# Patient Record
Sex: Male | Born: 1950
Health system: Southern US, Community
[De-identification: ages and names within clinical notes are randomized; demographics above are authoritative.]

## PROBLEM LIST (undated history)

## (undated) ENCOUNTER — Emergency Department (HOSPITAL_COMMUNITY): Admission: EM | Payer: Medicaid Other | Source: Home / Self Care

## (undated) DIAGNOSIS — C61 Malignant neoplasm of prostate: Secondary | ICD-10-CM

## (undated) DIAGNOSIS — Z87442 Personal history of urinary calculi: Secondary | ICD-10-CM

## (undated) DIAGNOSIS — I4891 Unspecified atrial fibrillation: Secondary | ICD-10-CM

## (undated) DIAGNOSIS — T8859XA Other complications of anesthesia, initial encounter: Secondary | ICD-10-CM

## (undated) DIAGNOSIS — I639 Cerebral infarction, unspecified: Secondary | ICD-10-CM

## (undated) DIAGNOSIS — I10 Essential (primary) hypertension: Secondary | ICD-10-CM

## (undated) DIAGNOSIS — K635 Polyp of colon: Secondary | ICD-10-CM

## (undated) DIAGNOSIS — M1990 Unspecified osteoarthritis, unspecified site: Secondary | ICD-10-CM

## (undated) DIAGNOSIS — N200 Calculus of kidney: Secondary | ICD-10-CM

## (undated) DIAGNOSIS — F17200 Nicotine dependence, unspecified, uncomplicated: Secondary | ICD-10-CM

## (undated) DIAGNOSIS — B1920 Unspecified viral hepatitis C without hepatic coma: Secondary | ICD-10-CM

## (undated) DIAGNOSIS — K7460 Unspecified cirrhosis of liver: Secondary | ICD-10-CM

## (undated) DIAGNOSIS — E669 Obesity, unspecified: Secondary | ICD-10-CM

## (undated) DIAGNOSIS — M170 Bilateral primary osteoarthritis of knee: Secondary | ICD-10-CM

## (undated) DIAGNOSIS — E119 Type 2 diabetes mellitus without complications: Secondary | ICD-10-CM

## (undated) DIAGNOSIS — N400 Enlarged prostate without lower urinary tract symptoms: Secondary | ICD-10-CM

## (undated) HISTORY — PX: COLONOSCOPY: SHX174

## (undated) HISTORY — DX: Bilateral primary osteoarthritis of knee: M17.0

## (undated) HISTORY — DX: Benign prostatic hyperplasia without lower urinary tract symptoms: N40.0

## (undated) HISTORY — DX: Nicotine dependence, unspecified, uncomplicated: F17.200

## (undated) HISTORY — PX: KNEE SURGERY: SHX244

## (undated) HISTORY — DX: Calculus of kidney: N20.0

## (undated) HISTORY — DX: Polyp of colon: K63.5

## (undated) HISTORY — PX: ESOPHAGOGASTRODUODENOSCOPY: SHX1529

## (undated) HISTORY — PX: KIDNEY STONE SURGERY: SHX686

## (undated) HISTORY — DX: Obesity, unspecified: E66.9

## (undated) HISTORY — PX: OTHER SURGICAL HISTORY: SHX169

## (undated) HISTORY — PX: WISDOM TOOTH EXTRACTION: SHX21

## (undated) HISTORY — PX: PILONIDAL CYST EXCISION: SHX744

---

## 1984-07-25 HISTORY — PX: LITHOTRIPSY: SUR834

## 1999-10-11 ENCOUNTER — Emergency Department (HOSPITAL_COMMUNITY): Admission: EM | Admit: 1999-10-11 | Discharge: 1999-10-11 | Payer: Self-pay | Admitting: *Deleted

## 2000-07-05 ENCOUNTER — Emergency Department (HOSPITAL_COMMUNITY): Admission: EM | Admit: 2000-07-05 | Discharge: 2000-07-05 | Payer: Self-pay | Admitting: Emergency Medicine

## 2001-02-04 ENCOUNTER — Emergency Department (HOSPITAL_COMMUNITY): Admission: EM | Admit: 2001-02-04 | Discharge: 2001-02-04 | Payer: Self-pay

## 2013-10-23 ENCOUNTER — Emergency Department (HOSPITAL_COMMUNITY)
Admission: EM | Admit: 2013-10-23 | Discharge: 2013-10-23 | Disposition: A | Discharge status: Home / Self Care | Payer: Medicaid Other | Attending: Emergency Medicine | Admitting: Emergency Medicine

## 2013-10-23 ENCOUNTER — Encounter (HOSPITAL_COMMUNITY): Payer: Self-pay | Admitting: Emergency Medicine

## 2013-10-23 DIAGNOSIS — E119 Type 2 diabetes mellitus without complications: Secondary | ICD-10-CM | POA: Insufficient documentation

## 2013-10-23 DIAGNOSIS — I10 Essential (primary) hypertension: Secondary | ICD-10-CM | POA: Insufficient documentation

## 2013-10-23 DIAGNOSIS — M25561 Pain in right knee: Secondary | ICD-10-CM

## 2013-10-23 DIAGNOSIS — F17200 Nicotine dependence, unspecified, uncomplicated: Secondary | ICD-10-CM | POA: Insufficient documentation

## 2013-10-23 DIAGNOSIS — Z9889 Other specified postprocedural states: Secondary | ICD-10-CM | POA: Insufficient documentation

## 2013-10-23 DIAGNOSIS — M25569 Pain in unspecified knee: Secondary | ICD-10-CM | POA: Insufficient documentation

## 2013-10-23 DIAGNOSIS — M25562 Pain in left knee: Secondary | ICD-10-CM

## 2013-10-23 DIAGNOSIS — M25469 Effusion, unspecified knee: Secondary | ICD-10-CM | POA: Insufficient documentation

## 2013-10-23 DIAGNOSIS — G8929 Other chronic pain: Secondary | ICD-10-CM | POA: Insufficient documentation

## 2013-10-23 HISTORY — DX: Type 2 diabetes mellitus without complications: E11.9

## 2013-10-23 HISTORY — DX: Essential (primary) hypertension: I10

## 2013-10-23 MED ORDER — MELOXICAM 7.5 MG PO TABS: 7.5000 mg | ORAL_TABLET | Freq: Every day | ORAL | Status: DC

## 2013-10-23 MED ORDER — HYDROCODONE-ACETAMINOPHEN 5-325 MG PO TABS: 1.0000 | ORAL_TABLET | Freq: Every evening | ORAL | Status: DC | PRN

## 2013-10-23 NOTE — Discharge Instructions (Signed)
Read the information below.  Use the prescribed medication as directed.  Please discuss all new medications with your pharmacist.  Do not take additional tylenol while taking the prescribed pain medication to avoid overdose.  You may return to the Emergency Department at any time for worsening condition or any new symptoms that concern you.  If you develop uncontrolled pain, weakness or numbness of the extremity, severe discoloration of the skin, or you are unable to walk, return to the ER for a recheck.      Knee Pain Knee pain can be a result of an injury or other medical conditions. Treatment will depend on the cause of your pain. HOME CARE  Only take medicine as told by your doctor.  Keep a healthy weight. Being overweight can make the knee hurt more.  Stretch before exercising or playing sports.  If there is constant knee pain, change the way you exercise. Ask your doctor for advice.  Make sure shoes fit well. Choose the right shoe for the sport or activity.  Protect your knees. Wear kneepads if needed.  Rest when you are tired. GET HELP RIGHT AWAY IF:   Your knee pain does not stop.  Your knee pain does not get better.  Your knee joint feels hot to the touch.  You have a fever. MAKE SURE YOU:   Understand these instructions.  Will watch this condition.  Will get help right away if you are not doing well or get worse. Document Released: 10/07/2008 Document Revised: 10/03/2011 Document Reviewed: 10/07/2008 [facility] Patient Information 2014 [place], [place].  Chronic Pain Chronic pain can be defined as pain that is off and on and lasts for 3 6 months or longer. Many things cause chronic pain, which can make it difficult to make a diagnosis. There are many treatment options available for chronic pain. However, finding a treatment that works well for you may require trying various approaches until the right one is found. Many people benefit from a combination of two or more  types of treatment to control their pain. SYMPTOMS  Chronic pain can occur anywhere in the body and can range from mild to very severe. Some types of chronic pain include:  Headache.  Low back pain.  Cancer pain.  Arthritis pain.  Neurogenic pain. This is pain resulting from damage to nerves. People with chronic pain may also have other symptoms such as:  Depression.  Anger.  Insomnia.  Anxiety. DIAGNOSIS  Your health care provider will help diagnose your condition over time. In many cases, the initial focus will be on excluding possible conditions that could be causing the pain. Depending on your symptoms, your health care provider may order tests to diagnose your condition. Some of these tests may include:   Blood tests.   CT scan.   MRI.   X-rays.   Ultrasounds.   Nerve conduction studies.  You may need to see a specialist.  TREATMENT  Finding treatment that works well may take time. You may be referred to a pain specialist. He or she may prescribe medicine or therapies, such as:   Mindful meditation or yoga.  Shots (injections) of numbing or pain-relieving medicines into the spine or area of pain.  Local electrical stimulation.  Acupuncture.   Massage therapy.   Aroma, color, light, or sound therapy.   Biofeedback.   Working with a physical therapist to keep from getting stiff.   Regular, gentle exercise.   Cognitive or behavioral therapy.   Group support.  Sometimes, surgery may be recommended.  HOME CARE INSTRUCTIONS   Take all medicines as directed by your health care provider.   Lessen stress in your life by relaxing and doing things such as listening to calming music.   Exercise or be active as directed by your health care provider.   Eat a healthy diet and include things such as vegetables, fruits, fish, and lean meats in your diet.   Keep all follow-up appointments with your health care provider.   Attend a  support group with others suffering from chronic pain. SEEK MEDICAL CARE IF:   Your pain gets worse.   You develop a new pain that was not there before.   You cannot tolerate medicines given to you by your health care provider.   You have new symptoms since your last visit with your health care provider.  SEEK IMMEDIATE MEDICAL CARE IF:   You feel weak.   You have decreased sensation or numbness.   You lose control of bowel or bladder function.   Your pain suddenly gets much worse.   You develop shaking.  You develop chills.  You develop confusion.  You develop chest pain.  You develop shortness of breath.  MAKE SURE YOU:  Understand these instructions.  Will watch your condition.  Will get help right away if you are not doing well or get worse. Document Released: 04/02/2002 Document Revised: 03/13/2013 Document Reviewed: 01/04/2013 [facility] Patient Information 2014 [place].   Emergency Department Resource Guide 1) Find a Doctor and Pay Out of Pocket Although you won't have to find out who is covered by your insurance plan, it is a good idea to ask around and get recommendations. You will then need to call the office and see if the doctor you have chosen will accept you as a new patient and what types of options they offer for patients who are self-pay. Some doctors offer discounts or will set up payment plans for their patients who do not have insurance, but you will need to ask so you aren't surprised when you get to your appointment.  2) Contact Your Local Health Department Not all health departments have doctors that can see patients for sick visits, but many do, so it is worth a call to see if yours does. If you don't know where your local health department is, you can check in your phone book. The CDC also has a tool to help you locate your state's health department, and many state websites also have listings of all of their local health  departments.  3) Find a [facility] If your illness is not likely to be very severe or complicated, you may want to try a walk in clinic. These are popping up all over the country in pharmacies, drugstores, and shopping centers. They're usually staffed by nurse practitioners or physician assistants that have been trained to treat common illnesses and complaints. They're usually fairly quick and inexpensive. However, if you have serious medical issues or chronic medical problems, these are probably not your best option.  No Primary Care Doctor: - Call Health Connect at  [phone] - they can help you locate a primary care doctor that  accepts your insurance, provides certain services, etc. - Physician Referral Service- [phone]  Chronic Pain Problems: Organization         Address  Phone   Notes  [facility]  [phone] Patients need to be referred by their primary care doctor.  Medication Assistance: Organization         Address  Phone   Notes  [facility] Medication [facility] [address] [place], [place] [address] [phone] --Must be a resident of [facility] -- Must have NO insurance coverage whatsoever (no Medicaid/ Medicare, etc.) -- The pt. MUST have a primary care doctor that directs their care regularly and follows them in the community   MedAssist  [phone]   [employer]  [phone]    Agencies that provide inexpensive medical care: Organization         Address  Phone   Notes  [place]  [phone]   [facility]    [phone]   [facility] [place], [place] [address] [phone]   [place] [address] [place] [phone]   Planned Parenthood    [phone]   [facility]    [phone]   [place] and [address], Jameica Couts [place] Phone:  [phone], Fax:  [phone] Hours of Operation:  9 am - 6 pm, M-F.  Also accepts Medicaid/Medicare and self-pay.  [facility] for [place], [address], [place] Phone: [phone], Fax: [phone]. Hours of Operation:  8:30 am - 5:30 pm, M-F.  Also accepts Medicaid and self-pay.  [facility] [address], [place] Phone: [phone]   [place], [place], [place] [phone], Ext. [phone] Mondays & Thursdays: 7-9 AM.  First 15 patients are seen on a first come, first serve basis.    [place] Providers:  Organization         Address  Phone   Notes  [facility] [address], [place] [phone] Also accepts self-pay patients.  [facility] [address], [place]  [phone]   [place], [address], [place] [phone]   [facility] [address] [place] [phone]   Lucianne Lei [address], [place]   [phone] Only accepts [place] Access [place] patients after they have their name applied to their card.   Self-Pay (no insurance) in [facility]:  Organization         Address  Phone   Notes  Sickle Cell Patients, [facility] [place] [phone]   [facility] [place] [phone]   [facility] [place]  [place], [place], [place] [phone]   [facility]/Dr. Osei-Bonsu  [address], [place] or [address], [place] [phone] Phone number for both [place] and [place] locations is the same.  Urgent Medical and [facility] [address], [place] [phone]   [facility] [address], [place] or [address] [phone]   [facility] [address], [place] [phone], phone; [phone], fax Sees patients 1st and 3rd Saturday of every month.  Must not qualify for public or private insurance (i.e. Medicaid, Medicare, [place] Health Choice, Veterans' Benefits)  Household income should be no more than 200% of the poverty level The  clinic cannot treat you if you are pregnant or think you are pregnant  Sexually transmitted diseases are not treated at the clinic.    Dental Care: Organization         Address  Phone  Notes  [facility] Department of [facility] [place] [phone] Accepts children up to age 107 who are enrolled in [place] or [place]; pregnant women with a Medicaid card; and children who have applied for Medicaid or [place] Health Choice, but were declined, whose parents can pay a reduced fee at time of service.  [facility] Department of [facility]  [address], [place] [phone] Accepts children up to age 82 who are enrolled in [place] or [place]; pregnant women with a Medicaid card; and children who have applied for Medicaid or [place] Health Choice, but were declined, whose parents can pay a reduced fee at time of service.  [facility] Adult [facility] Access PROGRAM  [place] [phone] Patients are seen by appointment only. Walk-ins are not accepted. [place] will see patients 16 years of age and older. Monday - Tuesday (8am-5pm) Most Wednesdays (8:30-5pm) $30 per visit, cash only  [facility] Adult [facility] Access PROGRAM  [address], [facility] [phone] Patients are seen by appointment only. Walk-ins are not accepted. [place] will see patients 43 years of age and older. One Wednesday Evening (Monthly: Volunteer Based).  $30 per visit, cash only  [place]  [phone] for adults;  Children under age 31, call Graduate Pediatric Dentistry at [phone]. Children aged 56-14, please call [phone] to request a pediatric application.  Dental services are provided in all areas of dental care including fillings, crowns and bridges, complete and partial dentures, implants, gum treatment, root canals, and extractions. Preventive care is also provided. Treatment is provided to both adults and children. Patients are selected via a lottery and there is often a waiting list.   [facility] [address], [place]  [phone] [email or web address]   [facility] [address] [place], [place] [phone], Ext. [phone] Second and Fourth Thursday of each month, opens at 6:30 AM; Clinic ends at 9 AM.  Patients are seen on a first-come first-served basis, and a limited number are seen during each clinic.   [facility]  [address] Hillard Danker [place], [place] [phone]   Eligibility Requirements You must have lived in [place], [place], or [place] counties for at least the last three months.   You cannot be eligible for state or federal sponsored [employer], including [employer], [place], or [employer].   You generally cannot be eligible for healthcare insurance through your employer.    How to apply: Eligibility screenings are held every Tuesday and Wednesday afternoon from 1:00 pm until 4:00 pm. You do not need an appointment for the interview!  [facility] [address], [place], [place]   [place]  [place] Department  [place]  [phone]    Behavioral Health Resources in the Community: Intensive Outpatient Programs Organization         Address  Phone  Notes  [facility] [place]. [address], [place], [place] [phone]   [facility]patient [address], [place],  [place] [phone]   ADS: Alcohol & Drug Svcs [address], [place], [place]   [place] [address]  [place], [place] or [phone]   Substance Abuse Resources Organization         Address  Phone  Notes  Alcohol and Drug Services  [phone]   [place]  [phone]   [facility]   Chinita Pester  [phone]   Residential & Outpatient Substance Abuse Program  [phone]   Psychological Services Organization         Address  Phone  Notes  [facility]  [place]  [phone]   [place] [address], [place] or [phone]    Mobile Crisis Teams Organization         Address  Phone  Notes  [facility], Mobile Crisis Care Unit  [phone]   [facility]otherapeutic [facility]  [address] [place], [place]   [place] [address], [place] [phone]    Self-Help/Support Groups Organization         Address  Phone             Notes  [place]. of [place] - variety of support groups  [place] Call for more information  Narcotics Anonymous (NA), [facility] [address], [employer] [place]  2 meetings at this location   Special educational needs teacher         Address  Phone  Notes  [facility] Residential Treatment [place],    [place]  1-2677239064   [facility]  [address], [place] [id number], [place], [place]   [place], [place] [phone] Admissions: 8am-3pm M-F  Incentives Substance [place] [address]    [place], [place] [phone]   [facility] [address] [place], [place], [place]   [facility] [address]  [place], [place]   [facility]ight Programs - Intensive  Outpatient [address] Kristeen [address], [place], [place]   Community [facility] ( [address] [place].,  [place], [place] [phone] or [phone]   [facility] (RTS) [address] [place], [place] Accepts Medicaid  [facility] [place] [address],  [place] [phone] Substance Abuse/Addiction Treatment   [facility] Organization         Address  Phone  Notes  [facility]  [phone]   Domenic Schwab, PhD [address] Arlis Porta [place], [place]   [phone] or [phone]   [place], [place] [phone]   [facility] [address], [place], [place] [phone] Insurance/Medicaid/sponsorship through [facility] and Families [address]                                    [place], [place] [phone] [place] [address], [place] [phone]    Dr. Adele Schilder  [phone]   [facility] Dept. 1) [address] [place] 2) [place] 3)  [address], [place] [phone]  (  [place] [phone] or [phone] (After Hours)

## 2013-10-23 NOTE — ED Notes (Signed)
Pt had right knee surgery in 1986 per Dr. Carloyn Manner. Now c/o bilateral knee pain, " it's getting worse and worse...sometime it makes me holler". Right knee slightly swollen.

## 2013-10-23 NOTE — ED Notes (Signed)
Bilateral knee pain for months has gotten worse states was told in his forties that he would need need knee replacement but has not had it dr that follows [place]

## 2013-10-23 NOTE — ED Provider Notes (Signed)
CSN: [id number]     Arrival date & time 10/23/13  1213 History   First MD Initiated Contact with Patient 10/23/13 1328     Chief Complaint  Patient presents with  . Knee Pain     (Consider location/radiation/quality/duration/timing/severity/associated sxs/prior Treatment) The history is provided by the patient.    Patient presents with bilateral knee pain, R>L, and right knee swelling.  Pt states that in 1986 he "tore up" his right knee and had surgery on it, was told at the time he would have bad arthritis in both knees by the time he was 42.  Now he has had 2.5 months of gradually worsening pain, exacerbated by activity, and keeping him awake at night.  Pain is described as tightness and aching with occasional sharp stabbing pains.  It is worse after prolonged activity.  Denies any new injury.  Has tried aleve and naproxen without improvement.  Denies fevers, weakness or numbness of the extremities, back pain.    Past Medical History  Diagnosis Date  . Hypertension   . Diabetes mellitus without complication    History reviewed. No pertinent past surgical history. No family history on file. History  Substance Use Topics  . Smoking status: Current Every Day Smoker  . Smokeless tobacco: Not on file  . Alcohol Use: Yes    Review of Systems  Constitutional: Negative for fever.  Respiratory: Negative for shortness of breath.   Cardiovascular: Negative for chest pain.  Musculoskeletal: Positive for arthralgias. Negative for back pain.  Skin: Negative for color change.  Neurological: Negative for weakness and numbness.  All other systems reviewed and are negative.      Allergies  Review of patient's allergies indicates no known allergies.  Home Medications   Current Outpatient Rx  Name  Route  Sig  Dispense  Refill  . HYDROcodone-acetaminophen (NORCO/VICODIN) 5-325 MG per tablet   Oral   Take 1 tablet by mouth at bedtime as needed for moderate pain or severe pain.   10  tablet   0   . meloxicam (MOBIC) 7.5 MG tablet   Oral   Take 1 tablet (7.5 mg total) by mouth daily.   20 tablet   0    BP 160/105  Pulse 86  Temp(Src) 98.4 F (36.9 C) (Oral)  Resp 18  SpO2 100% Physical Exam  Nursing note and vitals reviewed. Constitutional: He appears well-developed and well-nourished. No distress.  HENT:  Head: Normocephalic and atraumatic.  Neck: Neck supple.  Pulmonary/Chest: Effort normal.  Musculoskeletal:       Right knee: He exhibits swelling. He exhibits normal range of motion, no effusion, no ecchymosis, no deformity, no laceration, no erythema, normal alignment, no LCL laxity and no MCL laxity. No tenderness found.       Left knee: Normal.  Neurological: He is alert.  Skin: He is not diaphoretic.    ED Course  Procedures (including critical care time) Labs Review Labs Reviewed - No data to display Imaging Review No results found.   EKG Interpretation None      MDM   Final diagnoses:  Bilateral chronic knee pain    Pt with gradually worsening bilateral knee pain, R>L.  No injury.  No fever.  No red flags. Doubt septic joint, doubt gout.  Suspect degenerative changes/ OA.   D/C home with pain medication, orthopedic follow up.  Discussed findings, treatment, and follow up  with patient.  Pt given return precautions.  Pt verbalizes understanding and agrees with  plan.        Clayton Bibles, PA-C 10/23/13 1630

## 2013-10-24 NOTE — Discharge Planning (Signed)
E1EO Darlyne Russian, Community Liaison  Bradley Hull is [facility] by [facility] as a patient on a primary care basis. Patient went to the practice before coming to the ED but the practice was closed. I did reschedule the patients appointment at the clinic to enroll for his orange card. Appointment made for April 9,2015 at 10:00 am. Patient is aware of this appointment. My contact information was provided for any future questions or concerns.

## 2013-10-25 NOTE — ED Provider Notes (Signed)
Medical screening examination/treatment/procedure(s) were performed by non-physician practitioner and as supervising physician I was immediately available for consultation/collaboration.   EKG Interpretation None        Mervin Kung, MD 10/25/13 1331

## 2013-11-14 DIAGNOSIS — B182 Chronic viral hepatitis C: Secondary | ICD-10-CM | POA: Insufficient documentation

## 2013-12-02 DIAGNOSIS — E669 Obesity, unspecified: Secondary | ICD-10-CM | POA: Insufficient documentation

## 2013-12-02 DIAGNOSIS — F1010 Alcohol abuse, uncomplicated: Secondary | ICD-10-CM | POA: Insufficient documentation

## 2014-07-25 HISTORY — PX: ESOPHAGOGASTRODUODENOSCOPY: SHX1529

## 2015-10-11 ENCOUNTER — Encounter (HOSPITAL_COMMUNITY): Payer: Self-pay | Admitting: Adult Health

## 2015-10-11 ENCOUNTER — Emergency Department (HOSPITAL_COMMUNITY): Payer: Medicaid Other

## 2015-10-11 ENCOUNTER — Emergency Department (HOSPITAL_COMMUNITY)
Admission: EM | Admit: 2015-10-11 | Discharge: 2015-10-11 | Disposition: A | Discharge status: Home / Self Care | Payer: Medicaid Other | Attending: Emergency Medicine | Admitting: Emergency Medicine

## 2015-10-11 DIAGNOSIS — R1011 Right upper quadrant pain: Secondary | ICD-10-CM | POA: Diagnosis present

## 2015-10-11 DIAGNOSIS — I10 Essential (primary) hypertension: Secondary | ICD-10-CM | POA: Insufficient documentation

## 2015-10-11 DIAGNOSIS — Z79899 Other long term (current) drug therapy: Secondary | ICD-10-CM | POA: Insufficient documentation

## 2015-10-11 DIAGNOSIS — M1990 Unspecified osteoarthritis, unspecified site: Secondary | ICD-10-CM | POA: Diagnosis not present

## 2015-10-11 DIAGNOSIS — E119 Type 2 diabetes mellitus without complications: Secondary | ICD-10-CM | POA: Insufficient documentation

## 2015-10-11 DIAGNOSIS — N200 Calculus of kidney: Secondary | ICD-10-CM | POA: Diagnosis not present

## 2015-10-11 DIAGNOSIS — R109 Unspecified abdominal pain: Secondary | ICD-10-CM

## 2015-10-11 DIAGNOSIS — Z8619 Personal history of other infectious and parasitic diseases: Secondary | ICD-10-CM | POA: Diagnosis not present

## 2015-10-11 DIAGNOSIS — F17210 Nicotine dependence, cigarettes, uncomplicated: Secondary | ICD-10-CM | POA: Diagnosis not present

## 2015-10-11 HISTORY — DX: Unspecified cirrhosis of liver: K74.60

## 2015-10-11 HISTORY — DX: Unspecified osteoarthritis, unspecified site: M19.90

## 2015-10-11 HISTORY — DX: Unspecified viral hepatitis C without hepatic coma: B19.20

## 2015-10-11 LAB — COMPREHENSIVE METABOLIC PANEL
ALBUMIN: 3.6 g/dL (ref 3.5–5.0)
ALK PHOS: 76 U/L (ref 38–126)
ALT: 30 U/L (ref 17–63)
ANION GAP: 14 (ref 5–15)
AST: 43 U/L — AB (ref 15–41)
BILIRUBIN TOTAL: 0.9 mg/dL (ref 0.3–1.2)
BUN: 6 mg/dL (ref 6–20)
CALCIUM: 9.1 mg/dL (ref 8.9–10.3)
CO2: 23 mmol/L (ref 22–32)
Chloride: 107 mmol/L (ref 101–111)
Creatinine, Ser: 0.75 mg/dL (ref 0.61–1.24)
GFR calc Af Amer: >60 mL/min (ref >60)
GFR calc non Af Amer: >60 mL/min (ref >60)
GLUCOSE: 190 mg/dL — AB (ref 65–99)
POTASSIUM: 3.9 mmol/L (ref 3.5–5.1)
SODIUM: 144 mmol/L (ref 135–145)
Total Protein: 7.3 g/dL (ref 6.5–8.1)

## 2015-10-11 LAB — URINALYSIS, ROUTINE W REFLEX MICROSCOPIC
BILIRUBIN URINE: NEGATIVE
GLUCOSE, UA: NEGATIVE mg/dL
KETONES UR: 15 mg/dL — AB
Leukocytes, UA: NEGATIVE
Nitrite: NEGATIVE
PH: 5.5 (ref 5.0–8.0)
Protein, ur: NEGATIVE mg/dL
SPECIFIC GRAVITY, URINE: 1.015 (ref 1.005–1.030)

## 2015-10-11 LAB — CBC
HEMATOCRIT: 43.6 % (ref 39.0–52.0)
HEMOGLOBIN: 14.7 g/dL (ref 13.0–17.0)
MCH: 29.2 pg (ref 26.0–34.0)
MCHC: 33.7 g/dL (ref 30.0–36.0)
MCV: 86.5 fL (ref 78.0–100.0)
Platelets: 234 10*3/uL (ref 150–400)
RBC: 5.04 MIL/uL (ref 4.22–5.81)
RDW: 15.5 % (ref 11.5–15.5)
WBC: 6.6 10*3/uL (ref 4.0–10.5)

## 2015-10-11 LAB — URINE MICROSCOPIC-ADD ON

## 2015-10-11 LAB — LIPASE, BLOOD: Lipase: 30 U/L (ref 11–51)

## 2015-10-11 LAB — CBG MONITORING, ED: Glucose-Capillary: 184 mg/dL — ABNORMAL HIGH (ref 65–99)

## 2015-10-11 MED ORDER — SODIUM CHLORIDE 0.9 % IV BOLUS (SEPSIS): 500.0000 mL | Freq: Once | INTRAVENOUS | Status: DC

## 2015-10-11 MED ORDER — KETOROLAC TROMETHAMINE 15 MG/ML IJ SOLN
15.0000 mg | Freq: Once | INTRAMUSCULAR | Status: AC
  Administered 2015-10-11: 15 mg via INTRAVENOUS
  Filled 2015-10-11: qty 1

## 2015-10-11 MED ORDER — HYDROMORPHONE HCL 1 MG/ML IJ SOLN
1.0000 mg | Freq: Once | INTRAMUSCULAR | Status: AC
  Administered 2015-10-11: 1 mg via INTRAMUSCULAR
  Filled 2015-10-11: qty 1

## 2015-10-11 MED ORDER — ONDANSETRON 4 MG PO TBDP
4.0000 mg | ORAL_TABLET | Freq: Once | ORAL | Status: AC
  Administered 2015-10-11: 4 mg via ORAL
  Filled 2015-10-11: qty 1

## 2015-10-11 MED ORDER — ONDANSETRON HCL 4 MG/2ML IJ SOLN: 4.0000 mg | Freq: Once | INTRAMUSCULAR | Status: DC

## 2015-10-11 MED ORDER — HYDROMORPHONE HCL 1 MG/ML IJ SOLN: 1.0000 mg | Freq: Once | INTRAMUSCULAR | Status: DC

## 2015-10-11 MED ORDER — MORPHINE SULFATE 15 MG PO TABS: 15.0000 mg | ORAL_TABLET | Freq: Four times a day (QID) | ORAL | Status: DC | PRN

## 2015-10-11 NOTE — Discharge Instructions (Signed)
Take ibuprofen 600 mg every 6 hrs. If pain severe and only if needed take narcotics.  Stay hydrated with water.  If you were given medicines take as directed.  If you are on coumadin or contraceptives realize their levels and effectiveness is altered by many different medicines.  If you have any reaction (rash, tongues swelling, other) to the medicines stop taking and see a physician.    If your blood pressure was elevated in the ER make sure you follow up for management with a primary doctor or return for chest pain, shortness of breath or stroke symptoms.  Please follow up as directed and return to the ER or see a physician for new or worsening symptoms.  Thank you. Filed Vitals:   10/11/15 1154 10/11/15 1600  BP: 166/100 169/110  Pulse: 90 81  Temp: 98.1 F (36.7 C)   TempSrc: Oral   Resp: 20   Weight: 235 lb 5 oz (106.737 kg)   SpO2: 96% 98%

## 2015-10-11 NOTE — ED Notes (Signed)
Presents with right flank, upper right abdominal pain began a while ago "I am in stage 4 cirrhosis of liver and I used to be a IV drug user. This episode of paiin and vomiting and diarrhea started last night" denies blood and tarry black stools. Thrown up 4-5 times and diarrhea is ongoing for a while. Able to drink okay and hold down fluids.  "I am just hurting too bad and I can't lay down and relax because of the pain"

## 2015-10-11 NOTE — ED Provider Notes (Signed)
CSN: [id number]     Arrival date & time 10/11/15  1109 History   First MD Initiated Contact with Patient 10/11/15 1500     Chief Complaint  Patient presents with  . Abdominal Pain     (Consider location/radiation/quality/duration/timing/severity/associated sxs/prior Treatment) HPI Comments: 65 year old male with history of hepatitis C, cirrhosis stage IV, diabetes presents with worsening right upper abdominal pain for the past 2 months. Worsening the past 2 days with vomiting and diarrhea nonbloody nonbilious. No significant sick contacts. History of IV drug use. Intermittent alcohol use. Patient's follow-up with gastroenterology outpatient. Last endoscopy approximately one year ago denies varices history. No abdominal surgery history. Pain fairly constant.  Patient is a 65 y.o. male presenting with abdominal pain. The history is provided by the patient.  Abdominal Pain Associated symptoms: diarrhea, nausea and vomiting   Associated symptoms: no chest pain, no chills, no dysuria, no fever and no shortness of breath     Past Medical History  Diagnosis Date  . Hypertension   . Diabetes mellitus without complication ([place])   . Cirrhosis ([place])   . Hepatitis C   . Arthritis    History reviewed. No pertinent past surgical history. History reviewed. No pertinent family history. Social History  Substance Use Topics  . Smoking status: Current Every Day Smoker    Types: Cigarettes  . Smokeless tobacco: None  . Alcohol Use: Yes    Review of Systems  Constitutional: Positive for appetite change. Negative for fever and chills.  HENT: Negative for congestion.   Eyes: Negative for visual disturbance.  Respiratory: Negative for shortness of breath.   Cardiovascular: Negative for chest pain.  Gastrointestinal: Positive for nausea, vomiting, abdominal pain and diarrhea.  Genitourinary: Negative for dysuria and flank pain.  Musculoskeletal: Negative for back pain, neck pain and neck  stiffness.  Skin: Negative for rash.  Neurological: Negative for light-headedness and headaches.      Allergies  Review of patient's allergies indicates no known allergies.  Home Medications   Prior to Admission medications   Medication Sig Start Date End Date Taking? Authorizing Provider  lisinopril-hydrochlorothiazide (PRINZIDE,ZESTORETIC) 20-25 MG tablet Take 1 tablet by mouth daily.   Yes Historical Provider, MD  sitaGLIPtin-metformin (JANUMET) 50-500 MG tablet Take 1 tablet by mouth daily.   Yes Historical Provider, MD  HYDROcodone-acetaminophen (NORCO/VICODIN) 5-325 MG per tablet Take 1 tablet by mouth at bedtime as needed for moderate pain or severe pain. 10/23/13   Clayton Bibles, PA-C  meloxicam (MOBIC) 7.5 MG tablet Take 1 tablet (7.5 mg total) by mouth daily. 10/23/13   Clayton Bibles, PA-C  morphine (MSIR) 15 MG tablet Take 1 tablet (15 mg total) by mouth every 6 (six) hours as needed for severe pain. 10/11/15   Elnora Morrison, MD   BP 169/110 mmHg  Pulse 81  Temp(Src) 98.1 F (36.7 C) (Oral)  Resp 20  Wt 235 lb 5 oz (106.737 kg)  SpO2 98% Physical Exam  Constitutional: He is oriented to person, place, and time. He appears well-developed and well-nourished.  HENT:  Head: Normocephalic and atraumatic.  Eyes: Conjunctivae are normal. Right eye exhibits no discharge. Left eye exhibits no discharge.  Neck: Normal range of motion. Neck supple. No tracheal deviation present.  Cardiovascular: Normal rate and regular rhythm.   Pulmonary/Chest: Effort normal and breath sounds normal.  Abdominal: Soft. He exhibits no distension (no ascites appreciated). There is tenderness (RUQ). There is no guarding.  Musculoskeletal: He exhibits no edema.  Neurological: He is alert and  oriented to person, place, and time.  Skin: Skin is warm. No rash noted.  Psychiatric: He has a normal mood and affect.  Nursing note and vitals reviewed.   ED Course  Procedures (including critical care time) Labs  Review Labs Reviewed  COMPREHENSIVE METABOLIC PANEL - Abnormal; Notable for the following:    Glucose, Bld 190 (*)    AST 43 (*)    All other components within normal limits  URINALYSIS, ROUTINE W REFLEX MICROSCOPIC (NOT AT [facility]) - Abnormal; Notable for the following:    APPearance CLOUDY (*)    Hgb urine dipstick LARGE (*)    Ketones, ur 15 (*)    All other components within normal limits  URINE MICROSCOPIC-ADD ON - Abnormal; Notable for the following:    Squamous Epithelial / LPF 0-5 (*)    Bacteria, UA FEW (*)    All other components within normal limits  CBG MONITORING, ED - Abnormal; Notable for the following:    Glucose-Capillary 184 (*)    All other components within normal limits  LIPASE, BLOOD  CBC    Imaging Review Ct Renal Stone Study  10/11/2015  CLINICAL DATA:  Right flank pain.  Cirrhosis. EXAM: CT ABDOMEN AND PELVIS WITHOUT CONTRAST TECHNIQUE: Multidetector CT imaging of the abdomen and pelvis was performed following the standard protocol without IV contrast. COMPARISON:  None. FINDINGS: Lower chest:  Unremarkable Hepatobiliary: Diffuse steatosis. Pancreas: Unremarkable Spleen: Unremarkable Adrenals/Urinary Tract: Right hydronephrosis, hydroureter, and periureteral stranding extending down to a 6 by 4 mm right UVJ calculus. Nonobstructive right kidney lower pole calculi include a 5 mm lower pole calculus, an adjacent 2 mm calculus, and a separate 3 mm calculus. There is a 4 mm calculus in the left kidney upper pole, a 6 mm calculus in the left kidney lower pole, and an adjacent 5 mm calculus in the left kidney lower pole. Hypodense right kidney upper pole lesion, 1.1 cm on image 35 series 2. Hypodense partially exophytic 3.4 by 3.0 cm left mid kidney lesion, image 36 series 2. Stomach/Bowel: Appendix normal. Wall thickening in the lower rectum, probably incidental but technically nonspecific. Vascular/Lymphatic: Aortoiliac atherosclerotic vascular disease. Reproductive:  Enlarged prostate gland 5.5 by 4.7 cm, image 87 series 2. Other: No supplemental non-categorized findings. Musculoskeletal: Lipoma posterior to the right ribcage along the paraspinal musculature, 10.1 by 3.5 by 4.1 cm on image 17 series 2. Lumbar spondylosis and degenerative disc disease with short pedicles and prominent epidural adipose tissue, with impingement at the L2- 3, L3-4, and L4-5 levels. Small sclerotic lesion in the right intertrochanteric region, likely incidental. IMPRESSION: 1. Obstructive 6 by 4 mm right UVJ calculus causing right hydroureter and mild right hydronephrosis. There are also bilateral nonobstructive renal calculi. 2. Bilateral hypodense renal lesions, likely cysts but technically nonspecific. 3. There is some wall thickening in the rectum which is probably incidental. If the patient is having any rectal bleeding then further workup would be suggested. 4. Hepatic steatosis. 5. Enlarged prostate gland. 6.  Aortoiliac atherosclerotic vascular disease. 7. Lumbar spondylosis and degenerative disc disease along with short pedicles, causing impingement at L2- 3, L3-4, and L4-5. Electronically Signed   By: Van Clines M.D.   On: 10/11/2015 17:49   I have personally reviewed and evaluated these images and lab results as part of my medical decision-making.   EKG Interpretation None      MDM   Final diagnoses:  Right kidney stone  Acute right flank pain   Patient presents with worsening  upper abdominal pain, likely related to his cirrhosis. Tender right upper quadrant exam. Normal vitals except for elevated blood pressure which is likely chronic/pain related. He has no jaundice, no vomiting in the room, no fevers. Blood work unremarkable. Plan for CT stone study as patient is had a kidney stone is wellness may feel similar. Likely plan for close outpatient follow with gastroenterology, will look for significant ascites on CT scan.  Patient's pain improved in the ER. Kidney  stone seen on CT scan. Discussed outpatient follow-up with urology. No signs of infection.  Results and differential diagnosis were discussed with the patient/parent/guardian. Xrays were independently reviewed by myself.  Close follow up outpatient was discussed, comfortable with the plan.   Medications  ketorolac (TORADOL) 15 MG/ML injection 15 mg (not administered)  HYDROmorphone (DILAUDID) injection 1 mg (1 mg Intramuscular Given 10/11/15 1758)  ondansetron (ZOFRAN-ODT) disintegrating tablet 4 mg (4 mg Oral Given 10/11/15 1758)    Filed Vitals:   10/11/15 1154 10/11/15 1600  BP: 166/100 169/110  Pulse: 90 81  Temp: 98.1 F (36.7 C)   TempSrc: Oral   Resp: 20   Weight: 235 lb 5 oz (106.737 kg)   SpO2: 96% 98%    Final diagnoses:  Right kidney stone  Acute right flank pain       Elnora Morrison, MD 10/11/15 1845

## 2016-03-01 ENCOUNTER — Other Ambulatory Visit (HOSPITAL_COMMUNITY)
Admission: RE | Admit: 2016-03-01 | Discharge: 2016-03-01 | Disposition: A | Discharge status: Home / Self Care | Payer: Medicaid Other | Source: Ambulatory Visit | Attending: Specialist | Admitting: Specialist

## 2016-03-01 DIAGNOSIS — Z029 Encounter for administrative examinations, unspecified: Secondary | ICD-10-CM | POA: Diagnosis present

## 2016-03-01 LAB — COMPREHENSIVE METABOLIC PANEL
ALBUMIN: 4 g/dL (ref 3.5–5.0)
ALT: 26 U/L (ref 17–63)
AST: 40 U/L (ref 15–41)
Alkaline Phosphatase: 72 U/L (ref 38–126)
Anion gap: 9 (ref 5–15)
BUN: 9 mg/dL (ref 6–20)
CALCIUM: 9.7 mg/dL (ref 8.9–10.3)
CHLORIDE: 106 mmol/L (ref 101–111)
CO2: 24 mmol/L (ref 22–32)
Creatinine, Ser: 0.81 mg/dL (ref 0.61–1.24)
GFR calc Af Amer: >60 mL/min (ref >60)
GFR calc non Af Amer: >60 mL/min (ref >60)
GLUCOSE: 134 mg/dL — AB (ref 65–99)
Potassium: 4.1 mmol/L (ref 3.5–5.1)
SODIUM: 139 mmol/L (ref 135–145)
TOTAL PROTEIN: 7.7 g/dL (ref 6.5–8.1)
Total Bilirubin: 1.3 mg/dL — ABNORMAL HIGH (ref 0.3–1.2)

## 2016-03-01 LAB — PSA: PSA: 1.66 ng/mL (ref 0.00–4.00)

## 2016-03-01 LAB — CBC
HCT: 45.9 % (ref 39.0–52.0)
Hemoglobin: 15.7 g/dL (ref 13.0–17.0)
MCH: 30.8 pg (ref 26.0–34.0)
MCHC: 34.2 g/dL (ref 30.0–36.0)
MCV: 90 fL (ref 78.0–100.0)
PLATELETS: 261 10*3/uL (ref 150–400)
RBC: 5.1 MIL/uL (ref 4.22–5.81)
RDW: 14.2 % (ref 11.5–15.5)
WBC: 9.4 10*3/uL (ref 4.0–10.5)

## 2016-03-01 LAB — TSH: TSH: 1.051 u[IU]/mL (ref 0.350–4.500)

## 2016-03-02 LAB — TESTOSTERONE,FREE AND TOTAL
TESTOSTERONE FREE: 6.2 pg/mL — AB (ref 6.6–18.1)
Testosterone: 360 ng/dL (ref 264–916)

## 2016-04-14 ENCOUNTER — Other Ambulatory Visit (HOSPITAL_COMMUNITY)
Admission: RE | Admit: 2016-04-14 | Discharge: 2016-04-14 | Disposition: A | Discharge status: Home / Self Care | Payer: Medicaid Other | Source: Ambulatory Visit | Attending: Specialist | Admitting: Specialist

## 2016-04-14 DIAGNOSIS — Z029 Encounter for administrative examinations, unspecified: Secondary | ICD-10-CM | POA: Insufficient documentation

## 2016-04-15 LAB — TESTOSTERONE, FREE: Testosterone, Free: 11.2 pg/mL (ref 6.6–18.1)

## 2016-04-16 LAB — MISC LABCORP TEST (SEND OUT): LABCORP TEST CODE: 4549

## 2016-04-18 LAB — MISC LABCORP TEST (SEND OUT): Labcorp test code: 504026

## 2016-09-07 ENCOUNTER — Other Ambulatory Visit: Payer: Self-pay

## 2016-09-07 ENCOUNTER — Encounter (HOSPITAL_COMMUNITY): Payer: Self-pay

## 2016-09-07 ENCOUNTER — Encounter (HOSPITAL_COMMUNITY)
Admission: RE | Admit: 2016-09-07 | Discharge: 2016-09-07 | Disposition: A | Discharge status: Home / Self Care | Payer: Medicaid Other | Source: Ambulatory Visit | Attending: Oral Surgery | Admitting: Oral Surgery

## 2016-09-07 DIAGNOSIS — E119 Type 2 diabetes mellitus without complications: Secondary | ICD-10-CM | POA: Diagnosis not present

## 2016-09-07 DIAGNOSIS — M1990 Unspecified osteoarthritis, unspecified site: Secondary | ICD-10-CM | POA: Diagnosis not present

## 2016-09-07 DIAGNOSIS — K7469 Other cirrhosis of liver: Secondary | ICD-10-CM | POA: Diagnosis not present

## 2016-09-07 DIAGNOSIS — F17200 Nicotine dependence, unspecified, uncomplicated: Secondary | ICD-10-CM | POA: Diagnosis not present

## 2016-09-07 DIAGNOSIS — N200 Calculus of kidney: Secondary | ICD-10-CM | POA: Diagnosis not present

## 2016-09-07 DIAGNOSIS — I10 Essential (primary) hypertension: Secondary | ICD-10-CM | POA: Diagnosis not present

## 2016-09-07 DIAGNOSIS — Z6838 Body mass index (BMI) 38.0-38.9, adult: Secondary | ICD-10-CM | POA: Insufficient documentation

## 2016-09-07 DIAGNOSIS — Z01812 Encounter for preprocedural laboratory examination: Secondary | ICD-10-CM | POA: Diagnosis not present

## 2016-09-07 DIAGNOSIS — Z01810 Encounter for preprocedural cardiovascular examination: Secondary | ICD-10-CM | POA: Insufficient documentation

## 2016-09-07 DIAGNOSIS — K739 Chronic hepatitis, unspecified: Secondary | ICD-10-CM | POA: Insufficient documentation

## 2016-09-07 DIAGNOSIS — E669 Obesity, unspecified: Secondary | ICD-10-CM | POA: Diagnosis not present

## 2016-09-07 HISTORY — DX: Personal history of urinary calculi: Z87.442

## 2016-09-07 LAB — CBC
HEMATOCRIT: 39.7 % (ref 39.0–52.0)
Hemoglobin: 13.4 g/dL (ref 13.0–17.0)
MCH: 29.9 pg (ref 26.0–34.0)
MCHC: 33.8 g/dL (ref 30.0–36.0)
MCV: 88.6 fL (ref 78.0–100.0)
Platelets: 261 10*3/uL (ref 150–400)
RBC: 4.48 MIL/uL (ref 4.22–5.81)
RDW: 13.4 % (ref 11.5–15.5)
WBC: 7.4 10*3/uL (ref 4.0–10.5)

## 2016-09-07 LAB — BASIC METABOLIC PANEL
Anion gap: 10 (ref 5–15)
BUN: 5 mg/dL — AB (ref 6–20)
CHLORIDE: 107 mmol/L (ref 101–111)
CO2: 22 mmol/L (ref 22–32)
CREATININE: 0.76 mg/dL (ref 0.61–1.24)
Calcium: 9.1 mg/dL (ref 8.9–10.3)
GFR calc Af Amer: >60 mL/min (ref >60)
GFR calc non Af Amer: >60 mL/min (ref >60)
GLUCOSE: 311 mg/dL — AB (ref 65–99)
POTASSIUM: 3.9 mmol/L (ref 3.5–5.1)
SODIUM: 139 mmol/L (ref 135–145)

## 2016-09-07 LAB — GLUCOSE, CAPILLARY: Glucose-Capillary: 265 mg/dL — ABNORMAL HIGH (ref 65–99)

## 2016-09-07 NOTE — Progress Notes (Signed)
PCP is Dr Jinny Blossom Denies ever seeing a cardiologist. Denies ever having a card cath, stress test, or echo.  Reports his fasting cbgs run around 165. Today it was 265, but states he just ate.

## 2016-09-07 NOTE — H&P (Signed)
HISTORY AND PHYSICAL  Bradley Hull is a 66 y.o. male patient with CC: painful teeth. Referred by general dentist for full mouth extractions.  No diagnosis found.  Past Medical History:  Diagnosis Date  . Arthritis   . Cirrhosis ([place])   . Diabetes mellitus without complication ([place])   . Hepatitis C   . Hypertension     No current facility-administered medications for this encounter.    Current Outpatient Prescriptions  Medication Sig Dispense Refill  . amLODipine (NORVASC) 5 MG tablet Take 5 mg by mouth every evening.    . carvedilol (COREG) 6.25 MG tablet Take 6.25 mg by mouth 2 (two) times daily with a meal.    . hydrochlorothiazide (HYDRODIURIL) 25 MG tablet Take 25 mg by mouth every evening.    . hydrocortisone 1 % lotion Apply 1 application topically daily as needed for itching.    . insulin lispro (HUMALOG) 100 UNIT/ML injection Inject 5-10 Units into the skin daily as needed for high blood sugar.    . sitaGLIPtin-metformin (JANUMET) 50-500 MG tablet Take 1 tablet by mouth every evening.     . tamsulosin (FLOMAX) 0.4 MG CAPS capsule Take 0.4 mg by mouth daily after supper.     Allergies  Allergen Reactions  . Dilaudid [Hydromorphone Hcl] Itching  . Hydrocodone Itching   Active Problems:   * No active hospital problems. *  Vitals: There were no vitals taken for this visit. Lab results:No results found for this or any previous visit (from the past 53 hour(s)). Radiology Results: No results found. General appearance: alert, cooperative and moderately obese Head: Normocephalic, without obvious abnormality, atraumatic Eyes: negative Nose: Nares normal. Septum midline. Mucosa normal. No drainage or sinus tenderness. Throat: multiple decayed and periodontally involved teeth. bilateral mandibular tori. Pharynx clear, no trismus Neck: no adenopathy, supple, symmetrical, trachea midline and thyroid not enlarged, symmetric, no tenderness/mass/nodules Resp: clear to auscultation  bilaterally Cardio: regular rate and rhythm, S1, S2 normal, no murmur, click, rub or gallop  Radiograph: Panorex reveals 4 cm x 2 cm unilocular radiolucency right mandibular ramus/body associated with impacted tooth # 32. Chronic generalized periodontitis. Impacted tooth #1.  Assessment:Multiple nonrestorable teeth secondary to dental caries and periodontitis. Bilateral mandibular lingual tori. Probable odontogenic cyst right mandible associated with impacted tooth # 32.  Plan: Full mouth extractions with alveoloplasty. Removal bilateral mandibular lingual tori. Removal cyst right mandible with possible bone graft. GA. Day surgery.   Gae Bon 09/07/2016

## 2016-09-07 NOTE — Progress Notes (Signed)
   09/07/16 1019  OBSTRUCTIVE SLEEP APNEA  Have you ever been diagnosed with sleep apnea through a sleep study? No  Do you snore loudly (loud enough to be heard through closed doors)?  0  Do you often feel tired, fatigued, or sleepy during the daytime (such as falling asleep during driving or talking to someone)? 0  Has anyone observed you stop breathing during your sleep? 0  Do you have, or are you being treated for high blood pressure? 1  BMI more than 35 kg/m2? 1  Age > 50 (1-yes) 1  Neck circumference greater than:Male 16 inches or larger, Male 17inches or larger? 1  Male Gender (Yes=1) 1  Obstructive Sleep Apnea Score 5  Score 5 or greater  Results sent to PCP

## 2016-09-07 NOTE — Pre-Procedure Instructions (Signed)
Bradley Hull  09/07/2016      [facility] [place], [place] - [address] [place] [address] Phone: [phone] Fax: [facility] [address] [place] [address] Phone: [phone] Fax: [phone]    Your procedure is scheduled on Feb 16  Report to [place] at 645 A.M.  Call this number if you have problems the morning of surgery:  [phone]   Remember:  Do not eat food or drink liquids after midnight.  Take these medicines the morning of surgery with A SIP OF WATER Amlodipine (Norvasc), Carvedilol (Coreg)    How to Manage Your Diabetes Before and After Surgery  Why is it important to control my blood sugar before and after surgery? . Improving blood sugar levels before and after surgery helps healing and can limit problems. . A way of improving blood sugar control is eating a healthy diet by: o  Eating less sugar and carbohydrates o  Increasing activity/exercise o  Talking with your doctor about reaching your blood sugar goals . High blood sugars (greater than 180 mg/dL) can raise your risk of infections and slow your recovery, so you will need to focus on controlling your diabetes during the weeks before surgery. . Make sure that the doctor who takes care of your diabetes knows about your planned surgery including the date and location.  How do I manage my blood sugar before surgery? . Check your blood sugar at least 4 times a day, starting 2 days before surgery, to make sure that the level is not too high or low. o Check your blood sugar the morning of your surgery when you wake up and every 2 hours until you get to the Short Stay unit. . If your blood sugar is less than 70 mg/dL, you will need to treat for low blood sugar: o Do not take insulin. o Treat a low blood sugar (less than 70 mg/dL) with  cup of clear juice  (cranberry or apple), 4 glucose tablets, OR glucose gel. o Recheck blood sugar in 15 minutes after treatment (to make sure it is greater than 70 mg/dL). If your blood sugar is not greater than 70 mg/dL on recheck, call [phone] for further instructions. . Report your blood sugar to the short stay nurse when you get to Short Stay.  . If you are admitted to the hospital after surgery: o Your blood sugar will be checked by the staff and you will probably be given insulin after surgery (instead of oral diabetes medicines) to make sure you have good blood sugar levels. o The goal for blood sugar control after surgery is 80-180 mg/dL.       WHAT DO I DO ABOUT MY DIABETES MEDICATION?   Marland Kitchen Do not take oral diabetes medicines (pills) the morning of surgery. Sitagliptin (Janumet)   . The day of surgery, do not take other diabetes injectables, including Byetta (exenatide), Bydureon (exenatide ER), Victoza (liraglutide), or Trulicity (dulaglutide).  . If your CBG is greater than 220 mg/dL, you may take  of your sliding scale (correction) dose of insulin.  Other Instructions:          Patient Signature:  Date:   Nurse Signature:  Date:   Reviewed and Endorsed by [facility] Patient Education Committee, August 2015  Do not wear jewelry, make-up or nail polish.  Do not wear lotions, powders, or  perfumes, or deoderant.  Do not shave 48 hours prior to surgery.  Men may shave face and neck.  Do not bring valuables to the hospital.  [facility] is not responsible for any belongings or valuables.  Contacts, dentures or bridgework may not be worn into surgery.  Leave your suitcase in the car.  After surgery it may be brought to your room.  For patients admitted to the hospital, discharge time will be determined by your treatment team.  Patients discharged the day of surgery will not be allowed to drive home.  Special instructions:  [facility] - Preparing for Surgery  Before surgery,  you can play an important role.  Because skin is not sterile, your skin needs to be as free of germs as possible.  You can reduce the number of germs on you skin by washing with CHG (chlorahexidine gluconate) soap before surgery.  CHG is an antiseptic cleaner which kills germs and bonds with the skin to continue killing germs even after washing.  Please DO NOT use if you have an allergy to CHG or antibacterial soaps.  If your skin becomes reddened/irritated stop using the CHG and inform your nurse when you arrive at Short Stay.  Do not shave (including legs and underarms) for at least 48 hours prior to the first CHG shower.  You may shave your face.  Please follow these instructions carefully:   1.  Shower with CHG Soap the night before surgery and the    morning of Surgery.  2.  If you choose to wash your hair, wash your hair first as usual with your   normal shampoo.  3.  After you shampoo, rinse your hair and body thoroughly to remove the  Shampoo.  4.  Use CHG as you would any other liquid soap.  You can apply chg directly to the skin and wash gently with scrungie or a clean washcloth.  5.  Apply the CHG Soap to your body ONLY FROM THE NECK DOWN.    Do not use on open wounds or open sores.  Avoid contact with your eyes,       ears, mouth and genitals (private parts).  Wash genitals (private parts)  with your normal soap.  6.  Wash thoroughly, paying special attention to the area where your surgery  will be performed.  7.  Thoroughly rinse your body with warm water from the neck down.  8.  DO NOT shower/wash with your normal soap after using and rinsing off  the CHG Soap.  9.  Pat yourself dry with a clean towel.            10.  Wear clean pajamas.            11.  Place clean sheets on your bed the night of your first shower and do not  sleep with pets.  Day of Surgery  Do not apply any lotions/deoderants the morning of surgery.  Please wear clean clothes to the hospital/surgery  center.     Please read over the following fact sheets that you were given. Pain Booklet, Coughing and Deep Breathing and Surgical Site Infection Prevention

## 2016-09-08 LAB — HEMOGLOBIN A1C
Hgb A1c MFr Bld: 10.8 % — ABNORMAL HIGH (ref 4.8–5.6)
MEAN PLASMA GLUCOSE: 263 mg/dL

## 2016-09-08 NOTE — Progress Notes (Addendum)
Anesthesia chart review: Patient is a 66 year old male scheduled for multiple teeth extraction, removal bilateral mandibular tori, removal cyst right mandible on 09/09/2016 by Dr. Hoyt Koch.  History includes smoking, hypertension, hepatitis C (s/p Sofosbuvir and RVN, completed treatment 07/04/14 with post-treatment HCV RNA level non-detectable indicating evidence of cure) with cirrhosis, nephrolithiasis, arthritis, diabetes mellitus type 2. BMI is consistent with obesity.  PC[facility] is with [facility]. GI is with [facility], last visit 11/06/15 with Ebony Cargo, DNP/Dr. Rayvon Char.   Meds include amlodipine, Coreg, HCTZ, Humalog, Janumet, Flomax.  BP (!) 152/76   Pulse 77   Temp 36.8 C   Resp 20   Ht 5\' 6"  (1.676 m)   Wt 236 lb 12.8 oz (107.4 kg)   SpO2 95%   BMI 38.22 kg/m   EKG 09/07/16: NSR, LAD.  He denied having prior cardiac testing such as cath, stress test, or echo.  MRI Liver 12/26/15 [facility]; Care Everywhere): IMPRESSION:  -- Cirrhosis with small upper abdominal varices. No washout lesions suggestive of HCC.  -- LR-3 nodule in segment VIII.Recommend follow-up MR of the abdomen in 3-6 months.   EGD 03/26/2015 [facility]; Care Everywhere): Grade I varices were found in the lower third of the esophagus. Moderate portal hypertensive gastropathy was found in the entire examined stomach. The examined duodenum was normal.  Colonoscopy 12/02/13 [facility]; Care Everywhere): Colonic lesion involving ileocecal valve. Five colonic polyps retrieved. + internal hemorrhoids. Biopsy results: Fragments of colonic mucosa with a mild increase in lamina propria lymphocytes and plasma cells and focal crypt branching. No granulomas, viral cytopathic effect, serrated polyp, or dysplasia identified.  Preoperative labs noted. Cr 0.76. Glucose 311 (non-fasting). A1c 10.8, consistent with mean plasma glucose of 263. He reports his fasting CBGs run  around 165. CBC WNL. He has reported cirrhosis, so he will need LFTs and PT/INR done on the day of surgery since these were not included in this PAT labs. (Historically, his LFTS have been normal to only mildly elevated.) Poorly controlled DM/elevated A1c called to Sam at Dr. Lupita Leash office. Patient will also get a fasting CBG. If glucose and LFT results acceptable then I would anticipate that he could proceed as planned. However, if glucose significantly elevated, procedure could get delayed (for treatment) or cancelled.   George Hugh [facility] Short Stay Center/Anesthesiology Phone [phone] 09/08/2016 10:39 AM

## 2016-09-09 ENCOUNTER — Ambulatory Visit (HOSPITAL_COMMUNITY): Payer: Medicaid Other | Admitting: Vascular Surgery

## 2016-09-09 ENCOUNTER — Encounter (HOSPITAL_COMMUNITY)
Admission: RE | Disposition: A | Discharge status: Home / Self Care | Payer: Self-pay | Source: Ambulatory Visit | Attending: Oral Surgery

## 2016-09-09 ENCOUNTER — Ambulatory Visit (HOSPITAL_COMMUNITY)
Admission: RE | Admit: 2016-09-09 | Discharge: 2016-09-09 | Disposition: A | Discharge status: Home / Self Care | Payer: Medicaid Other | Source: Ambulatory Visit | Attending: Oral Surgery | Admitting: Oral Surgery

## 2016-09-09 DIAGNOSIS — E119 Type 2 diabetes mellitus without complications: Secondary | ICD-10-CM | POA: Insufficient documentation

## 2016-09-09 DIAGNOSIS — Z79899 Other long term (current) drug therapy: Secondary | ICD-10-CM | POA: Diagnosis not present

## 2016-09-09 DIAGNOSIS — I10 Essential (primary) hypertension: Secondary | ICD-10-CM | POA: Insufficient documentation

## 2016-09-09 DIAGNOSIS — K0530 Chronic periodontitis, unspecified: Secondary | ICD-10-CM | POA: Insufficient documentation

## 2016-09-09 DIAGNOSIS — Z794 Long term (current) use of insulin: Secondary | ICD-10-CM | POA: Diagnosis not present

## 2016-09-09 DIAGNOSIS — M270 Developmental disorders of jaws: Secondary | ICD-10-CM | POA: Insufficient documentation

## 2016-09-09 DIAGNOSIS — K011 Impacted teeth: Secondary | ICD-10-CM | POA: Insufficient documentation

## 2016-09-09 DIAGNOSIS — M2740 Unspecified cyst of jaw: Secondary | ICD-10-CM | POA: Diagnosis not present

## 2016-09-09 DIAGNOSIS — F17200 Nicotine dependence, unspecified, uncomplicated: Secondary | ICD-10-CM | POA: Diagnosis not present

## 2016-09-09 DIAGNOSIS — K029 Dental caries, unspecified: Secondary | ICD-10-CM | POA: Diagnosis not present

## 2016-09-09 HISTORY — PX: MULTIPLE EXTRACTIONS WITH ALVEOLOPLASTY: SHX5342

## 2016-09-09 LAB — GLUCOSE, CAPILLARY
GLUCOSE-CAPILLARY: 121 mg/dL — AB (ref 65–99)
Glucose-Capillary: 153 mg/dL — ABNORMAL HIGH (ref 65–99)

## 2016-09-09 LAB — PROTIME-INR
INR: 1.11
PROTHROMBIN TIME: 14.3 s (ref 11.4–15.2)

## 2016-09-09 LAB — HEPATIC FUNCTION PANEL
ALBUMIN: 3.6 g/dL (ref 3.5–5.0)
ALT: 24 U/L (ref 17–63)
AST: 29 U/L (ref 15–41)
Alkaline Phosphatase: 69 U/L (ref 38–126)
BILIRUBIN TOTAL: 1.1 mg/dL (ref 0.3–1.2)
Bilirubin, Direct: 0.2 mg/dL (ref 0.1–0.5)
Indirect Bilirubin: 0.9 mg/dL (ref 0.3–0.9)
TOTAL PROTEIN: 7.1 g/dL (ref 6.5–8.1)

## 2016-09-09 SURGERY — MULTIPLE EXTRACTION WITH ALVEOLOPLASTY
Anesthesia: General

## 2016-09-09 MED ORDER — CLINDAMYCIN PHOSPHATE 600 MG/50ML IV SOLN
600.0000 mg | Freq: Four times a day (QID) | INTRAVENOUS | Status: AC
  Administered 2016-09-09: 600 mg via INTRAVENOUS
  Filled 2016-09-09: qty 50

## 2016-09-09 MED ORDER — FENTANYL CITRATE (PF) 100 MCG/2ML IJ SOLN
INTRAMUSCULAR | Status: DC | PRN
  Administered 2016-09-09 (×2): 50 ug via INTRAVENOUS
  Administered 2016-09-09: 100 ug via INTRAVENOUS

## 2016-09-09 MED ORDER — PROPOFOL 10 MG/ML IV BOLUS
INTRAVENOUS | Status: AC
  Filled 2016-09-09: qty 20

## 2016-09-09 MED ORDER — MIDAZOLAM HCL 2 MG/2ML IJ SOLN
INTRAMUSCULAR | Status: DC | PRN
  Administered 2016-09-09: 2 mg via INTRAVENOUS

## 2016-09-09 MED ORDER — CLINDAMYCIN HCL 300 MG PO CAPS: 300.0000 mg | ORAL_CAPSULE | Freq: Three times a day (TID) | ORAL | 0 refills | Status: DC

## 2016-09-09 MED ORDER — 0.9 % SODIUM CHLORIDE (POUR BTL) OPTIME
TOPICAL | Status: DC | PRN
  Administered 2016-09-09: 1000 mL

## 2016-09-09 MED ORDER — OXYMETAZOLINE HCL 0.05 % NA SOLN
NASAL | Status: AC
  Filled 2016-09-09: qty 15

## 2016-09-09 MED ORDER — FENTANYL CITRATE (PF) 100 MCG/2ML IJ SOLN
INTRAMUSCULAR | Status: AC
  Filled 2016-09-09: qty 2

## 2016-09-09 MED ORDER — LABETALOL HCL 5 MG/ML IV SOLN
INTRAVENOUS | Status: AC
  Filled 2016-09-09: qty 4

## 2016-09-09 MED ORDER — OXYMETAZOLINE HCL 0.05 % NA SOLN
NASAL | Status: DC | PRN
  Administered 2016-09-09: 1

## 2016-09-09 MED ORDER — OXYCODONE HCL 5 MG/5ML PO SOLN
ORAL | Status: DC
Start: 2016-09-09 — End: 2016-09-09
  Filled 2016-09-09: qty 5

## 2016-09-09 MED ORDER — CHLORHEXIDINE GLUCONATE CLOTH 2 % EX PADS: 6.0000 | MEDICATED_PAD | Freq: Once | CUTANEOUS | Status: DC

## 2016-09-09 MED ORDER — LABETALOL HCL 5 MG/ML IV SOLN
10.0000 mg | INTRAVENOUS | Status: DC | PRN
  Administered 2016-09-09: 10 mg via INTRAVENOUS

## 2016-09-09 MED ORDER — LIDOCAINE 2% (20 MG/ML) 5 ML SYRINGE
INTRAMUSCULAR | Status: AC
  Filled 2016-09-09: qty 5

## 2016-09-09 MED ORDER — SODIUM CHLORIDE 0.9 % IR SOLN
Status: DC | PRN
  Administered 2016-09-09: 1000 mL

## 2016-09-09 MED ORDER — ONDANSETRON HCL 4 MG/2ML IJ SOLN: 4.0000 mg | Freq: Four times a day (QID) | INTRAMUSCULAR | Status: DC | PRN

## 2016-09-09 MED ORDER — ONDANSETRON HCL 4 MG/2ML IJ SOLN
INTRAMUSCULAR | Status: AC
  Filled 2016-09-09: qty 2

## 2016-09-09 MED ORDER — EPHEDRINE 5 MG/ML INJ
INTRAVENOUS | Status: AC
  Filled 2016-09-09: qty 10

## 2016-09-09 MED ORDER — PHENYLEPHRINE 40 MCG/ML (10ML) SYRINGE FOR IV PUSH (FOR BLOOD PRESSURE SUPPORT)
PREFILLED_SYRINGE | INTRAVENOUS | Status: AC
  Filled 2016-09-09: qty 10

## 2016-09-09 MED ORDER — GLYCOPYRROLATE 0.2 MG/ML IJ SOLN
INTRAMUSCULAR | Status: DC | PRN
  Administered 2016-09-09: 0.2 mg via INTRAVENOUS

## 2016-09-09 MED ORDER — LIDOCAINE-EPINEPHRINE (PF) 2 %-1:200000 IJ SOLN
INTRAMUSCULAR | Status: DC | PRN
  Administered 2016-09-09: 20 mL via INTRADERMAL

## 2016-09-09 MED ORDER — PROPOFOL 10 MG/ML IV BOLUS
INTRAVENOUS | Status: DC | PRN
  Administered 2016-09-09: 200 mg via INTRAVENOUS

## 2016-09-09 MED ORDER — EPHEDRINE SULFATE 50 MG/ML IJ SOLN
INTRAMUSCULAR | Status: DC | PRN
  Administered 2016-09-09 (×4): 5 mg via INTRAVENOUS

## 2016-09-09 MED ORDER — OXYMETAZOLINE HCL 0.05 % NA SOLN
NASAL | Status: DC | PRN
  Administered 2016-09-09: 2 via NASAL

## 2016-09-09 MED ORDER — ONDANSETRON HCL 4 MG/2ML IJ SOLN
INTRAMUSCULAR | Status: DC | PRN
  Administered 2016-09-09: 4 mg via INTRAVENOUS

## 2016-09-09 MED ORDER — OXYCODONE HCL 5 MG/5ML PO SOLN
5.0000 mg | Freq: Once | ORAL | Status: AC | PRN
  Administered 2016-09-09: 5 mg via ORAL

## 2016-09-09 MED ORDER — DIPHENHYDRAMINE HCL 25 MG PO CAPS: ORAL_CAPSULE | ORAL | 0 refills | Status: DC

## 2016-09-09 MED ORDER — LACTATED RINGERS IV SOLN
INTRAVENOUS | Status: DC
  Administered 2016-09-09: 11:00:00 via INTRAVENOUS
  Administered 2016-09-09: 10 mL/h via INTRAVENOUS

## 2016-09-09 MED ORDER — SUCCINYLCHOLINE CHLORIDE 20 MG/ML IJ SOLN
INTRAMUSCULAR | Status: DC | PRN
  Administered 2016-09-09: 100 mg via INTRAVENOUS

## 2016-09-09 MED ORDER — FENTANYL CITRATE (PF) 100 MCG/2ML IJ SOLN
25.0000 ug | INTRAMUSCULAR | Status: DC | PRN
  Administered 2016-09-09 (×4): 25 ug via INTRAVENOUS

## 2016-09-09 MED ORDER — OXYCODONE HCL 5 MG PO TABS: 5.0000 mg | ORAL_TABLET | Freq: Once | ORAL | Status: AC | PRN

## 2016-09-09 MED ORDER — PHENYLEPHRINE HCL 10 MG/ML IJ SOLN
INTRAMUSCULAR | Status: DC | PRN
  Administered 2016-09-09: 80 ug via INTRAVENOUS
  Administered 2016-09-09 (×2): 120 ug via INTRAVENOUS
  Administered 2016-09-09: 80 ug via INTRAVENOUS

## 2016-09-09 MED ORDER — OXYCODONE-ACETAMINOPHEN 10-325 MG PO TABS: 1.0000 | ORAL_TABLET | ORAL | 0 refills | Status: DC | PRN

## 2016-09-09 MED ORDER — MIDAZOLAM HCL 2 MG/2ML IJ SOLN
INTRAMUSCULAR | Status: AC
  Filled 2016-09-09: qty 2

## 2016-09-09 MED ORDER — LIDOCAINE HCL (CARDIAC) 20 MG/ML IV SOLN
INTRAVENOUS | Status: DC | PRN
  Administered 2016-09-09: 60 mg via INTRATRACHEAL

## 2016-09-09 SURGICAL SUPPLY — 47 items
BLADE 10 SAFETY STRL DISP (BLADE) ×2 IMPLANT
BLADE SURG 15 STRL LF DISP TIS (BLADE) ×1 IMPLANT
BLADE SURG 15 STRL SS (BLADE) ×2
BONE CANC CHIPS 20CC PCAN1/4 (Bone Implant) ×2 IMPLANT
BUR CROSS CUT (BURR)
BUR CROSS CUT FISSURE 1.6 (BURR) ×2 IMPLANT
BUR EGG ELITE 4.0 (BURR) ×2 IMPLANT
BUR SRG MED 1.6XXCUT FSSR (BURR) IMPLANT
BUR SRG MED 2.1XXCUT FSSR (BURR) IMPLANT
BURR SRG MED 1.6XXCUT FSSR (BURR)
BURR SRG MED 2.1XXCUT FSSR (BURR)
CANISTER SUCTION 2500CC (MISCELLANEOUS) ×2 IMPLANT
CHIPS CANC BONE 20CC PCAN1/4 (Bone Implant) ×1 IMPLANT
COVER SURGICAL LIGHT HANDLE (MISCELLANEOUS) ×2 IMPLANT
CRADLE DONUT ADULT HEAD (MISCELLANEOUS) ×2 IMPLANT
DECANTER SPIKE VIAL GLASS SM (MISCELLANEOUS) ×1 IMPLANT
DRAPE U-SHAPE 76X120 STRL (DRAPES) ×2 IMPLANT
FLUID NSS /IRRIG 1000 ML XXX (MISCELLANEOUS) ×2 IMPLANT
GAUZE PACKING FOLDED 2  STR (GAUZE/BANDAGES/DRESSINGS) ×1
GAUZE PACKING FOLDED 2 STR (GAUZE/BANDAGES/DRESSINGS) ×1 IMPLANT
GAUZE SPONGE 4X4 16PLY XRAY LF (GAUZE/BANDAGES/DRESSINGS) ×2 IMPLANT
GLOVE BIO SURGEON STRL SZ 6.5 (GLOVE) ×3 IMPLANT
GLOVE BIO SURGEON STRL SZ7.5 (GLOVE) ×3 IMPLANT
GLOVE BIOGEL PI IND STRL 7.0 (GLOVE) IMPLANT
GLOVE BIOGEL PI INDICATOR 7.0 (GLOVE) ×1
GOWN STRL REUS W/ TWL LRG LVL3 (GOWN DISPOSABLE) ×1 IMPLANT
GOWN STRL REUS W/ TWL XL LVL3 (GOWN DISPOSABLE) ×1 IMPLANT
GOWN STRL REUS W/TWL LRG LVL3 (GOWN DISPOSABLE) ×2
GOWN STRL REUS W/TWL XL LVL3 (GOWN DISPOSABLE) ×2
GRAFT BNE CANC CHIPS 1-8 20CC (Bone Implant) IMPLANT
KIT BASIN OR (CUSTOM PROCEDURE TRAY) ×2 IMPLANT
KIT ROOM TURNOVER OR (KITS) ×2 IMPLANT
NDL BLUNT 16X1.5 OR ONLY (NEEDLE) IMPLANT
NEEDLE 22X1 1/2 (OR ONLY) (NEEDLE) ×4 IMPLANT
NEEDLE BLUNT 16X1.5 OR ONLY (NEEDLE) IMPLANT
NS IRRIG 1000ML POUR BTL (IV SOLUTION) ×2 IMPLANT
PAD ARMBOARD 7.5X6 YLW CONV (MISCELLANEOUS) ×4 IMPLANT
SUT CHROMIC 3 0 PS 2 (SUTURE) ×4 IMPLANT
SYR 50ML SLIP (SYRINGE) IMPLANT
SYR CONTROL 10ML LL (SYRINGE) ×2 IMPLANT
TOWEL OR 17X24 6PK STRL BLUE (TOWEL DISPOSABLE) ×2 IMPLANT
TOWEL OR 17X26 10 PK STRL BLUE (TOWEL DISPOSABLE) ×2 IMPLANT
TRAY ENT MC OR (CUSTOM PROCEDURE TRAY) ×2 IMPLANT
TUBE CONNECTING 12X1/4 (SUCTIONS) ×2 IMPLANT
TUBING IRRIGATION (MISCELLANEOUS) ×2 IMPLANT
WATER STERILE IRR 1000ML POUR (IV SOLUTION) ×2 IMPLANT
YANKAUER SUCT BULB TIP NO VENT (SUCTIONS) ×2 IMPLANT

## 2016-09-09 NOTE — H&P (Signed)
H&P documentation  -History and Physical Reviewed  -Patient has been re-examined  -No change in the plan of care  Saphyre Cillo M  

## 2016-09-09 NOTE — Op Note (Signed)
09/09/2016  11:21 AM  PATIENT:  Domingo Madeira  66 y.o. male  PRE-OPERATIVE DIAGNOSIS:  NON RESTORABLE TEETH# 2, 6, 7, 9, 10, 11, 16, 17, 20, 21, 22, 24, 25, 26, 27, 28, 29, 31, IMPACTED TEETH 1, 32. BILATERAL MANDIBULAR LINGUAL TORI, CYST RIGHT MANDIBLE  POST-OPERATIVE DIAGNOSIS:  SAME  PROCEDURE:  Procedure(s): MULTIPLE EXTRACTION WITH ALVEOLOPLASTY - one, two, six, seven, nine, ten, eleven, 49, seventeen, twenty, twenty-one, twenty-two, twenty-four, twenty-five, twenty-six, twenty-seven, twenty-eight, twenty-nine, thirty-one, thirty-two; alveolplasty; removal bilateral mandible lingual  tori; removal cyst right mandible;  bone graft right mandible   SURGEON:  Surgeon(s): [name], DDS  ANESTHESIA:   local and general  EBL:  minimal  DRAINS: none   SPECIMEN:  No Specimen  COUNTS:  YES  PLAN OF CARE: Discharge to home after PACU  PATIENT DISPOSITION:  PACU - hemodynamically stable.   PROCEDURE DETAILS: Dictation [id number]  Gae Bon, DMD 09/09/2016 11:21 AM

## 2016-09-09 NOTE — Transfer of Care (Signed)
Immediate Anesthesia Transfer of Care Note  Patient: Bradley Hull  Procedure(s) Performed: Procedure(s): MULTIPLE EXTRACTION WITH ALVEOLOPLASTY - one, two, six, seven, nine, ten, eleven, 33, 13, twenty, twenty-one, twenty-two, twenty-four, twenty-five, twenty-six, twenty-seven, twenty-eight, twenty-nine, thirty-one, thirty-two; alveolplasty; removal bilateral mandible turi; removal cyst right mandible; possible bone graft (N/A) CYST REMOVAL (N/A)  Patient Location: PACU  Anesthesia Type:General  Level of Consciousness: awake, alert  and patient cooperative  Airway & Oxygen Therapy: Patient Spontanous Breathing and Patient connected to nasal cannula oxygen  Post-op Assessment: Report given to RN, Post -op Vital signs reviewed and stable, Patient moving all extremities X 4 and Patient able to stick tongue midline  Post vital signs: Reviewed and stable  Last Vitals:  Vitals:   09/09/16 0810 09/09/16 1127  BP: 138/74 (!) 159/102  Pulse: 71 87  Resp: 20 13  Temp: 36.8 C 36.4 C    Last Pain:  Vitals:   09/09/16 0810  TempSrc: Oral         Complications: No apparent anesthesia complications

## 2016-09-09 NOTE — Anesthesia Postprocedure Evaluation (Signed)
Anesthesia Post Note  Patient: [name]  Procedure(s) Performed: Procedure(s) (LRB): MULTIPLE EXTRACTION WITH ALVEOLOPLASTY - one, two, six, seven, nine, ten, eleven, sixteen, seventeen, twenty, twenty-one, twenty-two, twenty-four, twenty-five, twenty-six, twenty-seven, twenty-eight, twenty-nine, thirty-one, thirty-two; alveolplasty; removal bilateral mandible turi; removal cyst right mandible;  bone graft (N/A)  Patient location during evaluation: PACU Anesthesia Type: General Level of consciousness: awake and alert and patient cooperative Pain management: pain level controlled Vital Signs Assessment: post-procedure vital signs reviewed and stable Respiratory status: spontaneous breathing and respiratory function stable Cardiovascular status: stable Anesthetic complications: no        Last Vitals:  Vitals:   09/09/16 1300 09/09/16 1315  BP: (!) 160/94 (!) 155/88  Pulse: 79 78  Resp: 14 15  Temp: 36.6 C     Last Pain:  Vitals:   09/09/16 1300  TempSrc:   PainSc: 4    Pain Goal:                 Bradley Hull S

## 2016-09-09 NOTE — Anesthesia Preprocedure Evaluation (Signed)
Anesthesia Evaluation  Patient identified by MRN, date of birth, ID band Patient awake    Reviewed: Allergy & Precautions, H&P , NPO status , Patient's Chart, lab work & pertinent test results  Airway Mallampati: II   Neck ROM: full    Dental   Pulmonary Current Smoker,    breath sounds clear to auscultation       Cardiovascular hypertension,  Rhythm:regular Rate:Normal     Neuro/Psych    GI/Hepatic (+) Cirrhosis       , Hepatitis -, C  Endo/Other  diabetes, Type 2  Renal/GU      Musculoskeletal  (+) Arthritis ,   Abdominal   Peds  Hematology   Anesthesia Other Findings   Reproductive/Obstetrics                             Anesthesia Physical Anesthesia Plan  ASA: III  Anesthesia Plan: General   Post-op Pain Management:    Induction: Intravenous  Airway Management Planned: Nasal ETT  Additional Equipment:   Intra-op Plan:   Post-operative Plan: Extubation in OR  Informed Consent: I have reviewed the patients History and Physical, chart, labs and discussed the procedure including the risks, benefits and alternatives for the proposed anesthesia with the patient or authorized representative who has indicated his/her understanding and acceptance.     Plan Discussed with: CRNA, Anesthesiologist and Surgeon  Anesthesia Plan Comments:         Anesthesia Quick Evaluation

## 2016-09-09 NOTE — Anesthesia Procedure Notes (Signed)
Procedure Name: Intubation Date/Time: 09/09/2016 9:54 AM Performed by: Marcie Bal, ADAM Pre-anesthesia Checklist: Patient identified, Emergency Drugs available, Suction available and Patient being monitored Patient Re-evaluated:Patient Re-evaluated prior to inductionOxygen Delivery Method: Circle system utilized Preoxygenation: Pre-oxygenation with 100% oxygen Intubation Type: IV induction Laryngoscope Size: Mac and 3 Grade View: Grade I Nasal Tubes: Right and Magill forceps- large, utilized Tube size: 7.0 mm Number of attempts: 1 Placement Confirmation: ETT inserted through vocal cords under direct vision,  positive ETCO2 and breath sounds checked- equal and bilateral Tube secured with: Tape Dental Injury: Teeth and Oropharynx as per pre-operative assessment and Bloody posterior oropharynx

## 2016-09-12 ENCOUNTER — Encounter (HOSPITAL_COMMUNITY): Payer: Self-pay | Admitting: Oral Surgery

## 2016-09-12 NOTE — Op Note (Signed)
NAME:  Bradley Hull, Bradley Hull NO.:  [id number]  MEDICAL RECORD NO.:  [id number]  LOCATION:                                 FACILITY:  PHYSICIAN:  Gae Bon, M.D.  DATE OF BIRTH:  Oct 24, 1950  DATE OF PROCEDURE:  09/09/2016 DATE OF DISCHARGE:                              OPERATIVE REPORT   PREOPERATIVE DIAGNOSES:  Nonrestorable teeth secondary to dental caries and periodontal disease numbers 2, 6, 7, 9, 10, 11, 16, 17, 20, 21, 22, 24, 25, 26, 27, 28, 29, 31; bony impacted teeth numbers 1 and 32; bilateral mandibular lingual tori; cyst, right mandible.  POSTOPERATIVE DIAGNOSES:  Nonrestorable teeth secondary to dental caries and periodontal disease numbers 2, 6, 7, 9, 10, 11, 16, 17, 20, 21, 22, 24, 25, 26, 27, 28, 29, 31; bony impacted teeth numbers 1 and 32; bilateral mandibular lingual tori; cyst, right mandible.  PROCEDURE:  Extraction teeth numbers 1, 2, 6, 7, 9, 10, 11, 16, 17, 20, 21, 22, 24, 25, 26, 27, 28, 29, 31, 32; alveoplasty right and left maxilla and mandible; removal of bilateral mandibular lingual tori; removal of cyst, right mandible; bone graft, right mandible.  SURGEON:  Gae Bon, M.D.  ANESTHESIA:  General.  DESCRIPTION OF PROCEDURE:  The patient was taken to the operating room, placed on the table in supine position.  General anesthesia was administered intravenously and a nasal endotracheal tube was placed and secured.  The eyes were protected.  The patient was draped for the procedure.  Time-out was performed.  The posterior pharynx was suctioned.  A throat pack was placed.  Lidocaine 2% with 1:100,000 epinephrine was infiltrated in an inferior alveolar block on the right and left side and buccal and palatal infiltration in the maxilla. Additional anesthetic solution was administered in the anterior mandible on the buccal aspect.  A total of 20 mL was utilized.  A bite block was placed on the left side of the mouth and a  sweetheart retractor was used to retract the tongue.  A #15 blade was used to make an incision around tooth #17 carried along the alveolar crest and then diverted buccally and lingually in the gingival sulcus around teeth numbers 20, 21, 22, 24, 25 and 26.  The periosteum was reflected and the teeth were elevated with a 301 elevator.  Tooth #17 was partially removed with a dental forceps.  Then, the Stryker handpiece with a fissure bur under irrigation was used to remove additional bone to remove the second root of this tooth.  Bone was removed around teeth numbers 20, 21, and 22 and then these teeth were removed with the Asch forceps.  Tooth #24 was removed and a root tip remained and this was removed using the Stryker handpiece to remove additional bone and then a root tip pick was used to remove the root fragment.  Then, tooth #25 and 26 were removed using the Asch forceps.  The sockets were curetted.  The periosteum was reflected to expose the alveolar crest and the lingual torus, then using a Stryker handpiece with the egg-shaped bur, the alveoplasty was performed and the lingual torus was removed.  Bone fragments  and drilled bone was preserved for use later in the case.  Then, the areas further smoothed with a bone file and then closed with 3-0 chromic.  Then, a 15 blade used to make an incision around tooth #16, carried forward on the crest of the maxilla and diverted buccal and palatally around teeth numbers 11, 10, 9.  The periosteum was reflected with a periosteal elevator. Tooth #16 was elevated with a 301 elevator and attempted removal with the upper forceps; however, the roots fractured and required sectioning with a Stryker handpiece and the fissure bur.  Then, bone was removed around tooth #11 and the tooth was removed with the upper forceps. Teeth numbers 9 and 10 removed with a forceps.  The periosteum was reflected to expose the alveolar crest.  An alveoplasty was  performed in the left maxilla and then irrigated and closed with 3-0 chromic.  The bite block and sweetheart retractor were repositioned the other side of the mouth.  A 15 blade used to make an incision around tooth numbers 1, 2, 6 and 7.  The periosteum was reflected to expose these teeth.  Tooth #2 was removed and portion of the tooth fractured off and so additional bone was removed with a Stryker handpiece and fissure bur.  Then, the root of tooth #2 was removed.  Bone was removed overlying tooth #1 and the tooth was elevated and removed with the dental forceps.  Tooth #6 fractured and upon attempted removal and so additional bone was removed around this tooth and the tooth was essentially removed with the dental forceps.  Tooth #7 also required additional bone removal as this tooth had a lacerated root.  When the right maxillary teeth were removed, the periosteum was reflected and alveoplasty was performed using the egg- shaped bur and bone file.  Then, the areas irrigated and closed with 3-0 chromic.  Attention was then turned to the right mandible.  A 15 blade was used to make an incision overlying tooth #32, carried forward around teeth numbers 30, 29, 28, and 27.  The periosteum was reflected and elevated the periosteum and then the teeth were elevated with a 301 elevator.  Tooth #31 was sectioned with a Stryker handpiece and then teeth numbers 28, 29, 27 were removed with the dental forceps.  Tooth #27 fractured upon attempted removal, so additional bone was removed and the root tip was removed with a root tip pick.  Then after these teeth were removed with the exception of tooth #32, the periosteum was reflected and alveoplasty was performed.  The lingual tuberosity was also removed using the Stryker handpiece with the egg-shaped bur and then it was smoothed further with a bone file.  Bone fragments were kept for use later in the case.  Then, the incision was lengthened in  the posterior aspect of the mandible along the crest of the ridge to go up the ascending ramus approximately 1-2 cm, then the tissue was reflected with a periosteal elevator.  Bone was removed around impacted tooth #32. This tooth required sectioning to remove it.  Then, the right mandibular cyst was encountered and using periosteal elevator and various curettes, the cyst was removed.  It was submitted in formalin for pathological examination.  Then, the cystic crypt was mechanically debrided using the curettes and periosteal elevator and irrigated copiously with sterile saline and then freeze-dried bone graft mixed with autologous bone that had been collected during the case was placed into the bony crypt. Then, the area  was closed with horizontal mattress 3-0 chromic sutures. The body and anterior middle region of the mandible incision were closed with 3-0 chromic interlocking sutures.  Then, the oral cavity was irrigated and suctioned.  The throat pack was removed.  The patient was awakened, taken to the recovery room, breathing spontaneously in good condition.  ESTIMATED BLOOD LOSS:  100 mL.  SPECIMEN:  Cyst, right mandible.  PRELIMINARY DIAGNOSIS:  Dentigerous cyst versus keratinizing cystic odontogenic tumor.  The patient remained in the OR under care of anesthesia services for awakening and transportation to recovery room.     Gae Bon, M.D.   ______________________________ Gae Bon, M.D.   SMJ/MEDQ  D:  09/09/2016  T:  09/10/2016  Job:  [id number]

## 2017-07-20 ENCOUNTER — Encounter (HOSPITAL_COMMUNITY): Payer: Self-pay | Admitting: Emergency Medicine

## 2017-07-20 ENCOUNTER — Emergency Department (HOSPITAL_COMMUNITY): Payer: Medicaid Other

## 2017-07-20 ENCOUNTER — Emergency Department (HOSPITAL_COMMUNITY)
Admission: EM | Admit: 2017-07-20 | Discharge: 2017-07-20 | Disposition: A | Discharge status: Home / Self Care | Payer: Medicaid Other | Attending: Emergency Medicine | Admitting: Emergency Medicine

## 2017-07-20 DIAGNOSIS — Y929 Unspecified place or not applicable: Secondary | ICD-10-CM | POA: Diagnosis not present

## 2017-07-20 DIAGNOSIS — Y9389 Activity, other specified: Secondary | ICD-10-CM | POA: Diagnosis not present

## 2017-07-20 DIAGNOSIS — S322XXA Fracture of coccyx, initial encounter for closed fracture: Secondary | ICD-10-CM | POA: Diagnosis not present

## 2017-07-20 DIAGNOSIS — K7460 Unspecified cirrhosis of liver: Secondary | ICD-10-CM | POA: Insufficient documentation

## 2017-07-20 DIAGNOSIS — Z794 Long term (current) use of insulin: Secondary | ICD-10-CM | POA: Diagnosis not present

## 2017-07-20 DIAGNOSIS — Y999 Unspecified external cause status: Secondary | ICD-10-CM | POA: Diagnosis not present

## 2017-07-20 DIAGNOSIS — S300XXA Contusion of lower back and pelvis, initial encounter: Secondary | ICD-10-CM

## 2017-07-20 DIAGNOSIS — E119 Type 2 diabetes mellitus without complications: Secondary | ICD-10-CM | POA: Diagnosis not present

## 2017-07-20 DIAGNOSIS — F17210 Nicotine dependence, cigarettes, uncomplicated: Secondary | ICD-10-CM | POA: Diagnosis not present

## 2017-07-20 DIAGNOSIS — Z79899 Other long term (current) drug therapy: Secondary | ICD-10-CM | POA: Insufficient documentation

## 2017-07-20 DIAGNOSIS — S3992XA Unspecified injury of lower back, initial encounter: Secondary | ICD-10-CM | POA: Diagnosis present

## 2017-07-20 DIAGNOSIS — I10 Essential (primary) hypertension: Secondary | ICD-10-CM | POA: Diagnosis not present

## 2017-07-20 MED ORDER — OXYCODONE HCL 5 MG PO TABS: 5.0000 mg | ORAL_TABLET | Freq: Three times a day (TID) | ORAL | 0 refills | Status: DC | PRN

## 2017-07-20 MED ORDER — KETOROLAC TROMETHAMINE 60 MG/2ML IM SOLN
60.0000 mg | Freq: Once | INTRAMUSCULAR | Status: AC
  Administered 2017-07-20: 60 mg via INTRAMUSCULAR
  Filled 2017-07-20: qty 2
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
## 2017-07-20 NOTE — Discharge Instructions (Signed)
1. Medications: Roxicodone for severe pain, usual home medications 2. Treatment: rest, ice, use pillow for sitting, drink plenty of fluids, gentle stretching; take stool softener with pain medication; DO NOT mix pain medication and alcohol or other medications; do not drive a car or operate machinery 3. Follow Up: Please followup with your PCP in 1 week if no improvement for discussion of your diagnoses and further evaluation after today's visit; if you do not have a primary care doctor use the resource guide provided to find one; Please return to the ER for worsening symptoms or other concerns
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
## 2017-09-08 ENCOUNTER — Encounter (HOSPITAL_COMMUNITY): Payer: Self-pay | Admitting: *Deleted

## 2017-09-08 ENCOUNTER — Other Ambulatory Visit: Payer: Self-pay

## 2017-09-08 ENCOUNTER — Emergency Department (HOSPITAL_COMMUNITY)
Admission: EM | Admit: 2017-09-08 | Discharge: 2017-09-08 | Disposition: A | Discharge status: Home / Self Care | Payer: Medicaid Other | Attending: Emergency Medicine | Admitting: Emergency Medicine

## 2017-09-08 DIAGNOSIS — Z79899 Other long term (current) drug therapy: Secondary | ICD-10-CM | POA: Insufficient documentation

## 2017-09-08 DIAGNOSIS — E119 Type 2 diabetes mellitus without complications: Secondary | ICD-10-CM | POA: Diagnosis not present

## 2017-09-08 DIAGNOSIS — Z794 Long term (current) use of insulin: Secondary | ICD-10-CM | POA: Insufficient documentation

## 2017-09-08 DIAGNOSIS — M25561 Pain in right knee: Secondary | ICD-10-CM | POA: Insufficient documentation

## 2017-09-08 DIAGNOSIS — F17210 Nicotine dependence, cigarettes, uncomplicated: Secondary | ICD-10-CM | POA: Insufficient documentation

## 2017-09-08 DIAGNOSIS — I10 Essential (primary) hypertension: Secondary | ICD-10-CM | POA: Diagnosis not present

## 2017-09-08 MED ORDER — TRAMADOL HCL 50 MG PO TABS: 50.0000 mg | ORAL_TABLET | Freq: Four times a day (QID) | ORAL | 0 refills | Status: DC | PRN

## 2017-09-08 MED ORDER — MELOXICAM 7.5 MG PO TABS: 7.5000 mg | ORAL_TABLET | Freq: Every day | ORAL | 0 refills | Status: DC

## 2017-09-08 NOTE — ED Triage Notes (Signed)
Pt in c/o R knee pain onset x 2 days with swelling, hx of arthritis, denies new injury, pt c/o severe pain with ambulation, pt A&O x4

## 2017-09-08 NOTE — ED Provider Notes (Signed)
[facility] EMERGENCY DEPARTMENT Provider Note   CSN: [id number] Arrival date & time: 09/08/17  1202     History   Chief Complaint Chief Complaint  Patient presents with  . Knee Pain    HPI Bradley Hull is a 67 y.o. male.  The history is provided by the patient. No language interpreter was used.  Knee Pain   This is a chronic problem. Episode onset: years. The problem occurs constantly. The problem has been gradually worsening. The pain is present in the right knee. The pain is moderate. Associated symptoms include limited range of motion. He has tried nothing for the symptoms. The treatment provided no relief.  Pt is requesting medication for discomfort and Orthopaedic referral.   Past Medical History:  Diagnosis Date  . Arthritis   . Cirrhosis ([place])   . Diabetes mellitus without complication ([place])   . Hepatitis C   . History of kidney stones   . Hypertension     There are no active problems to display for this patient.   Past Surgical History:  Procedure Laterality Date  . COLONOSCOPY    . cyst removed     from spine  . ESOPHAGOGASTRODUODENOSCOPY    . kidney stone    . KNEE SURGERY Right   . MULTIPLE EXTRACTIONS WITH ALVEOLOPLASTY N/A 09/09/2016   Procedure: MULTIPLE EXTRACTION WITH ALVEOLOPLASTY - one, two, six, seven, nine, ten, eleven, sixteen, 70, twenty, twenty-one, twenty-two, twenty-four, twenty-five, twenty-six, twenty-seven, twenty-eight, twenty-nine, thirty-one, thirty-two; alveolplasty; removal bilateral mandible turi; removal cyst right mandible;  bone graft;  Surgeon: [name], DDS;  Location: [facility];  Service: Oral Su       Home Medications    Prior to Admission medications   Medication Sig Start Date End Date Taking? Authorizing Provider  amLODipine (NORVASC) 5 MG tablet Take 5 mg by mouth every evening.    [provider]  carvedilol (COREG) 6.25 MG tablet Take 6.25 mg by mouth 2 (two) times daily with a  meal.    [provider]  clindamycin (CLEOCIN) 300 MG capsule Take 1 capsule (300 mg total) by mouth 3 (three) times daily. 09/09/16   [name], DDS  diphenhydrAMINE (BENADRYL) 25 mg capsule Take 1-2 tabs q 6 h prn itching 09/09/16   [name], DDS  hydrochlorothiazide (HYDRODIURIL) 25 MG tablet Take 25 mg by mouth every evening.    [provider]  hydrocortisone 1 % lotion Apply 1 application topically daily as needed for itching.    [provider]  insulin lispro (HUMALOG) 100 UNIT/ML injection Inject 5-10 Units into the skin daily as needed for high blood sugar.    [provider]  meloxicam (MOBIC) 7.5 MG tablet Take 1 tablet (7.5 mg total) by mouth daily. 09/08/17   Fransico Meadow, PA-C  oxyCODONE (ROXICODONE) 5 MG immediate release tablet Take 1 tablet (5 mg total) by mouth every 8 (eight) hours as needed for severe pain. 07/20/17   Muthersbaugh, Jarrett Soho, PA-C  oxyCODONE-acetaminophen (PERCOCET) 10-325 MG tablet Take 1-2 tablets by mouth every 4 (four) hours as needed for pain. 09/09/16   [name], DDS  sitaGLIPtin-metformin (JANUMET) 50-500 MG tablet Take 1 tablet by mouth every evening.     [provider]  tamsulosin (FLOMAX) 0.4 MG CAPS capsule Take 0.4 mg by mouth daily after supper.    [provider]  traMADol (ULTRAM) 50 MG tablet Take 1 tablet (50 mg total) by mouth every 6 (six) hours as needed.  09/08/17   Fransico Meadow, PA-C    Family History No family history on file.  Social History Social History   Tobacco Use  . Smoking status: Current Every Day Smoker    Packs/day: 0.25    Types: Cigarettes  . Smokeless tobacco: Never Used  Substance Use Topics  . Alcohol use: Yes    Comment: social at times  . Drug use: No     Allergies   Dilaudid [hydromorphone hcl] and Hydrocodone   Review of Systems Review of Systems  All other systems reviewed and are negative.    Physical Exam Updated Vital  Signs BP 135/72 (BP Location: Right Arm)   Pulse 70   Temp 99.4 F (37.4 C) (Oral)   Resp 18   Ht 5\' 6"  (1.676 m)   Wt 107 kg (236 lb)   SpO2 100%   BMI 38.09 kg/m   Physical Exam  Constitutional: He appears well-developed and well-nourished.  HENT:  Head: Normocephalic.  Eyes: Pupils are equal, round, and reactive to light.  Neck: Normal range of motion.  Musculoskeletal: He exhibits tenderness.  Tender right knee, swollen, chronic arthritic appearance  pain with range of motion,  nv and ns intact   Neurological: He is alert.  Skin: Skin is warm.  Psychiatric: He has a normal mood and affect.     ED Treatments / Results  Labs (all labs ordered are listed, but only abnormal results are displayed) Labs Reviewed - No data to display  EKG  EKG Interpretation None       Radiology No results found.  Procedures Procedures (including critical care time)  Medications Ordered in ED Medications - No data to display   Initial Impression / Assessment and Plan / ED Course  I have reviewed the triage vital signs and the nursing notes.  Pertinent labs & imaging results that were available during my care of the patient were reviewed by me and considered in my medical decision making (see chart for details).   MDM  Pt counseled on arthritis.  I advised follow up Orthopaedist for evaluation  An After Visit Summary was printed and given to the patient.  Meds ordered this encounter  Medications  . traMADol (ULTRAM) 50 MG tablet    Sig: Take 1 tablet (50 mg total) by mouth every 6 (six) hours as needed.    Dispense:  15 tablet    Refill:  0    Order Specific Question:   Supervising Provider    Answer:   MILLER, BRIAN [[id number]]  . meloxicam (MOBIC) 7.5 MG tablet    Sig: Take 1 tablet (7.5 mg total) by mouth daily.    Dispense:  14 tablet    Refill:  0    Order Specific Question:   Supervising Provider    Answer:   Noemi Chapel [[id number]]     Final Clinical Impressions(s)  / ED Diagnoses   Final diagnoses:  Acute pain of right knee    ED Discharge Orders        Ordered    traMADol (ULTRAM) 50 MG tablet  Every 6 hours PRN     09/08/17 1340    meloxicam (MOBIC) 7.5 MG tablet  Daily     09/08/17 [address] [place] 09/08/17 1354    Duffy Bruce, MD 09/08/17 [phone]

## 2017-09-08 NOTE — Discharge Instructions (Signed)
Schedule to see the Orthopaedist for evaluation  

## 2017-10-09 ENCOUNTER — Other Ambulatory Visit (HOSPITAL_COMMUNITY)
Admission: RE | Admit: 2017-10-09 | Discharge: 2017-10-09 | Disposition: A | Discharge status: Home / Self Care | Payer: Medicaid Other | Source: Ambulatory Visit | Attending: Urology | Admitting: Urology

## 2017-10-09 DIAGNOSIS — E1140 Type 2 diabetes mellitus with diabetic neuropathy, unspecified: Secondary | ICD-10-CM | POA: Diagnosis present

## 2017-10-09 DIAGNOSIS — R5383 Other fatigue: Secondary | ICD-10-CM | POA: Insufficient documentation

## 2017-10-09 DIAGNOSIS — I10 Essential (primary) hypertension: Secondary | ICD-10-CM | POA: Insufficient documentation

## 2017-10-09 DIAGNOSIS — R351 Nocturia: Secondary | ICD-10-CM | POA: Diagnosis not present

## 2017-10-09 DIAGNOSIS — E782 Mixed hyperlipidemia: Secondary | ICD-10-CM | POA: Insufficient documentation

## 2017-10-09 LAB — COMPREHENSIVE METABOLIC PANEL
ALT: 15 U/L — AB (ref 17–63)
AST: 24 U/L (ref 15–41)
Albumin: 3.6 g/dL (ref 3.5–5.0)
Alkaline Phosphatase: 67 U/L (ref 38–126)
Anion gap: 10 (ref 5–15)
BILIRUBIN TOTAL: 1.4 mg/dL — AB (ref 0.3–1.2)
BUN: 9 mg/dL (ref 6–20)
CO2: 23 mmol/L (ref 22–32)
CREATININE: 0.7 mg/dL (ref 0.61–1.24)
Calcium: 9.6 mg/dL (ref 8.9–10.3)
Chloride: 106 mmol/L (ref 101–111)
GFR calc Af Amer: >60 mL/min (ref >60)
GLUCOSE: 97 mg/dL (ref 65–99)
Potassium: 4.1 mmol/L (ref 3.5–5.1)
Sodium: 139 mmol/L (ref 135–145)
TOTAL PROTEIN: 6.7 g/dL (ref 6.5–8.1)

## 2017-10-09 LAB — LIPID PANEL
CHOLESTEROL: 127 mg/dL (ref 0–200)
HDL: 61 mg/dL (ref >40)
LDL Cholesterol: 55 mg/dL (ref 0–99)
Total CHOL/HDL Ratio: 2.1 RATIO
Triglycerides: 57 mg/dL (ref <150)
VLDL: 11 mg/dL (ref 0–40)

## 2017-10-09 LAB — CBC WITH DIFFERENTIAL/PLATELET
Basophils Absolute: 0.1 10*3/uL (ref 0.0–0.1)
Basophils Relative: 1 %
EOS PCT: 9 %
Eosinophils Absolute: 0.7 10*3/uL (ref 0.0–0.7)
HEMATOCRIT: 44.2 % (ref 39.0–52.0)
Hemoglobin: 15.2 g/dL (ref 13.0–17.0)
LYMPHS ABS: 2.4 10*3/uL (ref 0.7–4.0)
LYMPHS PCT: 33 %
MCH: 31.2 pg (ref 26.0–34.0)
MCHC: 34.4 g/dL (ref 30.0–36.0)
MCV: 90.8 fL (ref 78.0–100.0)
Monocytes Absolute: 0.6 10*3/uL (ref 0.1–1.0)
Monocytes Relative: 8 %
NEUTROS PCT: 49 %
Neutro Abs: 3.6 10*3/uL (ref 1.7–7.7)
Platelets: 169 10*3/uL (ref 150–400)
RBC: 4.87 MIL/uL (ref 4.22–5.81)
RDW: 14.8 % (ref 11.5–15.5)
WBC: 7.4 10*3/uL (ref 4.0–10.5)

## 2017-10-09 LAB — HEMOGLOBIN A1C
HEMOGLOBIN A1C: 5.8 % — AB (ref 4.8–5.6)
Mean Plasma Glucose: 119.76 mg/dL

## 2017-10-09 LAB — TSH: TSH: 1.117 u[IU]/mL (ref 0.350–4.500)

## 2017-10-09 LAB — PSA: PROSTATIC SPECIFIC ANTIGEN: 1.66 ng/mL (ref 0.00–4.00)

## 2017-10-12 ENCOUNTER — Emergency Department (HOSPITAL_COMMUNITY)
Admission: EM | Admit: 2017-10-12 | Discharge: 2017-10-12 | Disposition: A | Discharge status: Home / Self Care | Payer: Medicaid Other | Attending: Emergency Medicine | Admitting: Emergency Medicine

## 2017-10-12 ENCOUNTER — Encounter (HOSPITAL_COMMUNITY): Payer: Self-pay

## 2017-10-12 ENCOUNTER — Other Ambulatory Visit: Payer: Self-pay

## 2017-10-12 DIAGNOSIS — Z79899 Other long term (current) drug therapy: Secondary | ICD-10-CM | POA: Insufficient documentation

## 2017-10-12 DIAGNOSIS — R110 Nausea: Secondary | ICD-10-CM | POA: Diagnosis not present

## 2017-10-12 DIAGNOSIS — I10 Essential (primary) hypertension: Secondary | ICD-10-CM | POA: Diagnosis not present

## 2017-10-12 DIAGNOSIS — R197 Diarrhea, unspecified: Secondary | ICD-10-CM

## 2017-10-12 DIAGNOSIS — E119 Type 2 diabetes mellitus without complications: Secondary | ICD-10-CM | POA: Diagnosis not present

## 2017-10-12 DIAGNOSIS — R1013 Epigastric pain: Secondary | ICD-10-CM

## 2017-10-12 DIAGNOSIS — F17210 Nicotine dependence, cigarettes, uncomplicated: Secondary | ICD-10-CM | POA: Insufficient documentation

## 2017-10-12 LAB — CBC
HEMATOCRIT: 48.1 % (ref 39.0–52.0)
Hemoglobin: 17 g/dL (ref 13.0–17.0)
MCH: 31.5 pg (ref 26.0–34.0)
MCHC: 35.3 g/dL (ref 30.0–36.0)
MCV: 89.2 fL (ref 78.0–100.0)
Platelets: 182 10*3/uL (ref 150–400)
RBC: 5.39 MIL/uL (ref 4.22–5.81)
RDW: 14.3 % (ref 11.5–15.5)
WBC: 8.6 10*3/uL (ref 4.0–10.5)

## 2017-10-12 LAB — URINALYSIS, ROUTINE W REFLEX MICROSCOPIC
BACTERIA UA: NONE SEEN
Bilirubin Urine: NEGATIVE
GLUCOSE, UA: 50 mg/dL — AB
Hgb urine dipstick: NEGATIVE
KETONES UR: 20 mg/dL — AB
Leukocytes, UA: NEGATIVE
Nitrite: NEGATIVE
PROTEIN: 30 mg/dL — AB
SQUAMOUS EPITHELIAL / LPF: NONE SEEN
Specific Gravity, Urine: 1.017 (ref 1.005–1.030)
pH: 7 (ref 5.0–8.0)

## 2017-10-12 LAB — COMPREHENSIVE METABOLIC PANEL
ALBUMIN: 3.9 g/dL (ref 3.5–5.0)
ALK PHOS: 65 U/L (ref 38–126)
ALT: 16 U/L — ABNORMAL LOW (ref 17–63)
AST: 24 U/L (ref 15–41)
Anion gap: 12 (ref 5–15)
BILIRUBIN TOTAL: 1.4 mg/dL — AB (ref 0.3–1.2)
BUN: 5 mg/dL — AB (ref 6–20)
CO2: 22 mmol/L (ref 22–32)
Calcium: 9.5 mg/dL (ref 8.9–10.3)
Chloride: 102 mmol/L (ref 101–111)
Creatinine, Ser: 0.7 mg/dL (ref 0.61–1.24)
GFR calc Af Amer: >60 mL/min (ref >60)
GFR calc non Af Amer: >60 mL/min (ref >60)
GLUCOSE: 150 mg/dL — AB (ref 65–99)
POTASSIUM: 3.5 mmol/L (ref 3.5–5.1)
Sodium: 136 mmol/L (ref 135–145)
TOTAL PROTEIN: 7.6 g/dL (ref 6.5–8.1)

## 2017-10-12 LAB — LIPASE, BLOOD: Lipase: 24 U/L (ref 11–51)

## 2017-10-12 MED ORDER — LOPERAMIDE HCL 2 MG PO CAPS: 2.0000 mg | ORAL_CAPSULE | Freq: Four times a day (QID) | ORAL | 0 refills | Status: DC | PRN

## 2017-10-12 MED ORDER — ONDANSETRON 4 MG PO TBDP: 4.0000 mg | ORAL_TABLET | Freq: Three times a day (TID) | ORAL | 0 refills | Status: DC | PRN

## 2017-10-12 MED ORDER — ONDANSETRON HCL 4 MG/2ML IJ SOLN
4.0000 mg | Freq: Once | INTRAMUSCULAR | Status: AC
  Administered 2017-10-12: 4 mg via INTRAVENOUS
  Filled 2017-10-12: qty 2

## 2017-10-12 MED ORDER — SODIUM CHLORIDE 0.9 % IV BOLUS (SEPSIS)
1000.0000 mL | Freq: Once | INTRAVENOUS | Status: AC
  Administered 2017-10-12: 1000 mL via INTRAVENOUS

## 2017-10-12 MED ORDER — SUCRALFATE 1 GM/10ML PO SUSP: 1.0000 g | Freq: Three times a day (TID) | ORAL | 0 refills | Status: DC

## 2017-10-12 NOTE — Discharge Instructions (Addendum)
You have been seen in the Emergency Department (ED) for abdominal pain.  Your evaluation did not identify a clear cause of your symptoms but was generally reassuring. You most likely have a virus that will resolve with time.   Please follow up as instructed above regarding today?s emergent visit and the symptoms that are bothering you.  Return to the ED if your abdominal pain worsens or fails to improve, you develop bloody vomiting, bloody diarrhea, you are unable to tolerate fluids due to vomiting, fever greater than 101, or other symptoms that concern you.

## 2017-10-12 NOTE — ED Triage Notes (Signed)
Pt states he lives with siblings who have been sick. States he has felt constipated and unable to urinate. Pt states he has hx of cirrhosis and feels as if he is dehydrated.

## 2017-10-12 NOTE — ED Provider Notes (Signed)
Emergency Department Provider Note   I have reviewed the triage vital signs and the nursing notes.   HISTORY  Chief Complaint Abdominal Pain   HPI Bradley Hull is a 67 y.o. male with PMH of DM, Hep C with cirrhosis, and HTN presents to the emergency department for evaluation of nausea, intermittent epigastric abdominal discomfort, and diarrhea.  He has had multiple family members sick with similar symptoms over the last week.  His symptoms have been present for the last 36 hours and do not seem to be getting significantly better.  He denies any radiation of abdominal discomfort.  He describes it as tightness mainly with diarrhea.  No exertional or pleuritic pain.  No chest pain or dyspnea.  No blood in the diarrhea.  Denies any fevers or shaking chills.  He has not tried any over-the-counter medications.    Past Medical History:  Diagnosis Date  . Arthritis   . Cirrhosis ([place])   . Diabetes mellitus without complication ([place])   . Hepatitis C   . History of kidney stones   . Hypertension     There are no active problems to display for this patient.   Past Surgical History:  Procedure Laterality Date  . COLONOSCOPY    . cyst removed     from spine  . ESOPHAGOGASTRODUODENOSCOPY    . kidney stone    . KNEE SURGERY Right   . MULTIPLE EXTRACTIONS WITH ALVEOLOPLASTY N/A 09/09/2016   Procedure: MULTIPLE EXTRACTION WITH ALVEOLOPLASTY - one, two, six, seven, nine, ten, eleven, sixteen, 56, twenty, twenty-one, twenty-two, twenty-four, twenty-five, twenty-six, twenty-seven, twenty-eight, twenty-nine, thirty-one, thirty-two; alveolplasty; removal bilateral mandible turi; removal cyst right mandible;  bone graft;  Surgeon: [name], DDS;  Location: [facility];  Service: Oral Su    Current Outpatient Rx  . Order #: [id number] Class: Historical Med  . Order #: [id number] Class: Historical Med  . Order #: [id number] Class: Historical Med  . Order #: [id number] Class: Historical Med  .  Order #: [id number] Class: Historical Med  . Order #: [id number] Class: Historical Med  . Order #: [id number] Class: Print  . Order #: [id number] Class: Print  . Order #: [id number] Class: Print  . Order #: [id number] Class: Print  . Order #: [id number] Class: Print  . Order #: [id number] Class: Print  . Order #: [id number] Class: Print  . Order #: [id number] Class: Print  . Order #: [id number] Class: Print    Allergies Dilaudid [hydromorphone hcl]  History reviewed. No pertinent family history.  Social History Social History   Tobacco Use  . Smoking status: Current Every Day Smoker    Packs/day: 0.25    Types: Cigarettes  . Smokeless tobacco: Never Used  Substance Use Topics  . Alcohol use: Yes    Comment: social at times  . Drug use: No    Review of Systems  Constitutional: No fever/chills Eyes: No visual changes. ENT: No sore throat. Cardiovascular: Denies chest pain. Respiratory: Denies shortness of breath. Gastrointestinal: Positive mild/occasional epigastric abdominal pain. Positive nausea, no vomiting. Positive diarrhea.  No constipation. Genitourinary: Negative for dysuria. Musculoskeletal: Negative for back pain. Skin: Negative for rash. Neurological: Negative for headaches, focal weakness or numbness.  10-point ROS otherwise negative.  ____________________________________________   PHYSICAL EXAM:  VITAL SIGNS: ED Triage Vitals  Enc Vitals Group     BP 10/12/17 1332 (!) 141/91     Pulse Rate 10/12/17 1332 89     Resp 10/12/17 1332 18     Temp 10/12/17 1332 98.8 F (37.1 C)     Temp  Source 10/12/17 1332 Oral     SpO2 10/12/17 1332 99 %     Pain Score 10/12/17 1330 5    Constitutional: Alert and oriented. Well appearing and in no acute distress. Eyes: Conjunctivae are normal. Head: Atraumatic. Nose: No congestion/rhinnorhea. Mouth/Throat: Mucous membranes are slightly dry.  Neck: No stridor. Cardiovascular: Normal rate, regular rhythm. Good peripheral  circulation. Grossly normal heart sounds.   Respiratory: Normal respiratory effort.  No retractions. Lungs CTAB. Gastrointestinal: Soft and nontender. No specific epigastric or RUQ tenderness to palpation. No distention.  Musculoskeletal: No lower extremity tenderness nor edema. No gross deformities of extremities. Neurologic:  Normal speech and language. No gross focal neurologic deficits are appreciated.  Skin:  Skin is warm, dry and intact. No rash noted.  ____________________________________________   LABS (all labs ordered are listed, but only abnormal results are displayed)  Labs Reviewed  COMPREHENSIVE METABOLIC PANEL - Abnormal; Notable for the following components:      Result Value   Glucose, Bld 150 (*)    BUN 5 (*)    ALT 16 (*)    Total Bilirubin 1.4 (*)    All other components within normal limits  URINALYSIS, ROUTINE W REFLEX MICROSCOPIC - Abnormal; Notable for the following components:   Color, Urine AMBER (*)    Glucose, UA 50 (*)    Ketones, ur 20 (*)    Protein, ur 30 (*)    All other components within normal limits  LIPASE, BLOOD  CBC   ____________________________________________  RADIOLOGY  None ____________________________________________   PROCEDURES  Procedure(s) performed:   Procedures  None ____________________________________________   INITIAL IMPRESSION / ASSESSMENT AND PLAN / ED COURSE  Pertinent labs & imaging results that were available during my care of the patient were reviewed by me and considered in my medical decision making (see chart for details).  Patient presents to the emergency department for evaluation of nausea with diarrhea over the last 36 hours.  He has multiple family members at home with similar symptoms.  He has some occasional epigastric abdominal discomfort with having diarrhea and notes decreased appetite and fluid intake.  He appears to be mild to moderately dehydrated.  Labs reviewed with no significant  abnormalities.  Plan for IV fluids, Zofran, PO challenge, reassess.   10:30 PM Labs reviewed. Patient feeling much better after IVF and Zofran. Tolerating PO at this time. Will discharge. Suspect viral etiology. No indication for CT imaging at this time.   At this time, I do not feel there is any life-threatening condition present. I have reviewed and discussed all results (EKG, imaging, lab, urine as appropriate), exam findings with patient. I have reviewed nursing notes and appropriate previous records.  I feel the patient is safe to be discharged home without further emergent workup. Discussed usual and customary return precautions. Patient and family (if present) verbalize understanding and are comfortable with this plan.  Patient will follow-up with their primary care provider. If they do not have a primary care provider, information for follow-up has been provided to them. All questions have been answered.  ____________________________________________  FINAL CLINICAL IMPRESSION(S) / ED DIAGNOSES  Final diagnoses:  Epigastric pain  Diarrhea, unspecified type  Nausea     MEDICATIONS GIVEN DURING THIS VISIT:  Medications  sodium chloride 0.9 % bolus 1,000 mL (0 mLs Intravenous Stopped 10/12/17 2324)  ondansetron (ZOFRAN) injection 4 mg (4 mg Intravenous Given 10/12/17 2132)     NEW OUTPATIENT MEDICATIONS STARTED DURING THIS VISIT:  Discharge Medication  List as of 10/12/2017 11:02 PM    START taking these medications   Details  loperamide (IMODIUM) 2 MG capsule Take 1 capsule (2 mg total) by mouth 4 (four) times daily as needed for diarrhea or loose stools., Starting Thu 10/12/2017, Print    ondansetron (ZOFRAN ODT) 4 MG disintegrating tablet Take 1 tablet (4 mg total) by mouth every 8 (eight) hours as needed for nausea or vomiting., Starting Thu 10/12/2017, Print    sucralfate (CARAFATE) 1 GM/10ML suspension Take 10 mLs (1 g total) by mouth 4 (four) times daily -  with meals and at  bedtime., Starting Thu 10/12/2017, Print        Note:  This document was prepared using Dragon voice recognition software and may include unintentional dictation errors.  Nanda Quinton, MD Emergency Medicine    Zakyla Tonche, [facility], MD 10/12/17 2350

## 2017-10-12 NOTE — ED Notes (Signed)
Pt tolerating PO liquids.

## 2017-11-07 ENCOUNTER — Encounter: Payer: Self-pay | Admitting: Specialist

## 2018-01-31 DIAGNOSIS — Z5181 Encounter for therapeutic drug level monitoring: Secondary | ICD-10-CM | POA: Diagnosis not present

## 2018-01-31 DIAGNOSIS — M179 Osteoarthritis of knee, unspecified: Secondary | ICD-10-CM | POA: Diagnosis not present

## 2018-01-31 DIAGNOSIS — Z79891 Long term (current) use of opiate analgesic: Secondary | ICD-10-CM | POA: Diagnosis not present

## 2018-01-31 DIAGNOSIS — G894 Chronic pain syndrome: Secondary | ICD-10-CM | POA: Diagnosis not present

## 2018-02-27 DIAGNOSIS — Z5181 Encounter for therapeutic drug level monitoring: Secondary | ICD-10-CM | POA: Diagnosis not present

## 2018-02-27 DIAGNOSIS — Z79891 Long term (current) use of opiate analgesic: Secondary | ICD-10-CM | POA: Diagnosis not present

## 2018-02-27 DIAGNOSIS — M179 Osteoarthritis of knee, unspecified: Secondary | ICD-10-CM | POA: Diagnosis not present

## 2018-02-27 DIAGNOSIS — G894 Chronic pain syndrome: Secondary | ICD-10-CM | POA: Diagnosis not present

## 2018-02-28 ENCOUNTER — Other Ambulatory Visit (HOSPITAL_COMMUNITY)
Admission: RE | Admit: 2018-02-28 | Discharge: 2018-02-28 | Disposition: A | Discharge status: Home / Self Care | Payer: Medicare Other | Source: Ambulatory Visit | Attending: Nurse Practitioner | Admitting: Nurse Practitioner

## 2018-02-28 DIAGNOSIS — Z029 Encounter for administrative examinations, unspecified: Secondary | ICD-10-CM | POA: Insufficient documentation

## 2018-02-28 LAB — CBC WITH DIFFERENTIAL/PLATELET
ABS IMMATURE GRANULOCYTES: 0 10*3/uL (ref 0.0–0.1)
Basophils Absolute: 0 10*3/uL (ref 0.0–0.1)
Basophils Relative: 1 %
Eosinophils Absolute: 0.3 10*3/uL (ref 0.0–0.7)
Eosinophils Relative: 6 %
HEMATOCRIT: 43.9 % (ref 39.0–52.0)
HEMOGLOBIN: 15.1 g/dL (ref 13.0–17.0)
IMMATURE GRANULOCYTES: 0 %
LYMPHS PCT: 35 %
Lymphs Abs: 2.1 10*3/uL (ref 0.7–4.0)
MCH: 32.8 pg (ref 26.0–34.0)
MCHC: 34.4 g/dL (ref 30.0–36.0)
MCV: 95.2 fL (ref 78.0–100.0)
Monocytes Absolute: 0.5 10*3/uL (ref 0.1–1.0)
Monocytes Relative: 9 %
NEUTROS PCT: 49 %
Neutro Abs: 3 10*3/uL (ref 1.7–7.7)
Platelets: 175 10*3/uL (ref 150–400)
RBC: 4.61 MIL/uL (ref 4.22–5.81)
RDW: 13.5 % (ref 11.5–15.5)
WBC: 6 10*3/uL (ref 4.0–10.5)

## 2018-02-28 LAB — COMPREHENSIVE METABOLIC PANEL
ALBUMIN: 3.5 g/dL (ref 3.5–5.0)
ALK PHOS: 108 U/L (ref 38–126)
ALT: 46 U/L — ABNORMAL HIGH (ref 0–44)
AST: 54 U/L — AB (ref 15–41)
Anion gap: 9 (ref 5–15)
BILIRUBIN TOTAL: 1 mg/dL (ref 0.3–1.2)
BUN: 8 mg/dL (ref 8–23)
CALCIUM: 9.4 mg/dL (ref 8.9–10.3)
CO2: 27 mmol/L (ref 22–32)
Chloride: 101 mmol/L (ref 98–111)
Creatinine, Ser: 0.9 mg/dL (ref 0.61–1.24)
GFR calc Af Amer: >60 mL/min (ref >60)
GFR calc non Af Amer: >60 mL/min (ref >60)
Glucose, Bld: 136 mg/dL — ABNORMAL HIGH (ref 70–99)
Potassium: 3.3 mmol/L — ABNORMAL LOW (ref 3.5–5.1)
Sodium: 137 mmol/L (ref 135–145)
TOTAL PROTEIN: 6.8 g/dL (ref 6.5–8.1)

## 2018-02-28 LAB — LIPID PANEL
CHOL/HDL RATIO: 2 ratio
Cholesterol: 138 mg/dL (ref 0–200)
HDL: 69 mg/dL (ref >40)
LDL CALC: 54 mg/dL (ref 0–99)
TRIGLYCERIDES: 73 mg/dL (ref <150)
VLDL: 15 mg/dL (ref 0–40)

## 2018-02-28 LAB — HEMOGLOBIN A1C
Hgb A1c MFr Bld: 5.6 % (ref 4.8–5.6)
Mean Plasma Glucose: 114.02 mg/dL

## 2018-02-28 LAB — PSA: Prostatic Specific Antigen: 3.6 ng/mL (ref 0.00–4.00)

## 2018-02-28 LAB — TSH: TSH: 1.852 u[IU]/mL (ref 0.350–4.500)

## 2018-03-12 ENCOUNTER — Emergency Department (HOSPITAL_COMMUNITY)
Admission: EM | Admit: 2018-03-12 | Discharge: 2018-03-12 | Disposition: A | Discharge status: Home / Self Care | Payer: Medicare Other | Attending: Emergency Medicine | Admitting: Emergency Medicine

## 2018-03-12 ENCOUNTER — Emergency Department (HOSPITAL_COMMUNITY): Payer: Medicare Other

## 2018-03-12 ENCOUNTER — Encounter (HOSPITAL_COMMUNITY): Payer: Self-pay | Admitting: Emergency Medicine

## 2018-03-12 DIAGNOSIS — I10 Essential (primary) hypertension: Secondary | ICD-10-CM | POA: Insufficient documentation

## 2018-03-12 DIAGNOSIS — R1030 Lower abdominal pain, unspecified: Secondary | ICD-10-CM | POA: Insufficient documentation

## 2018-03-12 DIAGNOSIS — R000 Tachycardia, unspecified: Secondary | ICD-10-CM | POA: Diagnosis not present

## 2018-03-12 DIAGNOSIS — R339 Retention of urine, unspecified: Secondary | ICD-10-CM | POA: Diagnosis not present

## 2018-03-12 DIAGNOSIS — E119 Type 2 diabetes mellitus without complications: Secondary | ICD-10-CM | POA: Insufficient documentation

## 2018-03-12 DIAGNOSIS — F17210 Nicotine dependence, cigarettes, uncomplicated: Secondary | ICD-10-CM | POA: Insufficient documentation

## 2018-03-12 DIAGNOSIS — R197 Diarrhea, unspecified: Secondary | ICD-10-CM | POA: Diagnosis not present

## 2018-03-12 DIAGNOSIS — Z79899 Other long term (current) drug therapy: Secondary | ICD-10-CM | POA: Diagnosis not present

## 2018-03-12 DIAGNOSIS — R1013 Epigastric pain: Secondary | ICD-10-CM | POA: Diagnosis not present

## 2018-03-12 DIAGNOSIS — N200 Calculus of kidney: Secondary | ICD-10-CM | POA: Diagnosis not present

## 2018-03-12 DIAGNOSIS — Z87442 Personal history of urinary calculi: Secondary | ICD-10-CM | POA: Insufficient documentation

## 2018-03-12 LAB — CBC
HEMATOCRIT: 49.5 % (ref 39.0–52.0)
HEMOGLOBIN: 16.9 g/dL (ref 13.0–17.0)
MCH: 32.3 pg (ref 26.0–34.0)
MCHC: 34.1 g/dL (ref 30.0–36.0)
MCV: 94.5 fL (ref 78.0–100.0)
Platelets: 291 10*3/uL (ref 150–400)
RBC: 5.24 MIL/uL (ref 4.22–5.81)
RDW: 13.2 % (ref 11.5–15.5)
WBC: 9.8 10*3/uL (ref 4.0–10.5)

## 2018-03-12 LAB — COMPREHENSIVE METABOLIC PANEL
ALT: 43 U/L (ref 0–44)
ANION GAP: 20 — AB (ref 5–15)
AST: 63 U/L — ABNORMAL HIGH (ref 15–41)
Albumin: 4.1 g/dL (ref 3.5–5.0)
Alkaline Phosphatase: 80 U/L (ref 38–126)
BILIRUBIN TOTAL: 3 mg/dL — AB (ref 0.3–1.2)
BUN: 17 mg/dL (ref 8–23)
CO2: 18 mmol/L — ABNORMAL LOW (ref 22–32)
Calcium: 9.9 mg/dL (ref 8.9–10.3)
Chloride: 103 mmol/L (ref 98–111)
Creatinine, Ser: 1.31 mg/dL — ABNORMAL HIGH (ref 0.61–1.24)
GFR, EST NON AFRICAN AMERICAN: 56 mL/min — AB (ref >60)
Glucose, Bld: 100 mg/dL — ABNORMAL HIGH (ref 70–99)
POTASSIUM: 3.5 mmol/L (ref 3.5–5.1)
Sodium: 141 mmol/L (ref 135–145)
TOTAL PROTEIN: 8.6 g/dL — AB (ref 6.5–8.1)

## 2018-03-12 LAB — URINALYSIS, ROUTINE W REFLEX MICROSCOPIC
BACTERIA UA: NONE SEEN
BILIRUBIN URINE: NEGATIVE
Glucose, UA: NEGATIVE mg/dL
Ketones, ur: 80 mg/dL — AB
LEUKOCYTES UA: NEGATIVE
NITRITE: NEGATIVE
Protein, ur: 100 mg/dL — AB
Specific Gravity, Urine: 1.018 (ref 1.005–1.030)
pH: 5 (ref 5.0–8.0)

## 2018-03-12 LAB — LIPASE, BLOOD: LIPASE: 31 U/L (ref 11–51)

## 2018-03-12 MED ORDER — ONDANSETRON HCL 4 MG/2ML IJ SOLN
4.0000 mg | Freq: Once | INTRAMUSCULAR | Status: AC
  Administered 2018-03-12: 4 mg via INTRAVENOUS
  Filled 2018-03-12: qty 2

## 2018-03-12 MED ORDER — SODIUM CHLORIDE 0.9 % IV BOLUS
1000.0000 mL | Freq: Once | INTRAVENOUS | Status: AC
  Administered 2018-03-12: 1000 mL via INTRAVENOUS

## 2018-03-12 MED ORDER — CEPHALEXIN 500 MG PO CAPS: 500.0000 mg | ORAL_CAPSULE | Freq: Four times a day (QID) | ORAL | 0 refills | Status: DC

## 2018-03-12 MED ORDER — LIDOCAINE HCL URETHRAL/MUCOSAL 2 % EX GEL
1.0000 "application " | Freq: Once | CUTANEOUS | Status: AC
  Administered 2018-03-12: 1 via TOPICAL
  Filled 2018-03-12: qty 20

## 2018-03-12 NOTE — Discharge Instructions (Addendum)
These return for any problem.  Follow-up with urology as instructed for further treatment of your urinary retention.

## 2018-03-12 NOTE — ED Triage Notes (Signed)
Pt reports abdominal pain and inability to urinate. Pt states he has the urge to go but "feels like there is a cork in there."  Pt has been feeling poorly and states he did not eat or drink.

## 2018-03-12 NOTE — ED Notes (Signed)
Bladder scan performed. Scanner showed 243ml.

## 2018-03-12 NOTE — ED Notes (Signed)
Declined W/C at D/C and was escorted to lobby by RN. 

## 2018-03-12 NOTE — ED Notes (Signed)
PT DC with urinary leg bag and information  For care. Pt also has a night bag. OPt understands to follow up with Out Pt. Md.

## 2018-03-12 NOTE — ED Provider Notes (Signed)
[facility] EMERGENCY DEPARTMENT Provider Note   CSN: [id number] Arrival date & time: 03/12/18  0160     History   Chief Complaint Chief Complaint  Patient presents with  . Abdominal Pain  . Urinary Retention    HPI Bradley Hull is a 67 y.o. male.  67 year old male with prior medical history as detailed below presents with complaint of difficulty urinating.  Patient reports that he has been unable to urinate since early this morning.  He complains of feeling "full" in his lower abdomen.  Patient denies associated fever, nausea, vomiting, bowel movement change, or other complaint.  Patient denies a prior history of urinary retention or history of prostatic enlargement.  The history is provided by the patient.  Urinary Frequency  This is a new problem. The current episode started 6 to 12 hours ago. The problem occurs rarely. The problem has not changed since onset.Pertinent negatives include no chest pain, no abdominal pain, no headaches and no shortness of breath. Nothing aggravates the symptoms. Nothing relieves the symptoms. He has tried nothing for the symptoms.    Past Medical History:  Diagnosis Date  . Arthritis   . Cirrhosis ([place])   . Diabetes mellitus without complication ([place])   . Hepatitis C   . History of kidney stones   . Hypertension     There are no active problems to display for this patient.   Past Surgical History:  Procedure Laterality Date  . COLONOSCOPY    . cyst removed     from spine  . ESOPHAGOGASTRODUODENOSCOPY    . kidney stone    . KNEE SURGERY Right   . MULTIPLE EXTRACTIONS WITH ALVEOLOPLASTY N/A 09/09/2016   Procedure: MULTIPLE EXTRACTION WITH ALVEOLOPLASTY - one, two, six, seven, nine, ten, eleven, sixteen, 20, twenty, twenty-one, twenty-two, twenty-four, twenty-five, twenty-six, twenty-seven, twenty-eight, twenty-nine, thirty-one, thirty-two; alveolplasty; removal bilateral mandible turi; removal cyst right  mandible;  bone graft;  Surgeon: [name], DDS;  Location: [facility];  Service: Oral Su        Home Medications    Prior to Admission medications   Medication Sig Start Date End Date Taking? Authorizing Provider  amLODipine (NORVASC) 5 MG tablet Take 5 mg by mouth daily.    Yes [provider]  carvedilol (COREG) 6.25 MG tablet Take 6.25 mg by mouth daily.    Yes [provider]  diphenhydrAMINE (BENADRYL) 25 mg capsule Take 1-2 tabs q 6 h prn itching 09/09/16  Yes [name], DDS  DULoxetine (CYMBALTA) 30 MG capsule Take 30 mg by mouth daily.   Yes [provider]  hydrochlorothiazide (HYDRODIURIL) 25 MG tablet Take 25 mg by mouth daily.    Yes [provider]  HYDROcodone-acetaminophen (NORCO/VICODIN) 5-325 MG tablet Take 1 tablet by mouth every 8 (eight) hours as needed for moderate pain.  10/23/13  Yes [provider]  cephALEXin (KEFLEX) 500 MG capsule Take 1 capsule (500 mg total) by mouth 4 (four) times daily. 03/12/18   Valarie Merino, MD  clindamycin (CLEOCIN) 300 MG capsule Take 1 capsule (300 mg total) by mouth 3 (three) times daily. Patient not taking: Reported on 10/12/2017 09/09/16   [name], DDS  loperamide (IMODIUM) 2 MG capsule Take 1 capsule (2 mg total) by mouth 4 (four) times daily as needed for diarrhea or loose stools. Patient not taking: Reported on 03/12/2018 10/12/17   Margette Fast, MD  meloxicam (MOBIC) 7.5 MG tablet Take 1 tablet (7.5 mg total)  by mouth daily. Patient not taking: Reported on 10/12/2017 09/08/17   Fransico Meadow, PA-C  ondansetron (ZOFRAN ODT) 4 MG disintegrating tablet Take 1 tablet (4 mg total) by mouth every 8 (eight) hours as needed for nausea or vomiting. Patient not taking: Reported on 03/12/2018 10/12/17   Long, Wonda Olds, MD  oxyCODONE (ROXICODONE) 5 MG immediate release tablet Take 1 tablet (5 mg total) by mouth every 8 (eight) hours as needed for severe pain. Patient not taking: Reported on  10/12/2017 07/20/17   Muthersbaugh, Jarrett Soho, PA-C  oxyCODONE-acetaminophen (PERCOCET) 10-325 MG tablet Take 1-2 tablets by mouth every 4 (four) hours as needed for pain. Patient not taking: Reported on 10/12/2017 09/09/16   [name], DDS  sucralfate (CARAFATE) 1 GM/10ML suspension Take 10 mLs (1 g total) by mouth 4 (four) times daily -  with meals and at bedtime. Patient not taking: Reported on 03/12/2018 10/12/17   Long, Wonda Olds, MD  tamsulosin (FLOMAX) 0.4 MG CAPS capsule Take 0.4 mg by mouth daily. 03/08/18   [provider]  traMADol (ULTRAM) 50 MG tablet Take 1 tablet (50 mg total) by mouth every 6 (six) hours as needed. Patient not taking: Reported on 10/12/2017 09/08/17   Sidney Ace    Family History No family history on file.  Social History Social History   Tobacco Use  . Smoking status: Current Every Day Smoker    Packs/day: 0.25    Types: Cigarettes  . Smokeless tobacco: Never Used  Substance Use Topics  . Alcohol use: Yes    Comment: social at times  . Drug use: No     Allergies   Dilaudid [hydromorphone hcl]   Review of Systems Review of Systems  Respiratory: Negative for shortness of breath.   Cardiovascular: Negative for chest pain.  Gastrointestinal: Negative for abdominal pain.  Genitourinary: Positive for difficulty urinating.  Neurological: Negative for headaches.  All other systems reviewed and are negative.    Physical Exam Updated Vital Signs BP (!) 159/102 (BP Location: Right Arm)   Pulse 98   Temp 98.7 F (37.1 C) (Oral)   Resp 18   Ht 5\' 6"  (1.676 m)   Wt 81.6 kg   SpO2 100%   BMI 29.05 kg/m   Physical Exam  Constitutional: He is oriented to person, place, and time. He appears well-developed and well-nourished. No distress.  HENT:  Head: Normocephalic and atraumatic.  Mouth/Throat: Oropharynx is clear and moist.  Eyes: Pupils are equal, round, and reactive to light. Conjunctivae and EOM are normal.  Neck: Normal  range of motion. Neck supple.  Cardiovascular: Normal rate, regular rhythm and normal heart sounds.  Pulmonary/Chest: Effort normal and breath sounds normal. No respiratory distress.  Abdominal: Soft. He exhibits no distension. There is no tenderness.  Genitourinary:  Genitourinary Comments: Distended bladder not appreciated   Musculoskeletal: Normal range of motion. He exhibits no edema or deformity.  Neurological: He is alert and oriented to person, place, and time.  Skin: Skin is warm and dry.  Psychiatric: He has a normal mood and affect.  Nursing note and vitals reviewed.    ED Treatments / Results  Labs (all labs ordered are listed, but only abnormal results are displayed) Labs Reviewed  COMPREHENSIVE METABOLIC PANEL - Abnormal; Notable for the following components:      Result Value   CO2 18 (*)    Glucose, Bld 100 (*)    Creatinine, Ser 1.31 (*)    Total Protein 8.6 (*)  AST 63 (*)    Total Bilirubin 3.0 (*)    GFR calc non Af Amer 56 (*)    Anion gap 20 (*)    All other components within normal limits  URINALYSIS, ROUTINE W REFLEX MICROSCOPIC - Abnormal; Notable for the following components:   APPearance HAZY (*)    Hgb urine dipstick SMALL (*)    Ketones, ur 80 (*)    Protein, ur 100 (*)    All other components within normal limits  LIPASE, BLOOD  CBC    EKG None  Radiology Ct Abdomen Pelvis Wo Contrast  Result Date: 03/12/2018 CLINICAL DATA:  Unable to urinate.  Flank pain EXAM: CT ABDOMEN AND PELVIS WITHOUT CONTRAST TECHNIQUE: Multidetector CT imaging of the abdomen and pelvis was performed following the standard protocol without IV contrast. COMPARISON:  10/11/2015 FINDINGS: Lower chest: Lung bases are clear. No effusions. Heart is normal size. Hepatobiliary: Diffuse fatty infiltration of the liver. No focal hepatic abnormality. Gallbladder unremarkable. Questionable subtle nodularity to the liver surface suggesting the possibility of early cirrhosis.  Recommend clinical correlation. Pancreas: No focal abnormality or ductal dilatation. Spleen: No focal abnormality.  Normal size. Adrenals/Urinary Tract: Bilateral nonobstructing renal stones. No ureteral stones or hydronephrosis. Mild prominence of the adrenal glands bilaterally, compatible with hyperplasia. Urinary bladder unremarkable. Small low-density areas in the kidneys bilaterally, likely small cysts, stable since prior study. Stomach/Bowel: Stomach, large and small bowel grossly unremarkable. Appendix is normal. Vascular/Lymphatic: Aortic atherosclerosis. No enlarged abdominal or pelvic lymph nodes. Reproductive: Prostate enlargement with a transverse diameter of 6 cm. Other: No free fluid or free air. Musculoskeletal: Degenerative changes in the lumbar spine. No acute bony abnormality. IMPRESSION: Small bilateral nonobstructing renal stones. No ureteral stones or hydronephrosis. Low-density throughout the liver compatible with fatty infiltration. Subtle nodular appearance of the liver contour suggests the possibility of early cirrhosis. Recommend clinical correlation. Bilateral low-density lesions in the kidneys, likely small cysts. Prostate enlargement. Electronically Signed   By: Rolm Baptise M.D.   On: 03/12/2018 09:02    Procedures Procedures (including critical care time)  Medications Ordered in ED Medications  sodium chloride 0.9 % bolus 1,000 mL (1,000 mLs Intravenous New Bag/Given 03/12/18 1052)  ondansetron (ZOFRAN) injection 4 mg (4 mg Intravenous Given 03/12/18 1029)  lidocaine (XYLOCAINE) 2 % jelly 1 application (1 application Topical Given 03/12/18 1029)     Initial Impression / Assessment and Plan / ED Course  I have reviewed the triage vital signs and the nursing notes.  Pertinent labs & imaging results that were available during my care of the patient were reviewed by me and considered in my medical decision making (see chart for details).     MDM  Screen  complete  Patient is presenting with complaint of urinary retention - likely secondary to enlarged prostate.  Screening labs are without significant abnormality.  Patient felt significantly improved following placement of Foley catheter.  Patient understands need for close follow-up with urology following placement of this catheter.  At time of discharge he is comfortable.  Strict return precautions given and understood.  Final Clinical Impressions(s) / ED Diagnoses   Final diagnoses:  Urinary retention    ED Discharge Orders         Ordered    cephALEXin (KEFLEX) 500 MG capsule  4 times daily     03/12/18 1150           Valarie Merino, MD 03/12/18 1154

## 2018-03-12 NOTE — ED Triage Notes (Signed)
Bladder scan per NT at bed side  260 ml in bladder.

## 2018-03-15 ENCOUNTER — Emergency Department (HOSPITAL_COMMUNITY)
Admission: EM | Admit: 2018-03-15 | Discharge: 2018-03-15 | Disposition: A | Discharge status: Home / Self Care | Payer: Medicare Other | Attending: Emergency Medicine | Admitting: Emergency Medicine

## 2018-03-15 ENCOUNTER — Encounter (HOSPITAL_COMMUNITY): Payer: Self-pay

## 2018-03-15 ENCOUNTER — Other Ambulatory Visit: Payer: Self-pay

## 2018-03-15 DIAGNOSIS — I10 Essential (primary) hypertension: Secondary | ICD-10-CM | POA: Insufficient documentation

## 2018-03-15 DIAGNOSIS — R319 Hematuria, unspecified: Secondary | ICD-10-CM | POA: Diagnosis not present

## 2018-03-15 DIAGNOSIS — Z79899 Other long term (current) drug therapy: Secondary | ICD-10-CM | POA: Insufficient documentation

## 2018-03-15 DIAGNOSIS — Z960 Presence of urogenital implants: Secondary | ICD-10-CM | POA: Diagnosis not present

## 2018-03-15 DIAGNOSIS — E119 Type 2 diabetes mellitus without complications: Secondary | ICD-10-CM | POA: Diagnosis not present

## 2018-03-15 DIAGNOSIS — F17210 Nicotine dependence, cigarettes, uncomplicated: Secondary | ICD-10-CM | POA: Diagnosis not present

## 2018-03-15 DIAGNOSIS — Z978 Presence of other specified devices: Secondary | ICD-10-CM

## 2018-03-15 DIAGNOSIS — T83098A Other mechanical complication of other indwelling urethral catheter, initial encounter: Secondary | ICD-10-CM | POA: Diagnosis not present

## 2018-03-15 NOTE — ED Notes (Signed)
Changed out cathter bag.

## 2018-03-15 NOTE — ED Triage Notes (Signed)
Pt states he has a foley in place that is to be removed today. It was placed 3 days ago. Pt states he is here to have it removed. States it is draining correctly.

## 2018-03-15 NOTE — ED Provider Notes (Addendum)
[facility] EMERGENCY DEPARTMENT Provider Note   CSN: [id number] Arrival date & time: 03/15/18  0920     History   Chief Complaint Chief Complaint  Patient presents with  . Dysuria    HPI Bradley Hull is a 67 y.o. male with a PMHx of cirrhosis, DM2, kidney stones, HTN, and Hep C, who presents to the ED requesting to get his foley taken out.  Chart review reveals that he was seen in the ED on 03/12/18 for urinary retention, had CT abd/pelv which revealed prostatic enlargement; a foley was placed and he was sent home on Keflex, advised to f/up with urology for further care.  He states that he was told to f/up with his PCP, and his PCP told him to come here to have it removed. He states that the Foley is still draining well, he has some hematuria and occasional have some mild dysuria  around the Foley, but otherwise denies any other problems with it. He has not tried anything for, no known factors. He is maintaining it as he was instructed to do so. He is still on his antibiotics and takes them appropriately.  He states that overall he feels better than when he came in on 03/12/18, and has no other concerns or complaints at this time. He came here because he was told to come here from his PCPs office. He denies any fevers, chills, abdominal pain, nausea, vomiting, diarrhea, constipation, melena, hematochezia, or any other complaints at this time.  The history is provided by the patient and medical records. No language interpreter was used.    Past Medical History:  Diagnosis Date  . Arthritis   . Cirrhosis ([place])   . Diabetes mellitus without complication ([place])   . Hepatitis C   . History of kidney stones   . Hypertension     There are no active problems to display for this patient.   Past Surgical History:  Procedure Laterality Date  . COLONOSCOPY    . cyst removed     from spine  . ESOPHAGOGASTRODUODENOSCOPY    . kidney stone    . KNEE SURGERY Right   .  MULTIPLE EXTRACTIONS WITH ALVEOLOPLASTY N/A 09/09/2016   Procedure: MULTIPLE EXTRACTION WITH ALVEOLOPLASTY - one, two, six, seven, nine, ten, eleven, sixteen, 51, twenty, twenty-one, twenty-two, twenty-four, twenty-five, twenty-six, twenty-seven, twenty-eight, twenty-nine, thirty-one, thirty-two; alveolplasty; removal bilateral mandible turi; removal cyst right mandible;  bone graft;  Surgeon: [name], DDS;  Location: [facility];  Service: Oral Su        Home Medications    Prior to Admission medications   Medication Sig Start Date End Date Taking? Authorizing Provider  amLODipine (NORVASC) 5 MG tablet Take 5 mg by mouth daily.     [provider]  carvedilol (COREG) 6.25 MG tablet Take 6.25 mg by mouth daily.     [provider]  cephALEXin (KEFLEX) 500 MG capsule Take 1 capsule (500 mg total) by mouth 4 (four) times daily. 03/12/18   Valarie Merino, MD  clindamycin (CLEOCIN) 300 MG capsule Take 1 capsule (300 mg total) by mouth 3 (three) times daily. Patient not taking: Reported on 10/12/2017 09/09/16   [name], DDS  diphenhydrAMINE (BENADRYL) 25 mg capsule Take 1-2 tabs q 6 h prn itching 09/09/16   [name], DDS  DULoxetine (CYMBALTA) 30 MG capsule Take 30 mg by mouth daily.    [provider]  hydrochlorothiazide (HYDRODIURIL) 25 MG tablet Take 25 mg  by mouth daily.     [provider]  HYDROcodone-acetaminophen (NORCO/VICODIN) 5-325 MG tablet Take 1 tablet by mouth every 8 (eight) hours as needed for moderate pain.  10/23/13   [provider]  loperamide (IMODIUM) 2 MG capsule Take 1 capsule (2 mg total) by mouth 4 (four) times daily as needed for diarrhea or loose stools. Patient not taking: Reported on 03/12/2018 10/12/17   Long, Wonda Olds, MD  meloxicam (MOBIC) 7.5 MG tablet Take 1 tablet (7.5 mg total) by mouth daily. Patient not taking: Reported on 10/12/2017 09/08/17   Fransico Meadow, PA-C  ondansetron (ZOFRAN ODT) 4 MG  disintegrating tablet Take 1 tablet (4 mg total) by mouth every 8 (eight) hours as needed for nausea or vomiting. Patient not taking: Reported on 03/12/2018 10/12/17   Long, Wonda Olds, MD  oxyCODONE (ROXICODONE) 5 MG immediate release tablet Take 1 tablet (5 mg total) by mouth every 8 (eight) hours as needed for severe pain. Patient not taking: Reported on 10/12/2017 07/20/17   Muthersbaugh, Jarrett Soho, PA-C  oxyCODONE-acetaminophen (PERCOCET) 10-325 MG tablet Take 1-2 tablets by mouth every 4 (four) hours as needed for pain. Patient not taking: Reported on 10/12/2017 09/09/16   [name], DDS  sucralfate (CARAFATE) 1 GM/10ML suspension Take 10 mLs (1 g total) by mouth 4 (four) times daily -  with meals and at bedtime. Patient not taking: Reported on 03/12/2018 10/12/17   Long, Wonda Olds, MD  tamsulosin (FLOMAX) 0.4 MG CAPS capsule Take 0.4 mg by mouth daily. 03/08/18   [provider]  traMADol (ULTRAM) 50 MG tablet Take 1 tablet (50 mg total) by mouth every 6 (six) hours as needed. Patient not taking: Reported on 10/12/2017 09/08/17   Sidney Ace    Family History History reviewed. No pertinent family history.  Social History Social History   Tobacco Use  . Smoking status: Current Every Day Smoker    Packs/day: 0.25    Types: Cigarettes  . Smokeless tobacco: Never Used  Substance Use Topics  . Alcohol use: Yes    Comment: social at times  . Drug use: No     Allergies   Dilaudid [hydromorphone hcl]   Review of Systems Review of Systems  Constitutional: Negative for chills and fever.  Gastrointestinal: Negative for abdominal pain, blood in stool, constipation, diarrhea, nausea and vomiting.  Genitourinary: Positive for dysuria and hematuria.  Allergic/Immunologic: Positive for immunocompromised state (DM2).    Physical Exam Updated Vital Signs BP (!) 148/83 (BP Location: Right Arm)   Pulse 72   Temp 99.1 F (37.3 C) (Oral)   Resp 16   SpO2 100%   Physical  Exam  Constitutional: He is oriented to person, place, and time. Vital signs are normal. He appears well-developed and well-nourished.  Non-toxic appearance. No distress.  Afebrile, nontoxic, NAD  HENT:  Head: Normocephalic and atraumatic.  Mouth/Throat: Oropharynx is clear and moist and mucous membranes are normal.  Eyes: Conjunctivae and EOM are normal. Right eye exhibits no discharge. Left eye exhibits no discharge.  Neck: Normal range of motion. Neck supple.  Cardiovascular: Normal rate, regular rhythm, normal heart sounds and intact distal pulses. Exam reveals no gallop and no friction rub.  No murmur heard. Pulmonary/Chest: Effort normal and breath sounds normal. No respiratory distress. He has no decreased breath sounds. He has no wheezes. He has no rhonchi. He has no rales.  Abdominal: Soft. Normal appearance and bowel sounds are normal. He exhibits no distension. There is  no tenderness. There is no rigidity, no rebound, no guarding, no CVA tenderness, no tenderness at McBurney's point and negative Murphy's sign.  Soft, NTND, +BS throughout, no r/g/r, neg murphy's, neg mcburney's, no CVA TTP  Foley bag on R leg with small amount of urine in bag, scant amount of blood without clots, otherwise urine clear and not cloudy appearing.   Musculoskeletal: Normal range of motion.  Neurological: He is alert and oriented to person, place, and time. He has normal strength. No sensory deficit.  Skin: Skin is warm, dry and intact. No rash noted.  Psychiatric: He has a normal mood and affect.  Nursing note and vitals reviewed.    ED Treatments / Results  Labs (all labs ordered are listed, but only abnormal results are displayed) Labs Reviewed - No data to display  EKG None  Radiology No results found.   Ct Abdomen Pelvis Wo Contrast  Result Date: 03/12/2018 CLINICAL DATA:  Unable to urinate.  Flank pain EXAM: CT ABDOMEN AND PELVIS WITHOUT CONTRAST TECHNIQUE: Multidetector CT imaging of  the abdomen and pelvis was performed following the standard protocol without IV contrast. COMPARISON:  10/11/2015 FINDINGS: Lower chest: Lung bases are clear. No effusions. Heart is normal size. Hepatobiliary: Diffuse fatty infiltration of the liver. No focal hepatic abnormality. Gallbladder unremarkable. Questionable subtle nodularity to the liver surface suggesting the possibility of early cirrhosis. Recommend clinical correlation. Pancreas: No focal abnormality or ductal dilatation. Spleen: No focal abnormality.  Normal size. Adrenals/Urinary Tract: Bilateral nonobstructing renal stones. No ureteral stones or hydronephrosis. Mild prominence of the adrenal glands bilaterally, compatible with hyperplasia. Urinary bladder unremarkable. Small low-density areas in the kidneys bilaterally, likely small cysts, stable since prior study. Stomach/Bowel: Stomach, large and small bowel grossly unremarkable. Appendix is normal. Vascular/Lymphatic: Aortic atherosclerosis. No enlarged abdominal or pelvic lymph nodes. Reproductive: Prostate enlargement with a transverse diameter of 6 cm. Other: No free fluid or free air. Musculoskeletal: Degenerative changes in the lumbar spine. No acute bony abnormality. IMPRESSION: Small bilateral nonobstructing renal stones. No ureteral stones or hydronephrosis. Low-density throughout the liver compatible with fatty infiltration. Subtle nodular appearance of the liver contour suggests the possibility of early cirrhosis. Recommend clinical correlation. Bilateral low-density lesions in the kidneys, likely small cysts. Prostate enlargement. Electronically Signed   By: Rolm Baptise M.D.   On: 03/12/2018 09:02     Procedures Procedures (including critical care time)  Medications Ordered in ED Medications - No data to display   Initial Impression / Assessment and Plan / ED Course  I have reviewed the triage vital signs and the nursing notes.  Pertinent labs & imaging results that were  available during my care of the patient were reviewed by me and considered in my medical decision making (see chart for details).     67 y.o. male here to get his foley taken out. This was placed on 03/12/18 for urinary retention, likely 2/2 prostatic enlargement. He was told by his PCP to come back here to have it removed, has not seen urology yet. Has no issues with the foley, has had some mild dysuria at times and has hematuria but the foley is still draining very well. No abd pain or other symptoms. On exam, no abdominal tenderness, foley bag with small amount of urine in bag which has scant amount of blood but no clots and not cloudy. Long discussion had with pt regarding why he has the foley, and the procedure to remove it; advised that this should be done  by urology, as he would likely have voiding trial and post-void residuals done at that time; risks of removing today would be that he couldn't void and again would have to have it replaced. Ultimately he understood the reasons why he has the foley, and understands the proper procedure to go about getting it removed. He asks that we at least change his bag, which I will have nursing staff do. I have given him urology's number and advised him to call today to make an appt to have ongoing foley care. Discussed continuation of his abx. Doubt need for further emergent work up or intervention at this time. I explained the diagnosis and have given explicit precautions to return to the ER including for any other new or worsening symptoms. The patient understands and accepts the medical plan as it's been dictated and I have answered their questions. Discharge instructions concerning home care and prescriptions have been given. The patient is STABLE and is discharged to home in good condition.    Final Clinical Impressions(s) / ED Diagnoses   Final diagnoses:  Foley catheter in place  Hematuria, unspecified type    ED Discharge Orders    [address], [place], [place] 03/15/18 1257    Carmin Muskrat, MD 03/15/18 2139

## 2018-03-15 NOTE — Discharge Instructions (Addendum)
Continue taking your antibiotics as directed. Continue taking care of your foley exactly as instructed. Follow up with the urologist listed above, call them today to make an appointment as soon as possible for ongoing management of your foley catheter. Return to the ER for emergent changes or worsening symptoms.

## 2018-03-16 DIAGNOSIS — M542 Cervicalgia: Secondary | ICD-10-CM | POA: Diagnosis not present

## 2018-03-16 DIAGNOSIS — M5136 Other intervertebral disc degeneration, lumbar region: Secondary | ICD-10-CM | POA: Diagnosis not present

## 2018-03-20 DIAGNOSIS — R338 Other retention of urine: Secondary | ICD-10-CM | POA: Diagnosis not present

## 2018-03-28 DIAGNOSIS — M179 Osteoarthritis of knee, unspecified: Secondary | ICD-10-CM | POA: Diagnosis not present

## 2018-03-28 DIAGNOSIS — Z5181 Encounter for therapeutic drug level monitoring: Secondary | ICD-10-CM | POA: Diagnosis not present

## 2018-03-28 DIAGNOSIS — Z79891 Long term (current) use of opiate analgesic: Secondary | ICD-10-CM | POA: Diagnosis not present

## 2018-03-28 DIAGNOSIS — G894 Chronic pain syndrome: Secondary | ICD-10-CM | POA: Diagnosis not present

## 2018-04-02 DIAGNOSIS — G894 Chronic pain syndrome: Secondary | ICD-10-CM | POA: Diagnosis not present

## 2018-04-02 DIAGNOSIS — Z5181 Encounter for therapeutic drug level monitoring: Secondary | ICD-10-CM | POA: Diagnosis not present

## 2018-04-02 DIAGNOSIS — Z79891 Long term (current) use of opiate analgesic: Secondary | ICD-10-CM | POA: Diagnosis not present

## 2018-04-02 DIAGNOSIS — M179 Osteoarthritis of knee, unspecified: Secondary | ICD-10-CM | POA: Diagnosis not present

## 2018-06-25 DIAGNOSIS — F4320 Adjustment disorder, unspecified: Secondary | ICD-10-CM | POA: Diagnosis not present

## 2018-07-19 ENCOUNTER — Other Ambulatory Visit (HOSPITAL_COMMUNITY)
Admission: RE | Admit: 2018-07-19 | Discharge: 2018-07-19 | Disposition: A | Discharge status: Home / Self Care | Payer: 59 | Source: Ambulatory Visit | Attending: Ophthalmology | Admitting: Ophthalmology

## 2018-07-19 DIAGNOSIS — E119 Type 2 diabetes mellitus without complications: Secondary | ICD-10-CM | POA: Insufficient documentation

## 2018-07-19 DIAGNOSIS — I10 Essential (primary) hypertension: Secondary | ICD-10-CM | POA: Insufficient documentation

## 2018-07-19 DIAGNOSIS — E782 Mixed hyperlipidemia: Secondary | ICD-10-CM | POA: Diagnosis not present

## 2018-07-19 DIAGNOSIS — R351 Nocturia: Secondary | ICD-10-CM | POA: Insufficient documentation

## 2018-07-19 DIAGNOSIS — R5383 Other fatigue: Secondary | ICD-10-CM | POA: Diagnosis present

## 2018-07-19 LAB — COMPREHENSIVE METABOLIC PANEL
ALT: 18 U/L (ref 0–44)
AST: 27 U/L (ref 15–41)
Albumin: 3.3 g/dL — ABNORMAL LOW (ref 3.5–5.0)
Alkaline Phosphatase: 60 U/L (ref 38–126)
Anion gap: 9 (ref 5–15)
BILIRUBIN TOTAL: 0.5 mg/dL (ref 0.3–1.2)
BUN: 11 mg/dL (ref 8–23)
CHLORIDE: 107 mmol/L (ref 98–111)
CO2: 25 mmol/L (ref 22–32)
CREATININE: 0.97 mg/dL (ref 0.61–1.24)
Calcium: 9.4 mg/dL (ref 8.9–10.3)
GFR calc Af Amer: >60 mL/min (ref >60)
Glucose, Bld: 128 mg/dL — ABNORMAL HIGH (ref 70–99)
Potassium: 3.8 mmol/L (ref 3.5–5.1)
Sodium: 141 mmol/L (ref 135–145)
Total Protein: 6.5 g/dL (ref 6.5–8.1)

## 2018-07-19 LAB — LIPID PANEL
CHOLESTEROL: 136 mg/dL (ref 0–200)
HDL: 64 mg/dL (ref >40)
LDL CALC: 58 mg/dL (ref 0–99)
Total CHOL/HDL Ratio: 2.1 RATIO
Triglycerides: 69 mg/dL (ref <150)
VLDL: 14 mg/dL (ref 0–40)

## 2018-07-19 LAB — CBC WITH DIFFERENTIAL/PLATELET
ABS IMMATURE GRANULOCYTES: 0.01 10*3/uL (ref 0.00–0.07)
Basophils Absolute: 0.1 10*3/uL (ref 0.0–0.1)
Basophils Relative: 1 %
Eosinophils Absolute: 0.3 10*3/uL (ref 0.0–0.5)
Eosinophils Relative: 5 %
HEMATOCRIT: 36.6 % — AB (ref 39.0–52.0)
Hemoglobin: 12.2 g/dL — ABNORMAL LOW (ref 13.0–17.0)
IMMATURE GRANULOCYTES: 0 %
LYMPHS ABS: 2.4 10*3/uL (ref 0.7–4.0)
LYMPHS PCT: 42 %
MCH: 32 pg (ref 26.0–34.0)
MCHC: 33.3 g/dL (ref 30.0–36.0)
MCV: 96.1 fL (ref 80.0–100.0)
MONOS PCT: 8 %
Monocytes Absolute: 0.4 10*3/uL (ref 0.1–1.0)
NEUTROS ABS: 2.5 10*3/uL (ref 1.7–7.7)
NEUTROS PCT: 44 %
PLATELETS: 253 10*3/uL (ref 150–400)
RBC: 3.81 MIL/uL — ABNORMAL LOW (ref 4.22–5.81)
RDW: 12.7 % (ref 11.5–15.5)
WBC: 5.8 10*3/uL (ref 4.0–10.5)
nRBC: 0 % (ref 0.0–0.2)

## 2018-07-19 LAB — PSA: Prostatic Specific Antigen: 1.67 ng/mL (ref 0.00–4.00)

## 2018-07-19 LAB — TSH: TSH: 0.721 u[IU]/mL (ref 0.350–4.500)

## 2018-10-09 DIAGNOSIS — L290 Pruritus ani: Secondary | ICD-10-CM | POA: Diagnosis not present

## 2018-10-09 DIAGNOSIS — E782 Mixed hyperlipidemia: Secondary | ICD-10-CM | POA: Diagnosis not present

## 2018-10-09 DIAGNOSIS — F329 Major depressive disorder, single episode, unspecified: Secondary | ICD-10-CM | POA: Diagnosis not present

## 2018-10-09 DIAGNOSIS — E291 Testicular hypofunction: Secondary | ICD-10-CM | POA: Diagnosis not present

## 2018-10-09 DIAGNOSIS — F4320 Adjustment disorder, unspecified: Secondary | ICD-10-CM | POA: Diagnosis not present

## 2018-10-09 DIAGNOSIS — M25562 Pain in left knee: Secondary | ICD-10-CM | POA: Diagnosis not present

## 2018-10-09 DIAGNOSIS — I10 Essential (primary) hypertension: Secondary | ICD-10-CM | POA: Diagnosis not present

## 2018-10-09 DIAGNOSIS — N521 Erectile dysfunction due to diseases classified elsewhere: Secondary | ICD-10-CM | POA: Diagnosis not present

## 2018-10-09 DIAGNOSIS — R351 Nocturia: Secondary | ICD-10-CM | POA: Diagnosis not present

## 2018-10-09 DIAGNOSIS — Z23 Encounter for immunization: Secondary | ICD-10-CM | POA: Diagnosis not present

## 2018-10-09 DIAGNOSIS — R350 Frequency of micturition: Secondary | ICD-10-CM | POA: Diagnosis not present

## 2018-10-09 DIAGNOSIS — E1165 Type 2 diabetes mellitus with hyperglycemia: Secondary | ICD-10-CM | POA: Diagnosis not present

## 2018-11-17 IMAGING — CR DG SACRUM/COCCYX 2+V
4 series · 4 of 4 positions shown · non-contrast
Comparison: None.

CLINICAL DATA: 66 y/o  M; fall with pain of the coccyx.

EXAM:
SACRUM AND COCCYX - 2+ VIEW

[t sacrum coccyx lat]
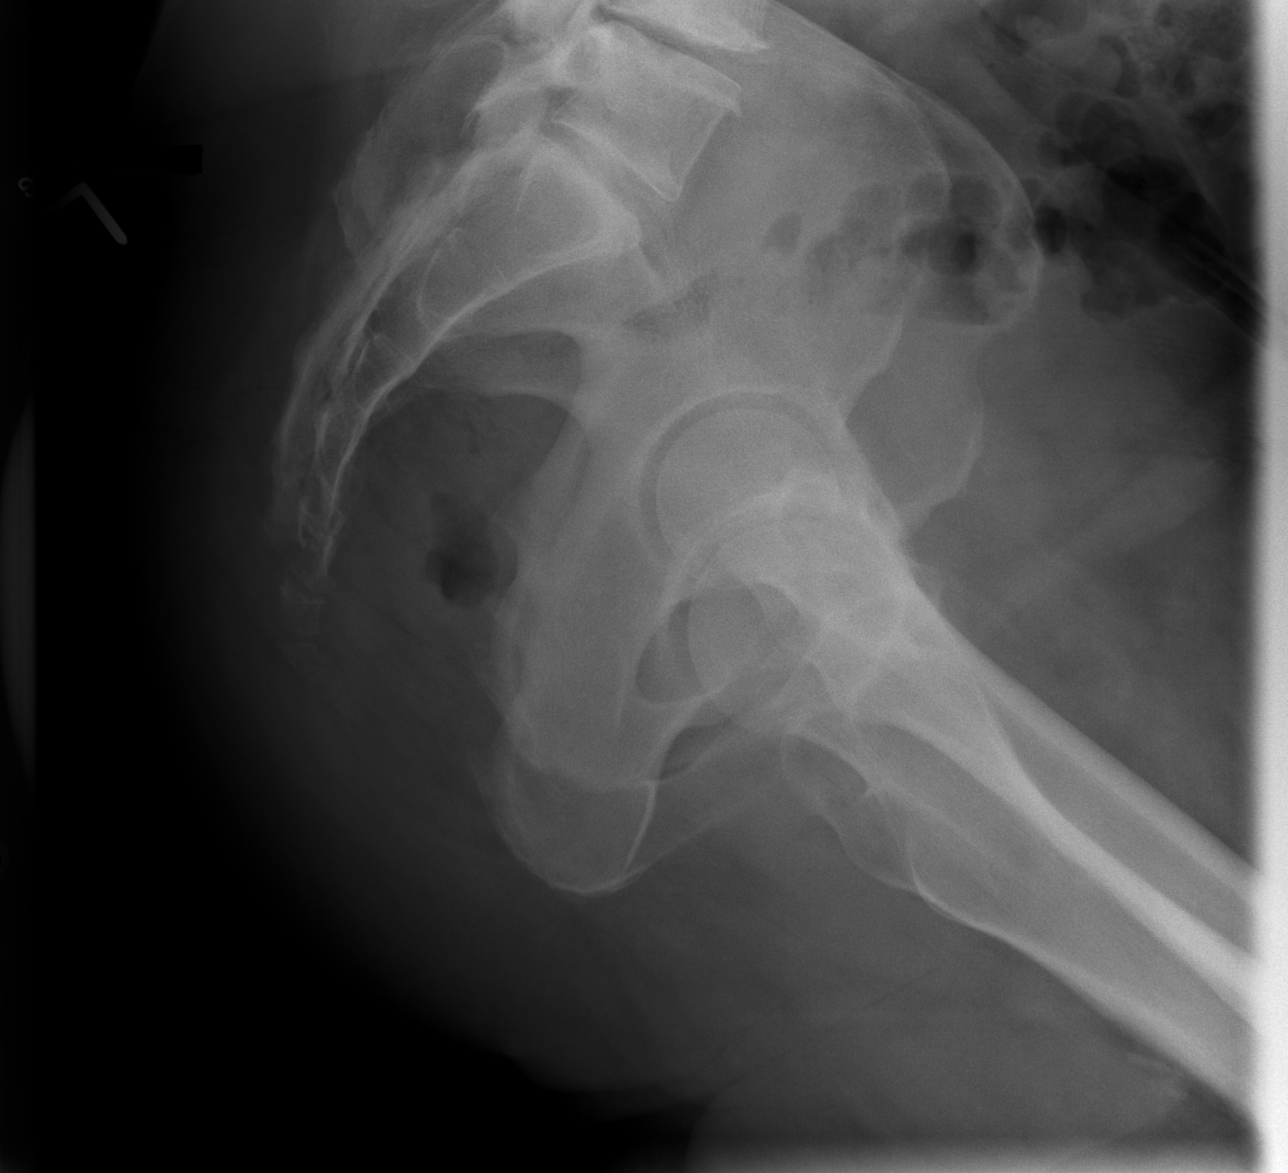

[t sacrum ap (1 of 2)]
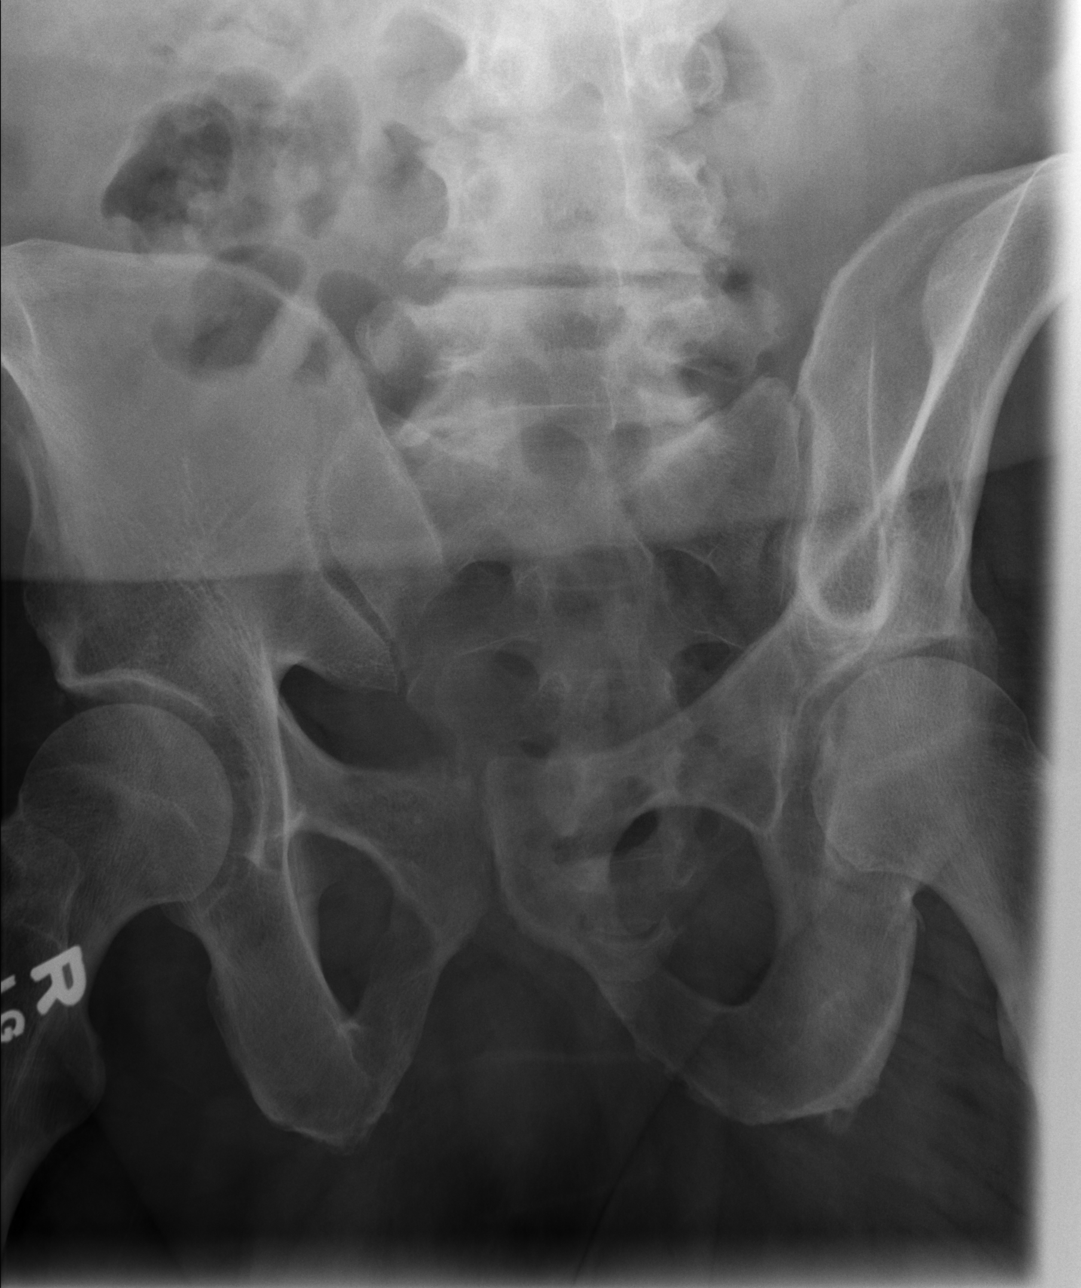

[t coccyx ap]
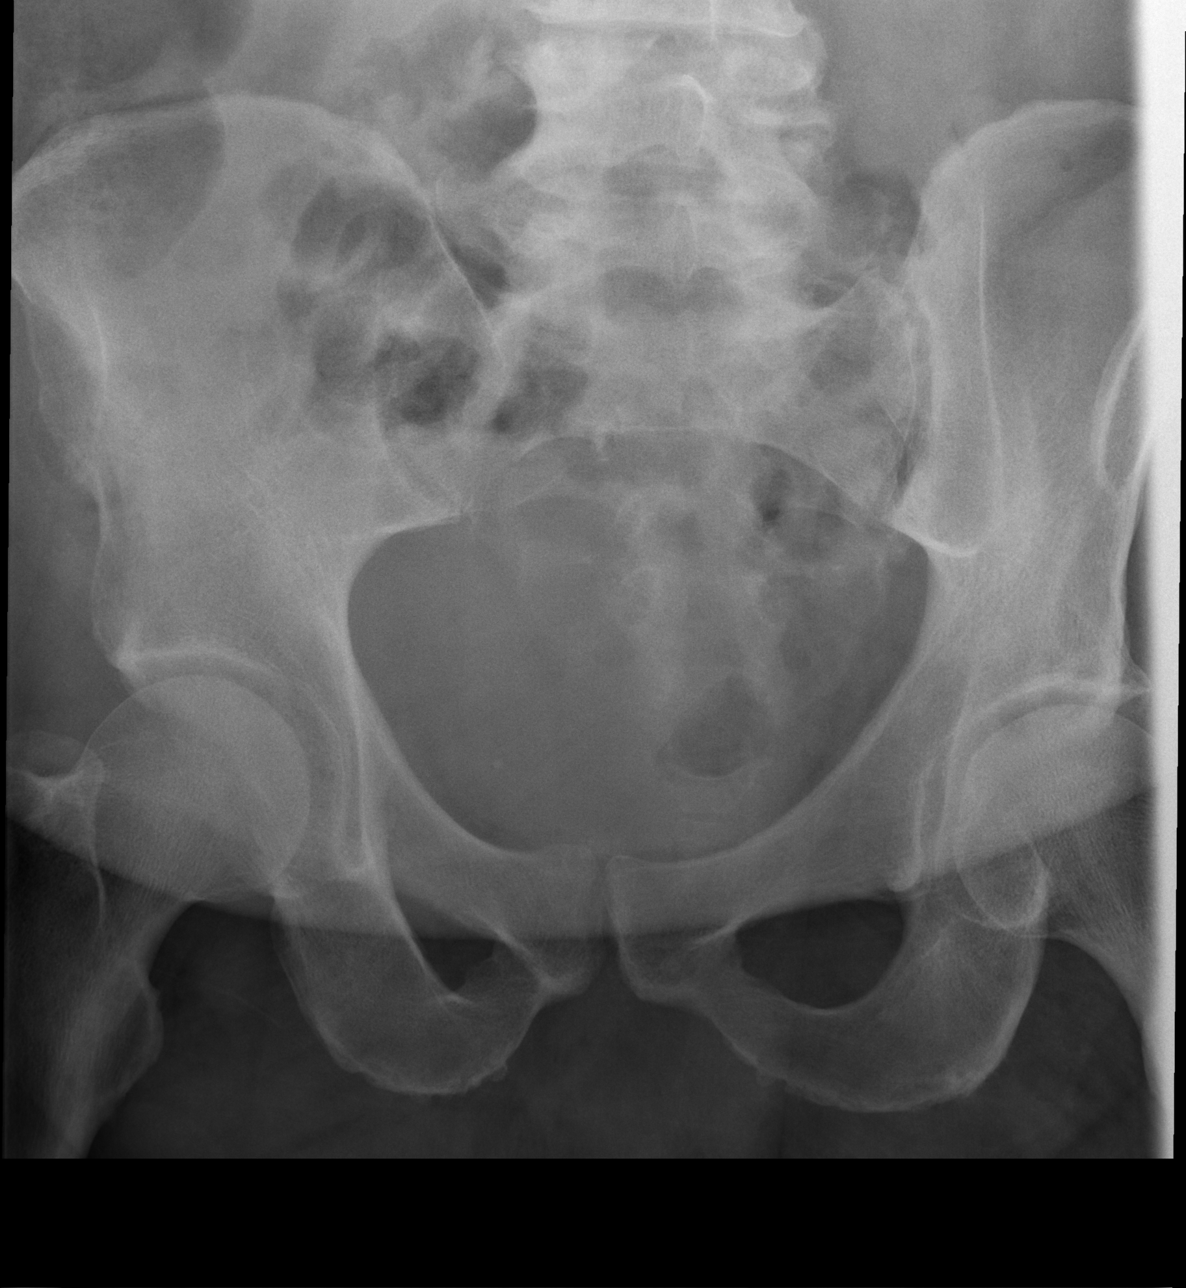

[t sacrum ap (2 of 2)]
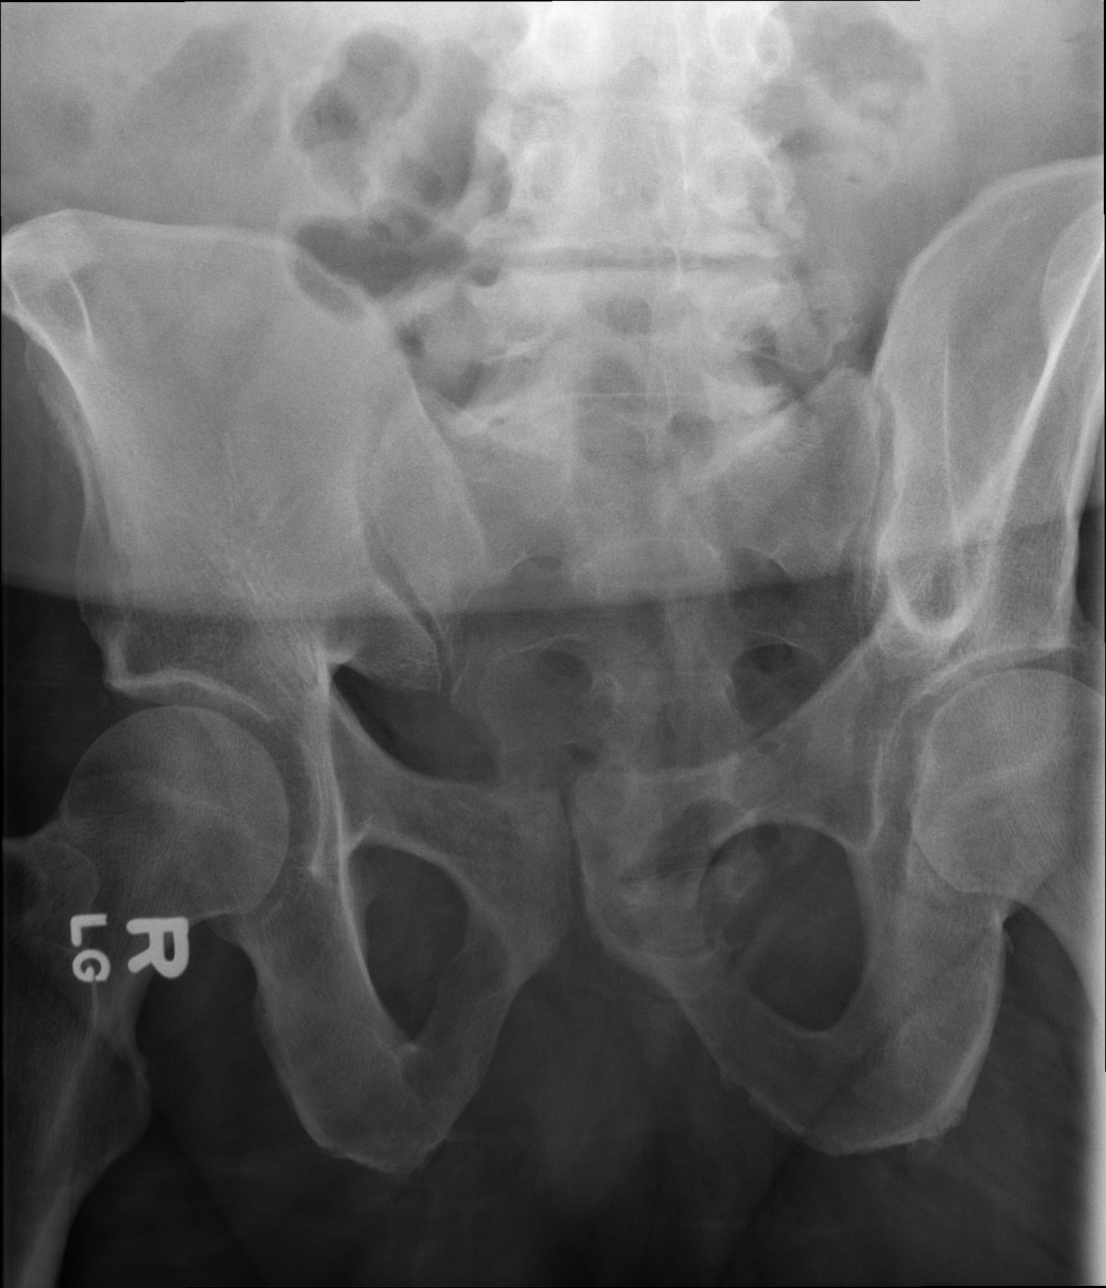

[4 of 4 positions shown; findings below may reference images not displayed]

FINDINGS: Probable minimally displaced fracture of sacrococcygeal junction. No
other fracture identified.
IMPRESSION: Probable minimally displaced fracture of sacrococcygeal junction.

By: Otf Shy M.D.

## 2020-03-20 LAB — CBC AND DIFFERENTIAL
HCT: 30 — AB (ref 41–53)
Hemoglobin: 10.7 — AB (ref 13.5–17.5)
Platelets: 217 (ref 150–399)
WBC: 13.5

## 2020-03-25 HISTORY — PX: TOTAL KNEE ARTHROPLASTY: SHX125

## 2020-03-25 HISTORY — PX: KNEE SURGERY: SHX244

## 2020-03-27 ENCOUNTER — Non-Acute Institutional Stay (SKILLED_NURSING_FACILITY): Payer: 59 | Admitting: Internal Medicine

## 2020-03-27 ENCOUNTER — Encounter: Payer: Self-pay | Admitting: Internal Medicine

## 2020-03-27 DIAGNOSIS — Z8619 Personal history of other infectious and parasitic diseases: Secondary | ICD-10-CM

## 2020-03-27 DIAGNOSIS — M25561 Pain in right knee: Secondary | ICD-10-CM

## 2020-03-27 DIAGNOSIS — K7469 Other cirrhosis of liver: Secondary | ICD-10-CM | POA: Diagnosis not present

## 2020-03-27 DIAGNOSIS — E1140 Type 2 diabetes mellitus with diabetic neuropathy, unspecified: Secondary | ICD-10-CM

## 2020-03-27 DIAGNOSIS — E785 Hyperlipidemia, unspecified: Secondary | ICD-10-CM

## 2020-03-27 DIAGNOSIS — R238 Other skin changes: Secondary | ICD-10-CM

## 2020-03-27 DIAGNOSIS — E1169 Type 2 diabetes mellitus with other specified complication: Secondary | ICD-10-CM

## 2020-03-27 DIAGNOSIS — E1165 Type 2 diabetes mellitus with hyperglycemia: Secondary | ICD-10-CM

## 2020-03-27 DIAGNOSIS — Z789 Other specified health status: Secondary | ICD-10-CM | POA: Insufficient documentation

## 2020-03-27 DIAGNOSIS — G8929 Other chronic pain: Secondary | ICD-10-CM | POA: Insufficient documentation

## 2020-03-27 DIAGNOSIS — M25562 Pain in left knee: Secondary | ICD-10-CM

## 2020-03-27 DIAGNOSIS — Z96653 Presence of artificial knee joint, bilateral: Secondary | ICD-10-CM | POA: Diagnosis not present

## 2020-03-27 DIAGNOSIS — N401 Enlarged prostate with lower urinary tract symptoms: Secondary | ICD-10-CM | POA: Insufficient documentation

## 2020-03-27 DIAGNOSIS — IMO0002 Reserved for concepts with insufficient information to code with codable children: Secondary | ICD-10-CM | POA: Insufficient documentation

## 2020-03-27 MED ORDER — OXYCODONE-ACETAMINOPHEN 5-325 MG PO TABS: 1.0000 | ORAL_TABLET | ORAL | 0 refills | Status: DC | PRN

## 2020-03-27 NOTE — Progress Notes (Signed)
Provider:  Rexene Edison. Mariea Clonts, D.O., C.M.D. Location:  [facility] Room Number: 105 Place of Service:   SNF  PCP: Burman Riis, NP Patient Care Team: Burman Riis, NP as PCP - General (Family Medicine)  Extended Emergency Contact Information Primary Emergency Contact: Karel Jarvis, [place] Phone: [phone] Relation: Brother  Code Status: FULL CODE Goals of Care: Advanced Directive information Advanced Directives 03/27/2020  Does Patient Have a Medical Advance Directive? No  Would patient like information on creating a medical advance directive? No - Patient declined   Chief Complaint  Patient presents with  . New Admit To SNF    New Admit     HPI: Patient is a 69 y.o. male with a h/o chronic knee pain/OA bilaterally, htn, DMII, prior right knee surgery, BPH, obesity, liver cirrhosis with prior hep c tx, h/o nephrolithiasis, nicotine addiction (1/2ppd x 30 yrs), and chronic pain syndrome with narcotic contract seen today for admission to [facility] s/p hospitalization from 8/27-03/26/20 at [facility] in [place] for bilateral total knee replacements by Dr. Beckey Rutter 03/20/20. He had perioperative abx.  He was placed on asa bid for dvt prophylaxis, is WBAT with walker.  Hgb was 10.7 on 03/23/20.  It appears that the initial intention was for him to go home with PT, but apparently this did not work out and he came here to SNF.  He had his pfizer covid vaccines 1/25 and 09/09/19.  He is to f/u with Dr. Beckey Rutter but no specific time given (usually these f/u are two weeks).    A girlfriend is listed as a contact:  Suzzanne Cloud.   Turns out, he was sent here with a supply of percocet tablets instead of norco which are listed in the d/c summary so orders have been changed.  Of note, he follows with Dr. Primus Bravo, pain mgt, and that's where he has a pain contract.     When  seen, he reports a weird sensation of the sides of his buttocks and thighs and then pain and numbness around his knees.  He groans in pain during positional changes in his recliner.    He has vesicles on the distal aspects of both incision sites (I've never seen this).  See media for photos from today.     He also notes that he had a large bm this morning and now feels worn out.  He did not eat breakfast and was not feeling like eating lunch but importance of eating emphasized since he is diabetic.      Past Medical History:  Diagnosis Date  . Arthritis   . Benign prostatic hyperplasia   . Cirrhosis ([place])   . Diabetes mellitus without complication ([place])   . Hepatitis C   . History of kidney stones   . Hypertension   . Nicotine addiction   . Obesity   . Osteoarthritis of both knees   . Renal lithiasis    Past Surgical History:  Procedure Laterality Date  . COLONOSCOPY    . cyst removed     from spine  . ESOPHAGOGASTRODUODENOSCOPY    . kidney stone    . KNEE SURGERY Right   . MULTIPLE EXTRACTIONS WITH ALVEOLOPLASTY N/A 09/09/2016   Procedure: MULTIPLE EXTRACTION WITH ALVEOLOPLASTY - one, two, six, seven, nine, ten, eleven, sixteen, seventeen, twenty, twenty-one, twenty-two, twenty-four, twenty-five, twenty-six, twenty-seven, twenty-eight,  twenty-nine, thirty-one, thirty-two; alveolplasty; removal bilateral mandible turi; removal cyst right mandible;  bone graft;  Surgeon: [name], DDS;  Location: [facility];  Service: Oral Su    Social History   Socioeconomic History  . Marital status: Single    Spouse name: Not on file  . Number of children: Not on file  . Years of education: Not on file  . Highest education level: Not on file  Occupational History  . Not on file  Tobacco Use  . Smoking status: Current Every Day Smoker    Packs/day: 0.25    Types: Cigarettes  . Smokeless tobacco: Never Used  Substance and Sexual Activity  . Alcohol use: Yes    Comment: social at times    . Drug use: No  . Sexual activity: Not on file  Other Topics Concern  . Not on file  Social History Narrative  . Not on file   Social Determinants of Health   Financial Resource Strain:   . Difficulty of Paying Living Expenses: Not on file  Food Insecurity:   . Worried About Charity fundraiser in the Last Year: Not on file  . Ran Out of Food in the Last Year: Not on file  Transportation Needs:   . Lack of Transportation (Medical): Not on file  . Lack of Transportation (Non-Medical): Not on file  Physical Activity:   . Days of Exercise per Week: Not on file  . Minutes of Exercise per Session: Not on file  Stress:   . Feeling of Stress : Not on file  Social Connections:   . Frequency of Communication with Friends and Family: Not on file  . Frequency of Social Gatherings with Friends and Family: Not on file  . Attends Religious Services: Not on file  . Active Member of Clubs or Organizations: Not on file  . Attends Archivist Meetings: Not on file  . Marital Status: Not on file    reports that he has been smoking cigarettes. He has been smoking about 0.25 packs per day. He has never used smokeless tobacco. He reports current alcohol use. He reports that he does not use drugs.  Functional Status Survey:    Family History  Problem Relation Age of Onset  . Cancer Mother   . Diabetes Father   . Hyperlipidemia Father     Health Maintenance  Topic Date Due  . Hepatitis C Screening  Never done  . TETANUS/TDAP  Never done  . COLONOSCOPY  Never done  . PNA vac Low Risk Adult (1 of 2 - PCV13) Never done  . INFLUENZA VACCINE  02/23/2020  . COVID-19 Vaccine  Completed    Allergies  Allergen Reactions  . Dilaudid [Hydromorphone Hcl] Itching    Outpatient Encounter Medications as of 03/27/2020  Medication Sig  . amLODipine (NORVASC) 5 MG tablet Take 5 mg by mouth daily.   . carvedilol (COREG) 6.25 MG tablet Take 6.25 mg by mouth daily.   . hydrochlorothiazide  (HYDRODIURIL) 25 MG tablet Take 25 mg by mouth daily.   . sitaGLIPtin-metformin (JANUMET) 50-1000 MG tablet Take 1 tablet by mouth. 1 tablet by mouth daily  . tamsulosin (FLOMAX) 0.4 MG CAPS capsule Take 0.4 mg by mouth daily.  . [DISCONTINUED] HYDROcodone-acetaminophen (NORCO/VICODIN) 5-325 MG tablet Take 1 tablet by mouth every 6 (six) hours as needed for moderate pain. PRN for moderate pain  Stop 04/08/2020  . oxyCODONE-acetaminophen (PERCOCET/ROXICET) 5-325 MG tablet Take 1-2 tablets by mouth every 4 (  four) hours as needed for up to 5 days for moderate pain or severe pain ((one  for moderate, two for severe)).  . [DISCONTINUED] cephALEXin (KEFLEX) 500 MG capsule Take 1 capsule (500 mg total) by mouth 4 (four) times daily.  . [DISCONTINUED] clindamycin (CLEOCIN) 300 MG capsule Take 1 capsule (300 mg total) by mouth 3 (three) times daily. (Patient not taking: Reported on 10/12/2017)  . [DISCONTINUED] diphenhydrAMINE (BENADRYL) 25 mg capsule Take 1-2 tabs q 6 h prn itching  . [DISCONTINUED] DULoxetine (CYMBALTA) 30 MG capsule Take 30 mg by mouth daily.  . [DISCONTINUED] HYDROcodone-acetaminophen (NORCO/VICODIN) 5-325 MG tablet Take 1 tablet by mouth every 8 (eight) hours as needed for moderate pain.   . [DISCONTINUED] loperamide (IMODIUM) 2 MG capsule Take 1 capsule (2 mg total) by mouth 4 (four) times daily as needed for diarrhea or loose stools. (Patient not taking: Reported on 03/12/2018)  . [DISCONTINUED] meloxicam (MOBIC) 7.5 MG tablet Take 1 tablet (7.5 mg total) by mouth daily. (Patient not taking: Reported on 10/12/2017)  . [DISCONTINUED] ondansetron (ZOFRAN ODT) 4 MG disintegrating tablet Take 1 tablet (4 mg total) by mouth every 8 (eight) hours as needed for nausea or vomiting. (Patient not taking: Reported on 03/12/2018)  . [DISCONTINUED] oxyCODONE (ROXICODONE) 5 MG immediate release tablet Take 1 tablet (5 mg total) by mouth every 8 (eight) hours as needed for severe pain. (Patient not taking:  Reported on 10/12/2017)  . [DISCONTINUED] oxyCODONE-acetaminophen (PERCOCET) 10-325 MG tablet Take 1-2 tablets by mouth every 4 (four) hours as needed for pain. (Patient not taking: Reported on 10/12/2017)  . [DISCONTINUED] sucralfate (CARAFATE) 1 GM/10ML suspension Take 10 mLs (1 g total) by mouth 4 (four) times daily -  with meals and at bedtime. (Patient not taking: Reported on 03/12/2018)  . [DISCONTINUED] traMADol (ULTRAM) 50 MG tablet Take 1 tablet (50 mg total) by mouth every 6 (six) hours as needed. (Patient not taking: Reported on 10/12/2017)   No facility-administered encounter medications on file as of 03/27/2020.    Review of Systems  Constitutional: Negative for chills and fever.       Low grade temp on vitals   HENT: Negative for congestion and sore throat.   Eyes: Negative for blurred vision.  Respiratory: Negative for cough and shortness of breath.   Cardiovascular: Negative for chest pain, palpitations and leg swelling.  Gastrointestinal: Negative for abdominal pain, blood in stool, constipation, diarrhea and melena.  Genitourinary: Negative for dysuria.  Musculoskeletal: Positive for back pain and joint pain. Negative for falls.  Skin: Negative for itching and rash.  Neurological: Positive for tingling and sensory change. Negative for dizziness and loss of consciousness.  Psychiatric/Behavioral: Negative for depression and memory loss. The patient is not nervous/anxious and does not have insomnia.     Vitals:   03/27/20 0913  BP: (!) 168/86  Pulse: (!) 101  Temp: 99.9 F (37.7 C)  SpO2: 98%  Weight: 246 lb 14.6 oz (112 kg)  Height: 5\' 6"  (1.676 m)   Body mass index is 39.85 kg/m. Physical Exam Vitals reviewed.  Constitutional:      General: He is not in acute distress.    Appearance: Normal appearance. He is obese. He is not ill-appearing or toxic-appearing.  HENT:     Head: Normocephalic and atraumatic.     Right Ear: External ear normal.     Left Ear: External  ear normal.     Nose: Nose normal.     Mouth/Throat:  Pharynx: Oropharynx is clear.  Eyes:     Extraocular Movements: Extraocular movements intact.     Conjunctiva/sclera: Conjunctivae normal.     Pupils: Pupils are equal, round, and reactive to light.  Cardiovascular:     Rate and Rhythm: Normal rate and regular rhythm.     Heart sounds: No murmur heard.   Pulmonary:     Effort: Pulmonary effort is normal.     Breath sounds: Normal breath sounds. No wheezing, rhonchi or rales.  Abdominal:     General: Bowel sounds are normal.     Palpations: Abdomen is soft.     Tenderness: There is no abdominal tenderness. There is no guarding or rebound.  Musculoskeletal:     Cervical back: Neck supple.     Right lower leg: No edema.     Left lower leg: No edema.     Comments: Groans in pain when sits up  Skin:    General: Skin is warm and dry.     Comments: Knee incision sites are clean and dry, but both have large vesicles at distal aspects, no erythema, warmth or significant swelling  Neurological:     Mental Status: He is alert.     Labs reviewed: Basic Metabolic Panel: No results for input(s): NA, K, CL, CO2, GLUCOSE, BUN, CREATININE, CALCIUM, MG, PHOS in the last 8760 hours. Liver Function Tests: No results for input(s): AST, ALT, ALKPHOS, BILITOT, PROT, ALBUMIN in the last 8760 hours. No results for input(s): LIPASE, AMYLASE in the last 8760 hours. No results for input(s): AMMONIA in the last 8760 hours. CBC: Recent Labs    03/20/20 0000  WBC 13.5  HGB 10.7*  HCT 30*  PLT 217   Cardiac Enzymes: No results for input(s): CKTOTAL, CKMB, CKMBINDEX, TROPONINI in the last 8760 hours. BNP: Invalid input(s): POCBNP Lab Results  Component Value Date   HGBA1C 5.6 02/28/2018   Lab Results  Component Value Date   TSH 0.721 07/19/2018     Assessment/Plan 1. Bilateral chronic knee pain -had bilateral knee OA, now s/p replacement -here for PT, OT--he apparently was  supposed to go straight home with outpatient therapy, but opted to come to snf   2. Status post bilateral knee replacements -Change to percocet for 5 days in place of norco--he was given a bottle of percocet, but discharge summary ordered norco so he's had that since he arrived, will then need weaning due to chronic pain contract--may need to contact Dr. Primus Bravo at [phone]  3. History of hepatitis C -appears he had prior treatment  4. Other cirrhosis of liver ([place]) -notable so must be careful about too much tylenol/percocet for him  5. Type 2 diabetes mellitus, uncontrolled, with neuropathy ([place]) -reports his last hba1c was up over 8, but it had been worse -cont janumet and monitor   6. Hyperlipidemia associated with type 2 diabetes mellitus ([place]) -not on medication for cholesterol at this time  7. Benign prostatic hyperplasia with lower urinary tract symptoms, symptom details unspecified -cont flomax  8. Vesicular rash -Leave dressings off knee wounds over weekend in case vesicles are from reaction to dressings which both myself and wound care nurse suspected could be the case; may simply be seromas--no local signs of infection are present  9. Full code status - Full code  Family/ staff Communication: d/w wound care nurse and snf nurse  Labs/tests ordered: cbc, bmp at one week  Junella Domke L. Sevan Mcbroom, D.O. [facility] [phone]  [place], [place] [address] Cell Phone (Mon-Fri 8am-5pm):  [phone] On Call:  [phone] & follow prompts after 5pm & weekends Office Phone:  [phone] Office Fax:  [phone]

## 2020-03-31 ENCOUNTER — Non-Acute Institutional Stay (SKILLED_NURSING_FACILITY): Payer: 59 | Admitting: Internal Medicine

## 2020-03-31 DIAGNOSIS — R238 Other skin changes: Secondary | ICD-10-CM | POA: Diagnosis not present

## 2020-03-31 DIAGNOSIS — M62838 Other muscle spasm: Secondary | ICD-10-CM

## 2020-03-31 DIAGNOSIS — Z96653 Presence of artificial knee joint, bilateral: Secondary | ICD-10-CM

## 2020-03-31 NOTE — Progress Notes (Signed)
Location:  [facility] Service:  SNF [phone]) Provider:  Shekia Kuper L. Mariea Clonts, D.O., C.M.D.  Burman Riis, NP  Patient Care Team: Burman Riis, NP as PCP - General (Family Medicine)  Extended Emergency Contact Information Primary Emergency Contact: Karel Jarvis, [place] Phone: [phone] Relation: Brother  Code Status:  FULL CODE Goals of care: Advanced Directive information Advanced Directives 03/27/2020  Does Patient Have a Medical Advance Directive? No  Would patient like information on creating a medical advance directive? No - Patient declined     Chief Complaint  Patient presents with  . Acute Visit    concerns about f/u with ortho and muscle spasms    HPI:  Pt is a 69 y.o. male here s/p bilateral TKAs seen today for an acute visit for concerns about following up with his orthopedist who performed his surgery (he missed his appt) and muscle spasms in his buttocks/lateral hip areas bilaterally.    He has had vesicles over the incision sites of both knees.  He was due to routine f/u postop and did not go to his initial appt.  Discussed that it is necessary for him to f/u with ortho who performed his surgery.  He agrees to go.  C/o pains when he changes positions at times and tightness through his bilateral buttocks--"sides of my rump".    Past Medical History:  Diagnosis Date  . Arthritis   . Benign prostatic hyperplasia   . Cirrhosis ([place])   . Diabetes mellitus without complication ([place])   . Hepatitis C   . History of kidney stones   . Hypertension   . Nicotine addiction   . Obesity   . Osteoarthritis of both knees   . Renal lithiasis    Past Surgical History:  Procedure Laterality Date  . COLONOSCOPY    . cyst removed     from spine  . ESOPHAGOGASTRODUODENOSCOPY    . kidney stone    . KNEE SURGERY Right   . MULTIPLE EXTRACTIONS WITH ALVEOLOPLASTY N/A 09/09/2016    Procedure: MULTIPLE EXTRACTION WITH ALVEOLOPLASTY - one, two, six, seven, nine, ten, eleven, sixteen, 60, twenty, twenty-one, twenty-two, twenty-four, twenty-five, twenty-six, twenty-seven, twenty-eight, twenty-nine, thirty-one, thirty-two; alveolplasty; removal bilateral mandible turi; removal cyst right mandible;  bone graft;  Surgeon: [name], DDS;  Location: [facility];  Service: Oral Su    Allergies  Allergen Reactions  . Dilaudid [Hydromorphone Hcl] Itching    Outpatient Encounter Medications as of 03/31/2020  Medication Sig  . amLODipine (NORVASC) 5 MG tablet Take 5 mg by mouth daily.   . carvedilol (COREG) 6.25 MG tablet Take 6.25 mg by mouth daily.   . hydrochlorothiazide (HYDRODIURIL) 25 MG tablet Take 25 mg by mouth daily.   . sitaGLIPtin-metformin (JANUMET) 50-1000 MG tablet Take 1 tablet by mouth. 1 tablet by mouth daily  . tamsulosin (FLOMAX) 0.4 MG CAPS capsule Take 0.4 mg by mouth daily.  . [DISCONTINUED] oxyCODONE-acetaminophen (PERCOCET/ROXICET) 5-325 MG tablet Take 1-2 tablets by mouth every 4 (four) hours as needed for up to 5 days for moderate pain or severe pain ((one  for moderate, two for severe)).   No facility-administered encounter medications on file as of 03/31/2020.    Review of Systems  Constitutional: Negative for chills and fever.  Respiratory: Negative for shortness of breath.   Cardiovascular: Negative for chest pain.  Gastrointestinal: Negative for abdominal pain.  Genitourinary: Negative for dysuria.  Musculoskeletal: Positive for back pain, joint pain and myalgias. Negative for falls.  Skin:       Vesicles at bottom of both incisions, now wrapped in dressings     Immunization History  Administered Date(s) Administered  . Hepatitis A, Adult 05/13/2015  . Influenza,inj,Quad PF,6+ Mos 05/13/2015  . Influenza-Unspecified 07/26/2015  . PFIZER SARS-COV-2 Vaccination 08/19/2019, 09/09/2019   Pertinent  Health Maintenance Due  Topic Date Due  .  FOOT EXAM  Never done  . OPHTHALMOLOGY EXAM  Never done  . URINE MICROALBUMIN  Never done  . COLONOSCOPY  Never done  . PNA vac Low Risk Adult (1 of 2 - PCV13) Never done  . HEMOGLOBIN A1C  08/31/2018  . INFLUENZA VACCINE  02/23/2020   No flowsheet data found. Functional Status Survey:    Vitals:   03/31/20 0626  BP: 140/69  Pulse: 72  Resp: 18  Temp: (!) 97.1 F (36.2 C)   There is no height or weight on file to calculate BMI. Physical Exam Constitutional:      Appearance: He is obese.  Cardiovascular:     Rate and Rhythm: Normal rate.  Pulmonary:     Effort: Pulmonary effort is normal.  Musculoskeletal:     Comments: Groans with positional changes and points to lateral hips/buttocks as source of pain  Skin:    Comments: Bilateral knee wounds now dressed bilaterally  Neurological:     Mental Status: He is alert.     Labs reviewed: No results for input(s): NA, K, CL, CO2, GLUCOSE, BUN, CREATININE, CALCIUM, MG, PHOS in the last 8760 hours. No results for input(s): AST, ALT, ALKPHOS, BILITOT, PROT, ALBUMIN in the last 8760 hours. Recent Labs    03/20/20 0000  WBC 13.5  HGB 10.7*  HCT 30*  PLT 217   Lab Results  Component Value Date   TSH 0.721 07/19/2018   Lab Results  Component Value Date   HGBA1C 5.6 02/28/2018   Lab Results  Component Value Date   CHOL 136 07/19/2018   HDL 64 07/19/2018   LDLCALC 58 07/19/2018   TRIG 69 07/19/2018   CHOLHDL 2.1 07/19/2018    Significant Diagnostic Results in last 30 days:  No results found.  Assessment/Plan 1. Status post bilateral knee replacements Must f/u with surgeon who did TKAs   2. Vesicular rash Would like ortho to see appearance of rash Unclear if due to dressings he had and reaction to them or just unusual wound healing Do not appear infected  3. Muscle spasms of both lower extremities Add robaxin 500mg  po bid prn x 14 days for muscle spasms  Family/ staff Communication: discussed with  DON   Cartha Rotert L. Shaqueena Mauceri, D.O. [facility] [address], [place] [address] Cell Phone (Mon-Fri 8am-5pm):  [phone] On Call:  [phone] & follow prompts after 5pm & weekends Office Phone:  [phone] Office Fax:  [phone]

## 2020-04-01 ENCOUNTER — Other Ambulatory Visit: Payer: Self-pay | Admitting: Family

## 2020-04-01 MED ORDER — OXYCODONE-ACETAMINOPHEN 5-325 MG PO TABS: 1.0000 | ORAL_TABLET | ORAL | 0 refills | Status: DC | PRN

## 2020-04-01 NOTE — Progress Notes (Signed)
Percocet 5/325 mg tablet refilled for pain # 30

## 2020-04-02 ENCOUNTER — Encounter: Payer: Self-pay | Admitting: Internal Medicine

## 2020-04-07 ENCOUNTER — Non-Acute Institutional Stay (SKILLED_NURSING_FACILITY): Payer: 59 | Admitting: Family

## 2020-04-07 ENCOUNTER — Encounter: Payer: Self-pay | Admitting: Family

## 2020-04-07 DIAGNOSIS — R2681 Unsteadiness on feet: Secondary | ICD-10-CM

## 2020-04-07 DIAGNOSIS — I10 Essential (primary) hypertension: Secondary | ICD-10-CM

## 2020-04-07 DIAGNOSIS — Z96653 Presence of artificial knee joint, bilateral: Secondary | ICD-10-CM

## 2020-04-07 DIAGNOSIS — R238 Other skin changes: Secondary | ICD-10-CM

## 2020-04-07 DIAGNOSIS — E785 Hyperlipidemia, unspecified: Secondary | ICD-10-CM

## 2020-04-07 DIAGNOSIS — M62838 Other muscle spasm: Secondary | ICD-10-CM

## 2020-04-07 DIAGNOSIS — N401 Enlarged prostate with lower urinary tract symptoms: Secondary | ICD-10-CM | POA: Diagnosis not present

## 2020-04-07 DIAGNOSIS — E1169 Type 2 diabetes mellitus with other specified complication: Secondary | ICD-10-CM

## 2020-04-07 DIAGNOSIS — E1165 Type 2 diabetes mellitus with hyperglycemia: Secondary | ICD-10-CM

## 2020-04-07 DIAGNOSIS — IMO0002 Reserved for concepts with insufficient information to code with codable children: Secondary | ICD-10-CM

## 2020-04-07 DIAGNOSIS — E1140 Type 2 diabetes mellitus with diabetic neuropathy, unspecified: Secondary | ICD-10-CM | POA: Diagnosis not present

## 2020-04-07 NOTE — Progress Notes (Signed)
Location:  [place] Room Number: 105-P Place of Service:  SNF (31)  Provider: Marlowe Sax FNP-C   PCP: Burman Riis, NP Patient Care Team: Burman Riis, NP as PCP - General (Family Medicine)  Extended Emergency Contact Information Primary Emergency Contact: Karel Jarvis, [place] Phone: [phone] Relation: Brother  Code Status: Full Code  Goals of care:  Advanced Directive information Advanced Directives 04/07/2020  Does Patient Have a Medical Advance Directive? No  Would patient like information on creating a medical advance directive? No - Patient declined     Allergies  Allergen Reactions   Dilaudid [Hydromorphone Hcl] Itching    Chief Complaint  Patient presents with   Discharge Note    Patient is seen for discharge from SNF on 04/08/20    HPI:  69 y.o. male seen today at [facility] for discharge home.He was here for short term rehabilitation for post hospital admission at [facility] in [place] from 03/20/2020 - 03/26/2020 for bilateral knee Osteoarthritis.He underwent bilateral total knee replacement on 03/20/2020 done by Dr.John Laurance Flatten.He has a medical history of Hypertension,Type 2 DM,chronic knee pain/OA,BPH,Obesity,Liver cirrhosis with hx Hepatitis C,hx of Nephrolithiasis, nicotine addiction 1/2 ppd x 30 yrs.His bilateral knee surgical incision had vesicles thought from surgical dressing.Incision was left to open air with much improvement.    He has worked well with PT/OT now stable for discharge home.He will be discharged home with [facility] health Tel# [phone]  PT/OT to continue with ROM, Exercise, Gait stability and muscle strengthening. He does not require any DME states has own Walker and Elevated toilet seat. Home health services will be arranged by facility social worker prior to discharge. Prescription  medication will be written x 1 month then patient to follow up with PCP in 1-2 weeks.Script send to [employer] [employer] # [phone] per patient's request. He denies any acute issues this visit. Facility staff report no new concerns.  Past Medical History:  Diagnosis Date   Arthritis    Benign prostatic hyperplasia    Cirrhosis ([place])    Diabetes mellitus without complication ([place])    Hepatitis C    History of kidney stones    Hypertension    Nicotine addiction    Obesity    Osteoarthritis of both knees    Renal lithiasis     Past Surgical History:  Procedure Laterality Date   COLONOSCOPY     cyst removed     from spine   ESOPHAGOGASTRODUODENOSCOPY     kidney stone     KNEE SURGERY Right    MULTIPLE EXTRACTIONS WITH ALVEOLOPLASTY N/A 09/09/2016   Procedure: MULTIPLE EXTRACTION WITH ALVEOLOPLASTY - one, two, six, seven, nine, ten, eleven, 27, 53, twenty, twenty-one, twenty-two, twenty-four, twenty-five, twenty-six, twenty-seven, twenty-eight, twenty-nine, thirty-one, thirty-two; alveolplasty; removal bilateral mandible turi; removal cyst right mandible;  bone graft;  Surgeon: [name], DDS;  Location: [place];  Service: Oral Su      reports that he has been smoking cigarettes. He has been smoking about 0.25 packs per day. He has never used smokeless tobacco. He reports current alcohol use. He reports that he does not use drugs. Social History   Socioeconomic History   Marital status: Single    Spouse name: Not on file   Number of children: Not on file   Years of education: Not on  file   Highest education level: Not on file  Occupational History   Not on file  Tobacco Use   Smoking status: Current Every Day Smoker    Packs/day: 0.25    Types: Cigarettes   Smokeless tobacco: Never Used  Substance and Sexual Activity   Alcohol use: Yes    Comment: social at times   Drug use: No   Sexual activity: Not on file  Other Topics  Concern   Not on file  Social History Narrative   Not on file   Social Determinants of Health   Financial Resource Strain:    Difficulty of Paying Living Expenses: Not on file  Food Insecurity:    Worried About [place] in the Last Year: Not on file   Ran Out of Food in the Last Year: Not on file  Transportation Needs:    Lack of Transportation (Medical): Not on file   Lack of Transportation (Non-Medical): Not on file  Physical Activity:    Days of Exercise per Week: Not on file   Minutes of Exercise per Session: Not on file  Stress:    Feeling of Stress : Not on file  Social Connections:    Frequency of Communication with Friends and Family: Not on file   Frequency of Social Gatherings with Friends and Family: Not on file   Attends Religious Services: Not on file   Active Member of [place] or Organizations: Not on file   Attends Archivist Meetings: Not on file   Marital Status: Not on file  Intimate Partner Violence:    Fear of Current or Ex-Partner: Not on file   Emotionally Abused: Not on file   Physically Abused: Not on file   Sexually Abused: Not on file    Allergies  Allergen Reactions   Dilaudid [Hydromorphone Hcl] Itching    Pertinent  Health Maintenance Due  Topic Date Due   FOOT EXAM  Never done   OPHTHALMOLOGY EXAM  Never done   URINE MICROALBUMIN  Never done   COLONOSCOPY  Never done   PNA vac Low Risk Adult (1 of 2 - PCV13) Never done   HEMOGLOBIN A1C  08/31/2018   INFLUENZA VACCINE  02/23/2020    Medications: Outpatient Encounter Medications as of 04/07/2020  Medication Sig   amLODipine (NORVASC) 5 MG tablet Take 1 tablet (5 mg total) by mouth daily.   carvedilol (COREG) 6.25 MG tablet Take 1 tablet (6.25 mg total) by mouth 2 (two) times daily with a meal.   diphenhydrAMINE (BENADRYL) 25 MG tablet Take 25 mg by mouth at bedtime as needed for itching.   hydrochlorothiazide (HYDRODIURIL) 25 MG  tablet Take 1 tablet (25 mg total) by mouth daily.   methocarbamol (ROBAXIN) 500 MG tablet Take 1 tablet (500 mg total) by mouth 2 (two) times daily as needed for up to 14 days for muscle spasms.   oxyCODONE-acetaminophen (PERCOCET/ROXICET) 5-325 MG tablet Take 1-2 tablets by mouth every 4 (four) hours as needed for up to 14 days for moderate pain or severe pain ((one  for moderate, two for severe)).   sitaGLIPtin-metformin (JANUMET) 50-1000 MG tablet Take 1 tablet by mouth daily. 1 tablet by mouth daily   tamsulosin (FLOMAX) 0.4 MG CAPS capsule Take 1 capsule (0.4 mg total) by mouth daily.   [DISCONTINUED] amLODipine (NORVASC) 5 MG tablet Take 5 mg by mouth daily.    [DISCONTINUED] carvedilol (COREG) 6.25 MG tablet Take 6.25 mg by mouth 2 (two) times  daily with a meal.    [DISCONTINUED] hydrochlorothiazide (HYDRODIURIL) 25 MG tablet Take 25 mg by mouth daily.    [DISCONTINUED] methocarbamol (ROBAXIN) 500 MG tablet Take 500 mg by mouth 2 (two) times daily as needed for muscle spasms.   [DISCONTINUED] oxyCODONE-acetaminophen (PERCOCET/ROXICET) 5-325 MG tablet Take 1-2 tablets by mouth every 4 (four) hours as needed for up to 14 days for moderate pain or severe pain ((one  for moderate, two for severe)).   [DISCONTINUED] sitaGLIPtin-metformin (JANUMET) 50-1000 MG tablet Take 1 tablet by mouth. 1 tablet by mouth daily   [DISCONTINUED] tamsulosin (FLOMAX) 0.4 MG CAPS capsule Take 0.4 mg by mouth daily.   No facility-administered encounter medications on file as of 04/07/2020.     Review of Systems  Constitutional: Negative for appetite change, chills, fatigue and fever.  HENT: Negative for congestion, rhinorrhea, sinus pressure, sinus pain, sneezing, sore throat and trouble swallowing.   Eyes: Negative for pain, discharge, redness, itching and visual disturbance.  Respiratory: Negative for cough, chest tightness, shortness of breath and wheezing.   Cardiovascular: Negative for chest pain,  palpitations and leg swelling.  Gastrointestinal: Negative for abdominal distention, abdominal pain, constipation, diarrhea, nausea and vomiting.  Endocrine: Negative for cold intolerance, heat intolerance, polydipsia, polyphagia and polyuria.  Genitourinary: Negative for difficulty urinating, dysuria, flank pain and urgency.  Musculoskeletal: Positive for arthralgias and gait problem. Negative for joint swelling and myalgias.  Skin: Negative for color change, pallor and rash.       Bilateral knee surgical incision   Neurological: Negative for dizziness, speech difficulty, weakness, light-headedness, numbness and headaches.  Hematological: Does not bruise/bleed easily.  Psychiatric/Behavioral: Negative for agitation, confusion and sleep disturbance. The patient is not nervous/anxious.     Vitals:   04/07/20 1522  BP: 130/71  Pulse: 78  Resp: 18  Temp: 98 F (36.7 C)  TempSrc: Oral  Weight: 236 lb 9.6 oz (107.3 kg)  Height: 5\' 6"  (1.676 m)   Body mass index is 38.19 kg/m. Physical Exam Vitals and nursing note reviewed.  Constitutional:      General: He is not in acute distress.    Appearance: He is obese. He is not ill-appearing.  HENT:     Head: Normocephalic.     Nose: Nose normal. No congestion or rhinorrhea.     Mouth/Throat:     Mouth: Mucous membranes are moist.     Pharynx: Oropharynx is clear. No oropharyngeal exudate or posterior oropharyngeal erythema.  Eyes:     General: No scleral icterus.       Right eye: No discharge.        Left eye: No discharge.     Extraocular Movements: Extraocular movements intact.     Conjunctiva/sclera: Conjunctivae normal.     Pupils: Pupils are equal, round, and reactive to light.  Neck:     Vascular: No carotid bruit.  Cardiovascular:     Rate and Rhythm: Normal rate and regular rhythm.     Pulses: Normal pulses.     Heart sounds: Normal heart sounds. No murmur heard.  No friction rub. No gallop.   Pulmonary:     Effort:  Pulmonary effort is normal. No respiratory distress.     Breath sounds: Normal breath sounds. No wheezing, rhonchi or rales.  Chest:     Chest wall: No tenderness.  Abdominal:     General: Bowel sounds are normal. There is no distension.     Palpations: Abdomen is soft. There is no mass.  Tenderness: There is no abdominal tenderness. There is no rebound.  Musculoskeletal:        General: No swelling or tenderness.     Cervical back: Normal range of motion. No rigidity or tenderness.     Right lower leg: No edema.     Left lower leg: No edema.     Comments: Unsteady gait ambulates with a walker   Lymphadenopathy:     Cervical: No cervical adenopathy.  Skin:    General: Skin is warm and dry.     Coloration: Skin is not pale.     Findings: No bruising, erythema or rash.     Comments: Bilateral knee surgical incision dry,clean and intact.5-7 O'clock   Neurological:     Mental Status: He is alert and oriented to person, place, and time.     Cranial Nerves: No cranial nerve deficit.     Sensory: No sensory deficit.     Motor: No weakness.     Coordination: Coordination normal.     Gait: Gait abnormal.  Psychiatric:        Mood and Affect: Mood normal.        Behavior: Behavior normal.        Thought Content: Thought content normal.        Judgment: Judgment normal.     Labs reviewed: CBC: Recent Labs    03/20/20 0000  WBC 13.5  HGB 10.7*  HCT 30*  PLT 217   Procedures and Imaging Studies During Stay: No results found.  Assessment/Plan:   1. Unsteady gait S/p bilateral knee replacement as above.will discharge home with [facility] Tel# [phone]  PT/OT to continue with ROM, Exercise, Gait stability and muscle strengthening.No DME required has own walker and elevated toilet seat. Fall and safety precautions.   2. Type 2 diabetes mellitus, uncontrolled, with neuropathy (HCC) No Hgb A1C for review.will defer to PCP  Continue on Janumet  -  sitaGLIPtin-metformin (JANUMET) 50-1000 MG tablet; Take 1 tablet by mouth daily. 1 tablet by mouth daily  Dispense: 30 tablet; Refill: 0  3. Hyperlipidemia associated with type 2 diabetes mellitus ([place]) Not on any medication.continue with dietary modification Lipid panel defer to PCP   4. Benign prostatic hyperplasia with lower urinary tract symptoms, symptom details unspecified Flomax effective. - tamsulosin (FLOMAX) 0.4 MG CAPS capsule; Take 1 capsule (0.4 mg total) by mouth daily.  Dispense: 30 capsule; Refill: 0  5. Status post bilateral knee replacements Lower part of Surgical incision on 5-7 O'clock position had vesicles which have improved thought possible reaction to surgical dressing.Incison now open to air without any signs of infection. - pain under control on percocet unable to wean off follow up with PCP has appointment with Dr.Donna Odom 04/20/2020 at 3:30 pm  -  CBC/diff and BMP with PCP  - follow up with Orthopedic specialist as directed.  - oxyCODONE-acetaminophen (PERCOCET/ROXICET) 5-325 MG tablet; Take 1-2 tablets by mouth every 4 (four) hours as needed for up to 14 days for moderate pain or severe pain ((one  for moderate, two for severe)).  Dispense: 30 tablet; Refill: 0  6. Muscle spasms of both lower extremities Continue on Robaxin  - methocarbamol (ROBAXIN) 500 MG tablet; Take 1 tablet (500 mg total) by mouth 2 (two) times daily as needed for up to 14 days for muscle spasms.  Dispense: 28 tablet; Refill: 0  7. Essential hypertension B/p well controlled. - continue on Amlodipine,Coreg and Hydrochlorothiazide - carvedilol (COREG)  6.25 MG tablet; Take 1 tablet (6.25 mg total) by mouth 2 (two) times daily with a meal.  Dispense: 60 tablet; Refill: 0 - amLODipine (NORVASC) 5 MG tablet; Take 1 tablet (5 mg total) by mouth daily.  Dispense: 30 tablet; Refill: 0 - hydrochlorothiazide (HYDRODIURIL) 25 MG tablet; Take 1 tablet (25 mg total) by mouth daily.  Dispense: 30 tablet;  Refill: 0  8. Vesicular rash Vesicles have resolved on bilateral knee Lower part of Surgical incision on 5-7 O'clock position.thought possible reaction to surgical dressing.Continue to monitor for signs of infection   Patient is being discharged with the following home health services:   - [facility] health Tel# [phone]  PT/OT to continue with ROM, Exercise, Gait stability and muscle strengthening  Patient is being discharged with the following durable medical equipment:   - None has own walker and elevated toilet seat at home.  Patient has been advised to f/u with their PCP in 1-2 weeks to for a transitions of care visit.Social services at their facility was responsible for arranging this appointment.  Pt was provided with adequate prescriptions of noncontrolled medications to reach the scheduled appointment.For controlled substances, a limited supply was provided as appropriate for the individual patient. If the pt normally receives these medications from a pain clinic or has a contract with another physician, these medications should be received from that clinic or physician only).    Future labs/tests needed:  CBC, BMP in 1-2 weeks PCP

## 2020-04-08 MED ORDER — CARVEDILOL 6.25 MG PO TABS: 6.2500 mg | ORAL_TABLET | Freq: Two times a day (BID) | ORAL | 0 refills | Status: DC

## 2020-04-08 MED ORDER — SITAGLIPTIN PHOS-METFORMIN HCL 50-1000 MG PO TABS: 1.0000 | ORAL_TABLET | Freq: Every day | ORAL | 0 refills | Status: DC

## 2020-04-08 MED ORDER — METHOCARBAMOL 500 MG PO TABS: 500.0000 mg | ORAL_TABLET | Freq: Two times a day (BID) | ORAL | 0 refills | Status: AC | PRN

## 2020-04-08 MED ORDER — OXYCODONE-ACETAMINOPHEN 5-325 MG PO TABS: 1.0000 | ORAL_TABLET | ORAL | 0 refills | Status: AC | PRN

## 2020-04-08 MED ORDER — TAMSULOSIN HCL 0.4 MG PO CAPS: 0.4000 mg | ORAL_CAPSULE | Freq: Every day | ORAL | 0 refills | Status: DC

## 2020-04-08 MED ORDER — HYDROCHLOROTHIAZIDE 25 MG PO TABS: 25.0000 mg | ORAL_TABLET | Freq: Every day | ORAL | 0 refills | Status: DC

## 2020-04-08 MED ORDER — AMLODIPINE BESYLATE 5 MG PO TABS: 5.0000 mg | ORAL_TABLET | Freq: Every day | ORAL | 0 refills | Status: DC

## 2020-05-11 ENCOUNTER — Telehealth: Payer: Self-pay

## 2020-05-11 ENCOUNTER — Encounter: Payer: Self-pay | Admitting: Family Medicine

## 2020-05-11 NOTE — Telephone Encounter (Signed)
Very sorry, I am unable to accept at this time 

## 2020-05-11 NOTE — Telephone Encounter (Signed)
error 

## 2020-05-11 NOTE — Telephone Encounter (Signed)
Patient stated that his brother Geary Rufo is a patient of yours and you agreed to see this patient. Please just let me know. Thank you

## 2020-05-20 ENCOUNTER — Ambulatory Visit: Payer: 59 | Admitting: Physician Assistant

## 2020-05-28 ENCOUNTER — Other Ambulatory Visit: Payer: Self-pay | Admitting: Family

## 2020-05-28 DIAGNOSIS — IMO0002 Reserved for concepts with insufficient information to code with codable children: Secondary | ICD-10-CM

## 2020-05-28 DIAGNOSIS — E1165 Type 2 diabetes mellitus with hyperglycemia: Secondary | ICD-10-CM

## 2020-05-29 ENCOUNTER — Ambulatory Visit (INDEPENDENT_AMBULATORY_CARE_PROVIDER_SITE_OTHER): Payer: 59 | Admitting: Physician Assistant

## 2020-05-29 ENCOUNTER — Other Ambulatory Visit: Payer: Self-pay

## 2020-05-29 ENCOUNTER — Encounter: Payer: Self-pay | Admitting: Physician Assistant

## 2020-05-29 VITALS — BP 140/86 | HR 78 | Temp 98.4°F | Resp 16 | Ht 66.0 in | Wt 227.0 lb

## 2020-05-29 DIAGNOSIS — E1140 Type 2 diabetes mellitus with diabetic neuropathy, unspecified: Secondary | ICD-10-CM

## 2020-05-29 DIAGNOSIS — E1165 Type 2 diabetes mellitus with hyperglycemia: Secondary | ICD-10-CM

## 2020-05-29 DIAGNOSIS — K7469 Other cirrhosis of liver: Secondary | ICD-10-CM

## 2020-05-29 DIAGNOSIS — E1169 Type 2 diabetes mellitus with other specified complication: Secondary | ICD-10-CM | POA: Diagnosis not present

## 2020-05-29 DIAGNOSIS — Z8619 Personal history of other infectious and parasitic diseases: Secondary | ICD-10-CM

## 2020-05-29 DIAGNOSIS — E785 Hyperlipidemia, unspecified: Secondary | ICD-10-CM

## 2020-05-29 DIAGNOSIS — R229 Localized swelling, mass and lump, unspecified: Secondary | ICD-10-CM

## 2020-05-29 DIAGNOSIS — IMO0002 Reserved for concepts with insufficient information to code with codable children: Secondary | ICD-10-CM

## 2020-05-29 MED ORDER — CEPHALEXIN 500 MG PO CAPS: 500.0000 mg | ORAL_CAPSULE | Freq: Two times a day (BID) | ORAL | 0 refills | Status: AC

## 2020-05-29 NOTE — Progress Notes (Signed)
Patient presents to clinic today to establish care. Patient previously followed by [facility] in [place], [place].   Patient with history of Diabetes Mellitus II (complication status unknown), Hep C with liver cirrhosis, HTN and BPH.   Acute Concerns: Patient endorses noting a bump on his Right thumb starting 1 week ago. Is non-painful, non-pruritic per patient. Has not gotten larger since onset. Denies trauma or injury. Did bleed initially. Denies similar lesion noted elsewhere.  Chronic Issues: Diabetes Mellitus --patient endorses longstanding history.  Has been on Janumet for the past 3 years, and tolerating well.  Denies any known history of retinopathy, nephropathy or neuropathy. Endorses eye examination up-to-date. Unsure of pneumonia vaccines  BPH -- Followed by Urology with appt on the 22nd of this month. Currently on a regimen of Tamsulosin 0.4 mg daily.    Hypertension -- On regimen of Carvedilol and HCTZ, taking as directed. Notes was previously on lisinopril for this and diabetes but was taken off of the medication; can't remember why. Patient denies chest pain, palpitations, lightheadedness, dizziness, vision changes or frequent headaches.  BP Readings from Last 3 Encounters:  05/29/20 140/86  04/07/20 130/71  03/31/20 140/69   Hepatic Cirrhosis --patient with remote history of IV drug use and subsequent hepatitis C.  Is status post treatment with cure.  Notes has been quite some time since he has had assessment or imaging of his liver.  Was followed by hepatology but has not seen in several years.  Health Maintenance: Immunizations -- patient unsure. Records requested.  Colonoscopy -- Last colonoscopy 10 years ago -- polyps. No follow up. Records requested so we can get him set up for next screen.   Past Medical History:  Diagnosis Date  . Arthritis   . Benign prostatic hyperplasia   . Cirrhosis ([place])   . Diabetes mellitus without complication ([place])   . Hepatitis  C   . History of kidney stones   . Hypertension   . Nicotine addiction   . Obesity   . Osteoarthritis of both knees   . Renal lithiasis     Past Surgical History:  Procedure Laterality Date  . COLONOSCOPY    . cyst removed     from spine  . ESOPHAGOGASTRODUODENOSCOPY    . kidney stone    . KNEE SURGERY Bilateral 03/2020  . MULTIPLE EXTRACTIONS WITH ALVEOLOPLASTY N/A 09/09/2016   Procedure: MULTIPLE EXTRACTION WITH ALVEOLOPLASTY - one, two, six, seven, nine, ten, eleven, sixteen, 40, twenty, twenty-one, twenty-two, twenty-four, twenty-five, twenty-six, twenty-seven, twenty-eight, twenty-nine, thirty-one, thirty-two; alveolplasty; removal bilateral mandible turi; removal cyst right mandible;  bone graft;  Surgeon: [name], DDS;  Location: [place];  Service: Oral Su    Current Outpatient Medications on File Prior to Visit  Medication Sig Dispense Refill  . amLODipine (NORVASC) 5 MG tablet Take 1 tablet (5 mg total) by mouth daily. 30 tablet 0  . carvedilol (COREG) 6.25 MG tablet Take 1 tablet (6.25 mg total) by mouth 2 (two) times daily with a meal. 60 tablet 0  . hydrochlorothiazide (HYDRODIURIL) 25 MG tablet Take 1 tablet (25 mg total) by mouth daily. 30 tablet 0  . sitaGLIPtin-metformin (JANUMET) 50-1000 MG tablet Take 1 tablet by mouth daily. 1 tablet by mouth daily 30 tablet 0  . tamsulosin (FLOMAX) 0.4 MG CAPS capsule Take 1 capsule (0.4 mg total) by mouth daily. 30 capsule 0   No current facility-administered medications on file prior to visit.    Allergies  Allergen Reactions  .  Dilaudid [Hydromorphone Hcl] Itching    Family History  Problem Relation Age of Onset  . Cancer Mother   . Diabetes Father   . Hyperlipidemia Father     Social History   Socioeconomic History  . Marital status: Single    Spouse name: Not on file  . Number of children: Not on file  . Years of education: Not on file  . Highest education level: Not on file  Occupational History    . Not on file  Tobacco Use  . Smoking status: Current Every Day Smoker    Packs/day: 0.25    Types: Cigarettes  . Smokeless tobacco: Never Used  Substance and Sexual Activity  . Alcohol use: Yes    Comment: social at times  . Drug use: No  . Sexual activity: Not on file  Other Topics Concern  . Not on file  Social History Narrative  . Not on file   Social Determinants of Health   Financial Resource Strain:   . Difficulty of Paying Living Expenses: Not on file  Food Insecurity:   . Worried About Charity fundraiser in the Last Year: Not on file  . Ran Out of Food in the Last Year: Not on file  Transportation Needs:   . Lack of Transportation (Medical): Not on file  . Lack of Transportation (Non-Medical): Not on file  Physical Activity:   . Days of Exercise per Week: Not on file  . Minutes of Exercise per Session: Not on file  Stress:   . Feeling of Stress : Not on file  Social Connections:   . Frequency of Communication with Friends and Family: Not on file  . Frequency of Social Gatherings with Friends and Family: Not on file  . Attends Religious Services: Not on file  . Active Member of Clubs or Organizations: Not on file  . Attends Archivist Meetings: Not on file  . Marital Status: Not on file  Intimate Partner Violence:   . Fear of Current or Ex-Partner: Not on file  . Emotionally Abused: Not on file  . Physically Abused: Not on file  . Sexually Abused: Not on file   ROS Pertinent ROS are listed in the HPI.   Resp 16   Ht 5' 6"  (1.676 m)   Wt 227 lb (103 kg)   BMI 36.64 kg/m   Physical Exam Vitals reviewed.  Constitutional:      Appearance: Normal appearance.  HENT:     Head: Normocephalic and atraumatic.  Cardiovascular:     Rate and Rhythm: Normal rate and regular rhythm.     Pulses: Normal pulses.     Heart sounds: Normal heart sounds.  Pulmonary:     Effort: Pulmonary effort is normal.  Musculoskeletal:     Cervical back: Neck  supple.  Skin:      Neurological:     General: No focal deficit present.     Mental Status: He is alert and oriented to person, place, and time.  Psychiatric:        Mood and Affect: Mood normal.     Recent Results (from the past 2160 hour(s))  CBC and differential     Status: Abnormal   Collection Time: 03/20/20 12:00 AM  Result Value Ref Range   Hemoglobin 10.7 (A) 13.5 - 17.5   HCT 30 (A) 41 - 53   Platelets 217 150 - 399   WBC 13.5     Assessment/Plan: 1. Type 2 diabetes  mellitus, uncontrolled, with neuropathy ([place]) Repeat labs today including urine microalbumin/cr ratio as he is not on ACE/ARB. Continue current medication regimen for now. Will get records to review immunization status.  - CBC w/Diff; Future - Comp Met (CMET); Future - Hemoglobin A1c; Future - Urine Microalbumin w/creat. ratio; Future  2. Hyperlipidemia associated with type 2 diabetes mellitus ([place]) Repeat fasting lipid panel today.  - Lipid Profile; Future  3. Other cirrhosis of liver ([place]) 4. History of hepatitis C History of IV drug use. None in decades. S/p treatment for hep c several years prior with cure. No follow-up in some time. Will check CMP today. Will work on getting records to review. Plan to set patient up with local Hepatologist.   5. 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 Referral to Dermatology placed for assessment and removal giving located on finger where a lot of nerve endings are present.  - Ambulatory referral to Dermatology   This visit occurred during the SARS-CoV-2 public health emergency.  Safety protocols were in place, including screening questions prior to the visit, additional usage of staff PPE, and extensive cleaning of exam room while observing appropriate contact time as indicated for disinfecting solutions.     Leeanne Rio, PA-C

## 2020-05-29 NOTE — Patient Instructions (Signed)
Please go to the lab today for blood work.  I will call you with your results. We will alter treatment regimen(s) if indicated by your results.   You will be contacted by Dermatology for further management of the area on your finger. Take the antibiotic as directed.   I will get your records so I can review them and make sure we close all care gaps that are present.   It was very nice meeting you today. Welcome to [facility]!

## 2020-06-03 ENCOUNTER — Other Ambulatory Visit (INDEPENDENT_AMBULATORY_CARE_PROVIDER_SITE_OTHER): Payer: 59

## 2020-06-03 DIAGNOSIS — E1140 Type 2 diabetes mellitus with diabetic neuropathy, unspecified: Secondary | ICD-10-CM

## 2020-06-03 DIAGNOSIS — E1169 Type 2 diabetes mellitus with other specified complication: Secondary | ICD-10-CM | POA: Diagnosis not present

## 2020-06-03 DIAGNOSIS — E1165 Type 2 diabetes mellitus with hyperglycemia: Secondary | ICD-10-CM | POA: Diagnosis not present

## 2020-06-03 DIAGNOSIS — IMO0002 Reserved for concepts with insufficient information to code with codable children: Secondary | ICD-10-CM

## 2020-06-03 DIAGNOSIS — E785 Hyperlipidemia, unspecified: Secondary | ICD-10-CM

## 2020-06-03 LAB — CBC WITH DIFFERENTIAL/PLATELET
Basophils Absolute: 0.1 10*3/uL (ref 0.0–0.1)
Basophils Relative: 0.6 % (ref 0.0–3.0)
Eosinophils Absolute: 0.3 10*3/uL (ref 0.0–0.7)
Eosinophils Relative: 4 % (ref 0.0–5.0)
HCT: 42.9 % (ref 39.0–52.0)
Hemoglobin: 14.1 g/dL (ref 13.0–17.0)
Lymphocytes Relative: 25.4 % (ref 12.0–46.0)
Lymphs Abs: 2.2 10*3/uL (ref 0.7–4.0)
MCHC: 33 g/dL (ref 30.0–36.0)
MCV: 89.7 fl (ref 78.0–100.0)
Monocytes Absolute: 0.6 10*3/uL (ref 0.1–1.0)
Monocytes Relative: 6.7 % (ref 3.0–12.0)
Neutro Abs: 5.4 10*3/uL (ref 1.4–7.7)
Neutrophils Relative %: 63.3 % (ref 43.0–77.0)
Platelets: 280 10*3/uL (ref 150.0–400.0)
RBC: 4.78 Mil/uL (ref 4.22–5.81)
RDW: 15.4 % (ref 11.5–15.5)
WBC: 8.6 10*3/uL (ref 4.0–10.5)

## 2020-06-03 LAB — COMPREHENSIVE METABOLIC PANEL
ALT: 12 U/L (ref 0–53)
AST: 14 U/L (ref 0–37)
Albumin: 4.3 g/dL (ref 3.5–5.2)
Alkaline Phosphatase: 99 U/L (ref 39–117)
BUN: 9 mg/dL (ref 6–23)
CO2: 27 mEq/L (ref 19–32)
Calcium: 9.8 mg/dL (ref 8.4–10.5)
Chloride: 102 mEq/L (ref 96–112)
Creatinine, Ser: 0.84 mg/dL (ref 0.40–1.50)
GFR: 90.37 mL/min (ref >60.00)
Glucose, Bld: 165 mg/dL — ABNORMAL HIGH (ref 70–99)
Potassium: 3.3 mEq/L — ABNORMAL LOW (ref 3.5–5.1)
Sodium: 140 mEq/L (ref 135–145)
Total Bilirubin: 0.9 mg/dL (ref 0.2–1.2)
Total Protein: 7.8 g/dL (ref 6.0–8.3)

## 2020-06-03 LAB — LIPID PANEL
Cholesterol: 131 mg/dL (ref 0–200)
HDL: 49.6 mg/dL (ref >39.00)
LDL Cholesterol: 64 mg/dL (ref 0–99)
NonHDL: 81.41
Total CHOL/HDL Ratio: 3
Triglycerides: 89 mg/dL (ref 0.0–149.0)
VLDL: 17.8 mg/dL (ref 0.0–40.0)

## 2020-06-03 LAB — MICROALBUMIN / CREATININE URINE RATIO
Creatinine,U: 468.5 mg/dL
Microalb Creat Ratio: 1.9 mg/g (ref 0.0–30.0)
Microalb, Ur: 8.8 mg/dL — ABNORMAL HIGH (ref 0.0–1.9)

## 2020-06-03 LAB — HEMOGLOBIN A1C: Hgb A1c MFr Bld: 6.5 % (ref 4.6–6.5)

## 2020-06-10 ENCOUNTER — Other Ambulatory Visit: Payer: Self-pay | Admitting: Emergency Medicine

## 2020-06-10 DIAGNOSIS — E1129 Type 2 diabetes mellitus with other diabetic kidney complication: Secondary | ICD-10-CM

## 2020-06-10 MED ORDER — LOSARTAN POTASSIUM 25 MG PO TABS: 25.0000 mg | ORAL_TABLET | Freq: Every day | ORAL | 1 refills | Status: DC

## 2020-06-23 ENCOUNTER — Other Ambulatory Visit: Payer: Self-pay | Admitting: Family

## 2020-06-23 DIAGNOSIS — I10 Essential (primary) hypertension: Secondary | ICD-10-CM

## 2020-06-25 ENCOUNTER — Telehealth: Payer: Self-pay

## 2020-06-25 NOTE — Telephone Encounter (Signed)
°  LAST APPOINTMENT DATE: 05/29/2020   NEXT APPOINTMENT DATE:@Visit  date not found  MEDICATION:amLODipine (NORVASC) 5 MG tablet  PHARMACY: [place], [place]: Patient is completely out!   Please advise

## 2020-06-26 ENCOUNTER — Other Ambulatory Visit: Payer: Self-pay

## 2020-06-26 DIAGNOSIS — I10 Essential (primary) hypertension: Secondary | ICD-10-CM

## 2020-06-26 MED ORDER — AMLODIPINE BESYLATE 5 MG PO TABS: 5.0000 mg | ORAL_TABLET | Freq: Every day | ORAL | 0 refills | Status: DC

## 2020-06-26 NOTE — Telephone Encounter (Signed)
30 day script sent to pharmacy

## 2020-07-03 ENCOUNTER — Encounter: Payer: Self-pay | Admitting: Physician Assistant

## 2020-07-03 ENCOUNTER — Other Ambulatory Visit: Payer: Self-pay

## 2020-07-03 ENCOUNTER — Ambulatory Visit (INDEPENDENT_AMBULATORY_CARE_PROVIDER_SITE_OTHER): Payer: 59 | Admitting: Physician Assistant

## 2020-07-03 VITALS — BP 138/70 | HR 86 | Temp 98.4°F | Resp 17 | Ht 66.0 in | Wt 233.4 lb

## 2020-07-03 DIAGNOSIS — E1159 Type 2 diabetes mellitus with other circulatory complications: Secondary | ICD-10-CM

## 2020-07-03 DIAGNOSIS — Z23 Encounter for immunization: Secondary | ICD-10-CM | POA: Diagnosis not present

## 2020-07-03 DIAGNOSIS — K7469 Other cirrhosis of liver: Secondary | ICD-10-CM | POA: Diagnosis not present

## 2020-07-03 DIAGNOSIS — I152 Hypertension secondary to endocrine disorders: Secondary | ICD-10-CM

## 2020-07-03 DIAGNOSIS — I10 Essential (primary) hypertension: Secondary | ICD-10-CM

## 2020-07-03 DIAGNOSIS — Z1211 Encounter for screening for malignant neoplasm of colon: Secondary | ICD-10-CM

## 2020-07-03 MED ORDER — AMLODIPINE BESYLATE 5 MG PO TABS: 5.0000 mg | ORAL_TABLET | Freq: Every day | ORAL | 1 refills | Status: DC

## 2020-07-03 NOTE — Progress Notes (Signed)
Patient presents to clinic today for follow-up of hypertension associated with DM II. At last visit patient was placed on losartan 25 mg daily. Urine microalbumin slightly elevated on lab check but normal Alb/Cr ratio. Patient also encouraged to follow DASH diet. Since last visit patient endorses, taking medication as directed and tolerating well. Patient denies chest pain, palpitations, lightheadedness, dizziness, vision changes or frequent headaches.  BP Readings from Last 3 Encounters:  07/03/20 138/70  05/29/20 140/86  04/07/20 130/71   Patient also now agreeable to screening colonoscopy and referral back to hepatology for history of cirrhosis.   Past Medical History:  Diagnosis Date  . Arthritis   . Benign prostatic hyperplasia   . Cirrhosis ([place])   . Diabetes mellitus without complication ([employer])   . Hepatitis C   . History of kidney stones   . Hypertension   . Nicotine addiction   . Obesity   . Osteoarthritis of both knees   . Renal lithiasis     Current Outpatient Medications on File Prior to Visit  Medication Sig Dispense Refill  . amLODipine (NORVASC) 5 MG tablet Take 1 tablet (5 mg total) by mouth daily. 30 tablet 0  . carvedilol (COREG) 6.25 MG tablet Take 1 tablet (6.25 mg total) by mouth 2 (two) times daily with a meal. 60 tablet 0  . hydrochlorothiazide (HYDRODIURIL) 25 MG tablet Take 1 tablet (25 mg total) by mouth daily. 30 tablet 0  . HYDROcodone-acetaminophen (NORCO/VICODIN) 5-325 MG tablet Take 3-5 tablets by mouth as needed for moderate pain.    Marland Kitchen losartan (COZAAR) 25 MG tablet Take 1 tablet (25 mg total) by mouth daily. 30 tablet 1  . sitaGLIPtin-metformin (JANUMET) 50-1000 MG tablet Take 1 tablet by mouth daily. 1 tablet by mouth daily 30 tablet 0  . tamsulosin (FLOMAX) 0.4 MG CAPS capsule Take 1 capsule (0.4 mg total) by mouth daily. 30 capsule 0   No current facility-administered medications on file prior to visit.    Allergies  Allergen Reactions  .  Dilaudid [Hydromorphone Hcl] Itching    Family History  Problem Relation Age of Onset  . Cancer Mother   . Diabetes Father   . Hyperlipidemia Father     Social History   Socioeconomic History  . Marital status: Single    Spouse name: Not on file  . Number of children: Not on file  . Years of education: Not on file  . Highest education level: Not on file  Occupational History  . Not on file  Tobacco Use  . Smoking status: Current Every Day Smoker    Packs/day: 0.25    Types: Cigarettes  . Smokeless tobacco: Never Used  Vaping Use  . Vaping Use: Never used  Substance and Sexual Activity  . Alcohol use: Yes    Comment: social at times  . Drug use: No  . Sexual activity: Not on file  Other Topics Concern  . Not on file  Social History Narrative  . Not on file   Social Determinants of Health   Financial Resource Strain: Not on file  Food Insecurity: Not on file  Transportation Needs: Not on file  Physical Activity: Not on file  Stress: Not on file  Social Connections: Not on file   Review of Systems - See HPI.  All other ROS are negative.  There were no vitals taken for this visit.  Physical Exam Vitals reviewed.  Constitutional:      Appearance: Normal appearance.  HENT:  Head: Normocephalic and atraumatic.  Cardiovascular:     Rate and Rhythm: Normal rate and regular rhythm.     Pulses: Normal pulses.     Heart sounds: Normal heart sounds.  Musculoskeletal:     Cervical back: Neck supple.  Neurological:     General: No focal deficit present.     Mental Status: He is alert and oriented to person, place, and time.  Psychiatric:        Mood and Affect: Mood normal.     Recent Results (from the past 2160 hour(s))  CBC w/Diff     Status: None   Collection Time: 06/03/20  9:36 AM  Result Value Ref Range   WBC 8.6 4.0 - 10.5 K/uL   RBC 4.78 4.22 - 5.81 Mil/uL   Hemoglobin 14.1 13.0 - 17.0 g/dL   HCT 42.9 39.0 - 52.0 %   MCV 89.7 78.0 - 100.0 fl    MCHC 33.0 30.0 - 36.0 g/dL   RDW 15.4 11.5 - 15.5 %   Platelets 280.0 150.0 - 400.0 K/uL   Neutrophils Relative % 63.3 43.0 - 77.0 %   Lymphocytes Relative 25.4 12.0 - 46.0 %   Monocytes Relative 6.7 3.0 - 12.0 %   Eosinophils Relative 4.0 0.0 - 5.0 %   Basophils Relative 0.6 0.0 - 3.0 %   Neutro Abs 5.4 1.4 - 7.7 K/uL   Lymphs Abs 2.2 0.7 - 4.0 K/uL   Monocytes Absolute 0.6 0.1 - 1.0 K/uL   Eosinophils Absolute 0.3 0.0 - 0.7 K/uL   Basophils Absolute 0.1 0.0 - 0.1 K/uL  Comp Met (CMET)     Status: Abnormal   Collection Time: 06/03/20  9:36 AM  Result Value Ref Range   Sodium 140 135 - 145 mEq/L   Potassium 3.3 (L) 3.5 - 5.1 mEq/L   Chloride 102 96 - 112 mEq/L   CO2 27 19 - 32 mEq/L   Glucose, Bld 165 (H) 70 - 99 mg/dL   BUN 9 6 - 23 mg/dL   Creatinine, Ser 0.84 0.40 - 1.50 mg/dL   Total Bilirubin 0.9 0.2 - 1.2 mg/dL   Alkaline Phosphatase 99 39 - 117 U/L   AST 14 0 - 37 U/L   ALT 12 0 - 53 U/L   Total Protein 7.8 6.0 - 8.3 g/dL   Albumin 4.3 3.5 - 5.2 g/dL   GFR 90.37 >60.00 mL/min    Comment: Calculated using the CKD-EPI Creatinine Equation (2021)   Calcium 9.8 8.4 - 10.5 mg/dL  Hemoglobin A1c     Status: None   Collection Time: 06/03/20  9:36 AM  Result Value Ref Range   Hgb A1c MFr Bld 6.5 4.6 - 6.5 %    Comment: Glycemic Control Guidelines for People with Diabetes:Non Diabetic:  <6%Goal of Therapy: <7%Additional Action Suggested:  >8%   Lipid Profile     Status: None   Collection Time: 06/03/20  9:36 AM  Result Value Ref Range   Cholesterol 131 0 - 200 mg/dL    Comment: ATP III Classification       Desirable:  < 200 mg/dL               Borderline High:  200 - 239 mg/dL          High:  > = 240 mg/dL   Triglycerides 89.0 0.0 - 149.0 mg/dL    Comment: Normal:  <150 mg/dLBorderline High:  150 - 199 mg/dL   HDL 49.60 >39.00 mg/dL  VLDL 17.8 0.0 - 40.0 mg/dL   LDL Cholesterol 64 0 - 99 mg/dL   Total CHOL/HDL Ratio 3     Comment:                Men          Women1/2  Average Risk     3.4          3.3Average Risk          5.0          4.42X Average Risk          9.6          7.13X Average Risk          15.0          11.0                       NonHDL 81.41     Comment: NOTE:  Non-HDL goal should be 30 mg/dL higher than patient's LDL goal (i.e. LDL goal of < 70 mg/dL, would have non-HDL goal of < 100 mg/dL)  Urine Microalbumin w/creat. ratio     Status: Abnormal   Collection Time: 06/03/20  9:59 AM  Result Value Ref Range   Microalb, Ur 8.8 (H) 0.0 - 1.9 mg/dL   Creatinine,U 468.5 mg/dL   Microalb Creat Ratio 1.9 0.0 - 30.0 mg/g    Assessment/Plan: No problem-specific Assessment & Plan notes found for this encounter.  This visit occurred during the SARS-CoV-2 public health emergency.  Safety protocols were in place, including screening questions prior to the visit, additional usage of staff PPE, and extensive cleaning of exam room while observing appropriate contact time as indicated for disinfecting solutions.     Leeanne Rio, PA-C

## 2020-07-03 NOTE — Patient Instructions (Signed)
Please continue current medication regimen. Immunizations were updated today.   You will be contacted by liver specialist to follow-up regarding biliary cirrhosis and by general gastroenterology for screening colonoscopy, etc.   Follow-up with me in 2 months for reassessment of diabetes.  Sooner if needed.

## 2020-07-06 ENCOUNTER — Telehealth: Payer: Self-pay | Admitting: Physician Assistant

## 2020-07-06 NOTE — Telephone Encounter (Signed)
Left message for patient to schedule Annual Wellness Visit.  Please schedule with Nurse Health Advisor Martha Stanley, RN at [facility]  

## 2020-07-07 ENCOUNTER — Telehealth: Payer: Self-pay | Admitting: Physician Assistant

## 2020-07-07 DIAGNOSIS — E1129 Type 2 diabetes mellitus with other diabetic kidney complication: Secondary | ICD-10-CM

## 2020-07-07 MED ORDER — LOSARTAN POTASSIUM 25 MG PO TABS: 25.0000 mg | ORAL_TABLET | Freq: Every day | ORAL | 1 refills | Status: DC

## 2020-07-07 NOTE — Telephone Encounter (Signed)
Rx sent to the preferred patient pharmacy  

## 2020-07-07 NOTE — Telephone Encounter (Signed)
..  Medication Refills  Medication:  Losartan potassim   Pharmacy:  [facility]  ** Let patient know to contact pharmacy at the end of the day to make sure medication is ready.**  ** Please notify patient to allow 48-72 hours to process.**  ** Encourage patient to contact the pharmacy for refills or they can request refills through [facility]**  Clinical Fills out below:   Last refill:  QTY:  Refill Date:    Other Comments:   Okay for refill?  Please advise.

## 2020-07-08 ENCOUNTER — Ambulatory Visit: Payer: 59 | Admitting: Physician Assistant

## 2020-07-08 LAB — PSA: PSA: 2.62

## 2020-07-30 ENCOUNTER — Other Ambulatory Visit: Payer: Self-pay | Admitting: Nurse Practitioner

## 2020-07-30 DIAGNOSIS — K7469 Other cirrhosis of liver: Secondary | ICD-10-CM

## 2020-08-04 ENCOUNTER — Other Ambulatory Visit: Payer: Self-pay | Admitting: Family

## 2020-08-04 DIAGNOSIS — I10 Essential (primary) hypertension: Secondary | ICD-10-CM

## 2020-08-11 ENCOUNTER — Encounter: Payer: Self-pay | Admitting: Physician Assistant

## 2020-08-13 ENCOUNTER — Other Ambulatory Visit: Payer: 59

## 2020-08-26 ENCOUNTER — Encounter: Payer: Self-pay | Admitting: Physician Assistant

## 2020-08-26 ENCOUNTER — Ambulatory Visit (INDEPENDENT_AMBULATORY_CARE_PROVIDER_SITE_OTHER): Payer: 59 | Admitting: Physician Assistant

## 2020-08-26 ENCOUNTER — Other Ambulatory Visit: Payer: Self-pay

## 2020-08-26 ENCOUNTER — Other Ambulatory Visit (INDEPENDENT_AMBULATORY_CARE_PROVIDER_SITE_OTHER): Payer: 59

## 2020-08-26 VITALS — BP 110/70 | HR 88 | Ht 65.0 in | Wt 231.4 lb

## 2020-08-26 DIAGNOSIS — B1920 Unspecified viral hepatitis C without hepatic coma: Secondary | ICD-10-CM

## 2020-08-26 DIAGNOSIS — R1084 Generalized abdominal pain: Secondary | ICD-10-CM

## 2020-08-26 DIAGNOSIS — I864 Gastric varices: Secondary | ICD-10-CM

## 2020-08-26 DIAGNOSIS — K7460 Unspecified cirrhosis of liver: Secondary | ICD-10-CM | POA: Diagnosis not present

## 2020-08-26 DIAGNOSIS — Z86010 Personal history of colonic polyps: Secondary | ICD-10-CM

## 2020-08-26 LAB — COMPREHENSIVE METABOLIC PANEL
ALT: 18 U/L (ref 0–53)
AST: 20 U/L (ref 0–37)
Albumin: 4.6 g/dL (ref 3.5–5.2)
Alkaline Phosphatase: 82 U/L (ref 39–117)
BUN: 8 mg/dL (ref 6–23)
CO2: 29 mEq/L (ref 19–32)
Calcium: 10.6 mg/dL — ABNORMAL HIGH (ref 8.4–10.5)
Chloride: 102 mEq/L (ref 96–112)
Creatinine, Ser: 0.83 mg/dL (ref 0.40–1.50)
GFR: 90.55 mL/min (ref >60.00)
Glucose, Bld: 81 mg/dL (ref 70–99)
Potassium: 3.7 mEq/L (ref 3.5–5.1)
Sodium: 139 mEq/L (ref 135–145)
Total Bilirubin: 0.7 mg/dL (ref 0.2–1.2)
Total Protein: 8 g/dL (ref 6.0–8.3)

## 2020-08-26 LAB — PROTIME-INR
INR: 1.1 ratio — ABNORMAL HIGH (ref 0.8–1.0)
Prothrombin Time: 11.9 s (ref 9.6–13.1)

## 2020-08-26 MED ORDER — PLENVU 140 G PO SOLR: 1.0000 | ORAL | 0 refills | Status: DC

## 2020-08-26 NOTE — Patient Instructions (Signed)
If you are age 68 or older, your body mass index should be between 23-30. Your Body mass index is 38.5 kg/m. If this is out of the aforementioned range listed, please consider follow up with your Primary Care Provider.  If you are age 60 or younger, your body mass index should be between 19-25. Your Body mass index is 38.5 kg/m. If this is out of the aformentioned range listed, please consider follow up with your Primary Care Provider.   Your provider has requested that you go to the basement level for lab work before leaving today. Press "B" on the elevator. The lab is located at the first door on the left as you exit the elevator.  You have been scheduled for an endoscopy and colonoscopy. Please follow the written instructions given to you at your visit today. Please pick up your prep supplies at the pharmacy within the next 1-3 days. If you use inhalers (even only as needed), please bring them with you on the day of your procedure.   Follow up pending the results of your labs, Endoscopy, and Colonoscopy.  Thank you for entrusting me with your care and choosing [facility].  Amy Esterwood, PA-C

## 2020-08-26 NOTE — Progress Notes (Signed)
Subjective:    Patient ID: Bradley Hull, male    DOB: 10-29-1950, 70 y.o.   MRN: [id number]  HPI Bradley Hull is a pleasant 70 year old African-American male, new to GI today, referred by Roosevelt Locks NP/[facility] hepatology for esophageal variceal screening.  He has history of hepatitis C induced cirrhosis.  He is status post treatment with sustained viral response.  He had previously been followed at [facility] Patient also has new complaint of noted generalized abdominal pain which has been present over the past 2 to 3 months.  He says he is not ever experienced this pain in the past.  He describes it as a burning sensation which is fairly constant.  He has not had any associated nausea or vomiting, no changes in appetite and no changes in pain with p.o. intake.  He does feel that he has had some increased in abdominal girth but has also gained about 50 pounds over the past year and a half.  Bowel movements generally regular, some episodes of loose stools, no melena or hematochezia.  He has also experiencing some chronic fatigue which has been present for multiple months. He also related some discomfort in his tailbone and/or rectum.  He says that he fractured his coccyx about a year ago and cannot tell if this pain is related to that or not.  Is also had external itching off and on. He did have prior colonoscopy he reported about 10 years ago however by review of care everywhere this was done at [facility] in 2015 with finding of adenomatous polyp and was recommended to have 5-year interval follow-up. Last EGD was done in 2016 also [facility] with reported varices.  He has not had prior variceal hemorrhage.  Patient mentions today that he is also being evaluated by urology/Dr. Jeffie Pollock and has been found to have an abnormality of prostate for which he is undergoing work-up.  He reports that he is scheduled for CT scan on February 11.  Most recent labs 05/2020; hemoglobin 14/hematocrit of 42/MCV 89 platelets 280, LFTs within  normal limits, no  PT/INR     Review of Systems, Pertinent positive and negative review of systems were noted in the above HPI section.  All other review of systems was otherwise negative.  Outpatient Encounter Medications as of 08/26/2020  Medication Sig  . amLODipine (NORVASC) 5 MG tablet Take 1 tablet (5 mg total) by mouth daily.  . carvedilol (COREG) 6.25 MG tablet Take 1 tablet (6.25 mg total) by mouth 2 (two) times daily with a meal.  . hydrochlorothiazide (HYDRODIURIL) 25 MG tablet Take 1 tablet (25 mg total) by mouth daily.  Marland Kitchen HYDROcodone-acetaminophen (NORCO/VICODIN) 5-325 MG tablet Take 3-5 tablets by mouth as needed for moderate pain.  Marland Kitchen losartan (COZAAR) 25 MG tablet Take 1 tablet (25 mg total) by mouth daily.  Marland Kitchen PEG-KCl-NaCl-NaSulf-Na Asc-C (PLENVU) 140 g SOLR Take 1 kit by mouth as directed. Manufacturer's coupon Universal coupon code:BIN: P2366821; GROUP: WP80998338; PCN: [id number]; ID: [id number]; PAY NO MORE $50; NO prior authorization  . sitaGLIPtin-metformin (JANUMET) 50-1000 MG tablet Take 1 tablet by mouth daily. 1 tablet by mouth daily  . tamsulosin (FLOMAX) 0.4 MG CAPS capsule Take 1 capsule (0.4 mg total) by mouth daily.   No facility-administered encounter medications on file as of 08/26/2020.   Allergies  Allergen Reactions  . Dilaudid [Hydromorphone Hcl] Itching   Patient Active Problem List   Diagnosis Date Noted  . Bilateral chronic knee pain 03/27/2020  . Status post bilateral knee  replacements 03/27/2020  . History of hepatitis C 03/27/2020  . Other cirrhosis of liver ([place]) 03/27/2020  . Type 2 diabetes mellitus, uncontrolled, with neuropathy ([place]) 03/27/2020  . Hyperlipidemia associated with type 2 diabetes mellitus ([place]) 03/27/2020  . Benign prostatic hyperplasia with lower urinary tract symptoms 03/27/2020  . Vesicular rash 03/27/2020  . Full code status 03/27/2020  . Alcohol abuse 12/02/2013  . Obesity 12/02/2013  . Chronic hepatitis C ([place]) 11/14/2013    Social History   Socioeconomic History  . Marital status: Single    Spouse name: Not on file  . Number of children: 2  . Years of education: Not on file  . Highest education level: Not on file  Occupational History  . Occupation: retired  Tobacco Use  . Smoking status: Current Every Day Smoker    Packs/day: 0.25    Types: Cigarettes  . Smokeless tobacco: Never Used  Vaping Use  . Vaping Use: Never used  Substance and Sexual Activity  . Alcohol use: Yes    Comment: 3 per day  . Drug use: No  . Sexual activity: Not on file  Other Topics Concern  . Not on file  Social History Narrative  . Not on file   Social Determinants of Health   Financial Resource Strain: Not on file  Food Insecurity: Not on file  Transportation Needs: Not on file  Physical Activity: Not on file  Stress: Not on file  Social Connections: Not on file  Intimate Partner Violence: Not on file    Mr. Fehringer's family history includes Colon cancer in his father; Diabetes in his brother, brother, father, maternal grandmother, mother, sister, sister, and sister; Hepatitis in his brother; Hyperlipidemia in his father; Lung cancer in his mother; Schizophrenia in his brother.      Objective:    Vitals:   08/26/20 1316  BP: 110/70  Pulse: 88    Physical Exam Well-developed well-nourished  AA male  in no acute distress.  Height, Weight, BMI 38.5  HEENT; nontraumatic normocephalic, EOMI, PE R LA, sclera anicteric. Oropharynx;not done Neck; supple, no JVD Cardiovascular; regular rate and rhythm with S1-S2, no murmur rub or gallop Pulmonary; Clear bilaterally Abdomen; soft, protuberant, no definite fluid wave no focal tenderness, nondistended, no palpable mass or hepatosplenomegaly, bowel sounds are active Rectal;not done today Skin; benign exam, no jaundice rash or appreciable lesions Extremities; no clubbing cyanosis or edema skin warm and dry Neuro/Psych; alert and oriented x4, grossly nonfocal  mood and affect appropriate       Assessment & Plan:   #71 70 year old African-American male with history of hep C induced Cirrhosis-recently established with [facility] hepatology, and referred for EGD for variceal screening.  Previous EGD 2016 [facility] with reported varices. Cirrhosis appears compensated by most recent labs, Screening abdominal ultrasound pending for [place] surveillance   #2  2 to 52-monthhistory of fairly generalized abdominal pain described as burning in nature Etiology not clear  #3 history of adenomatous colon polyps last colonoscopy 2015 [facility] with removal of adenomatous polyp and recommended for 5-year interval follow-up #4 adult onset diabetes mellitus #5 anal pruritus  Plan; patient will be scheduled for EGD and colonoscopy with Dr. CBryan Lemma  Both procedures were discussed in detail with patient including indications risks and benefits and he is agreeable to proceed. Patient has completed COVID-19 vaccination. [facility]  We have requested copy of that report.  He may need further  abdominal imaging depending on findings. Check pro time/INR and AFP today Further recommendations pending findings of above.  Plan  Titilayo Hagans Genia Harold PA-C 08/26/2020   Cc: Brunetta Jeans, PA-C

## 2020-08-27 LAB — AFP TUMOR MARKER: AFP-Tumor Marker: 4.4 ng/mL (ref <6.1)

## 2020-08-30 HISTORY — PX: UPPER GASTROINTESTINAL ENDOSCOPY: SHX188

## 2020-09-04 ENCOUNTER — Ambulatory Visit: Payer: 59 | Admitting: Physician Assistant

## 2020-09-04 NOTE — Progress Notes (Signed)
Agree with the assessment and plan as outlined by Amy Esterwood, PA-C.  Clemma Johnsen, DO, FACG  

## 2020-09-09 ENCOUNTER — Ambulatory Visit (AMBULATORY_SURGERY_CENTER): Payer: 59 | Admitting: Gastroenterology

## 2020-09-09 ENCOUNTER — Other Ambulatory Visit: Payer: Self-pay

## 2020-09-09 ENCOUNTER — Encounter: Payer: Self-pay | Admitting: Gastroenterology

## 2020-09-09 ENCOUNTER — Emergency Department (HOSPITAL_COMMUNITY)
Admission: EM | Admit: 2020-09-09 | Discharge: 2020-09-09 | Disposition: A | Discharge status: Home / Self Care | Payer: 59 | Attending: Emergency Medicine | Admitting: Emergency Medicine

## 2020-09-09 ENCOUNTER — Encounter (HOSPITAL_COMMUNITY): Payer: Self-pay | Admitting: Emergency Medicine

## 2020-09-09 VITALS — BP 87/42 | HR 77 | Temp 97.2°F | Resp 11 | Ht 65.0 in | Wt 231.0 lb

## 2020-09-09 DIAGNOSIS — Z79899 Other long term (current) drug therapy: Secondary | ICD-10-CM | POA: Diagnosis not present

## 2020-09-09 DIAGNOSIS — I4891 Unspecified atrial fibrillation: Secondary | ICD-10-CM | POA: Diagnosis present

## 2020-09-09 DIAGNOSIS — Z20822 Contact with and (suspected) exposure to covid-19: Secondary | ICD-10-CM | POA: Insufficient documentation

## 2020-09-09 DIAGNOSIS — I10 Essential (primary) hypertension: Secondary | ICD-10-CM | POA: Insufficient documentation

## 2020-09-09 DIAGNOSIS — F17210 Nicotine dependence, cigarettes, uncomplicated: Secondary | ICD-10-CM | POA: Insufficient documentation

## 2020-09-09 DIAGNOSIS — R1084 Generalized abdominal pain: Secondary | ICD-10-CM

## 2020-09-09 DIAGNOSIS — K319 Disease of stomach and duodenum, unspecified: Secondary | ICD-10-CM | POA: Diagnosis not present

## 2020-09-09 DIAGNOSIS — K2970 Gastritis, unspecified, without bleeding: Secondary | ICD-10-CM | POA: Diagnosis not present

## 2020-09-09 DIAGNOSIS — E1140 Type 2 diabetes mellitus with diabetic neuropathy, unspecified: Secondary | ICD-10-CM | POA: Insufficient documentation

## 2020-09-09 DIAGNOSIS — D124 Benign neoplasm of descending colon: Secondary | ICD-10-CM

## 2020-09-09 DIAGNOSIS — K5730 Diverticulosis of large intestine without perforation or abscess without bleeding: Secondary | ICD-10-CM

## 2020-09-09 DIAGNOSIS — K7460 Unspecified cirrhosis of liver: Secondary | ICD-10-CM | POA: Diagnosis not present

## 2020-09-09 DIAGNOSIS — Z86010 Personal history of colonic polyps: Secondary | ICD-10-CM | POA: Diagnosis not present

## 2020-09-09 DIAGNOSIS — K3189 Other diseases of stomach and duodenum: Secondary | ICD-10-CM

## 2020-09-09 DIAGNOSIS — I480 Paroxysmal atrial fibrillation: Secondary | ICD-10-CM | POA: Diagnosis not present

## 2020-09-09 DIAGNOSIS — K2990 Gastroduodenitis, unspecified, without bleeding: Secondary | ICD-10-CM

## 2020-09-09 DIAGNOSIS — K641 Second degree hemorrhoids: Secondary | ICD-10-CM

## 2020-09-09 DIAGNOSIS — D128 Benign neoplasm of rectum: Secondary | ICD-10-CM | POA: Diagnosis not present

## 2020-09-09 DIAGNOSIS — D125 Benign neoplasm of sigmoid colon: Secondary | ICD-10-CM

## 2020-09-09 DIAGNOSIS — Z96653 Presence of artificial knee joint, bilateral: Secondary | ICD-10-CM | POA: Insufficient documentation

## 2020-09-09 HISTORY — PX: COLONOSCOPY: SHX174

## 2020-09-09 LAB — CBC WITH DIFFERENTIAL/PLATELET
Abs Immature Granulocytes: 0.02 10*3/uL (ref 0.00–0.07)
Basophils Absolute: 0 10*3/uL (ref 0.0–0.1)
Basophils Relative: 1 %
Eosinophils Absolute: 0.2 10*3/uL (ref 0.0–0.5)
Eosinophils Relative: 3 %
HCT: 42.3 % (ref 39.0–52.0)
Hemoglobin: 14.2 g/dL (ref 13.0–17.0)
Immature Granulocytes: 0 %
Lymphocytes Relative: 20 %
Lymphs Abs: 1.1 10*3/uL (ref 0.7–4.0)
MCH: 30 pg (ref 26.0–34.0)
MCHC: 33.6 g/dL (ref 30.0–36.0)
MCV: 89.4 fL (ref 80.0–100.0)
Monocytes Absolute: 0.4 10*3/uL (ref 0.1–1.0)
Monocytes Relative: 7 %
Neutro Abs: 3.9 10*3/uL (ref 1.7–7.7)
Neutrophils Relative %: 69 %
Platelets: 232 10*3/uL (ref 150–400)
RBC: 4.73 MIL/uL (ref 4.22–5.81)
RDW: 16.5 % — ABNORMAL HIGH (ref 11.5–15.5)
WBC: 5.6 10*3/uL (ref 4.0–10.5)
nRBC: 0 % (ref 0.0–0.2)

## 2020-09-09 LAB — COMPREHENSIVE METABOLIC PANEL
ALT: 15 U/L (ref 0–44)
AST: 24 U/L (ref 15–41)
Albumin: 3.5 g/dL (ref 3.5–5.0)
Alkaline Phosphatase: 61 U/L (ref 38–126)
Anion gap: 9 (ref 5–15)
BUN: <5 mg/dL — ABNORMAL LOW (ref 8–23)
CO2: 24 mmol/L (ref 22–32)
Calcium: 9.3 mg/dL (ref 8.9–10.3)
Chloride: 111 mmol/L (ref 98–111)
Creatinine, Ser: 0.75 mg/dL (ref 0.61–1.24)
GFR, Estimated: >60 mL/min (ref >60)
Glucose, Bld: 124 mg/dL — ABNORMAL HIGH (ref 70–99)
Potassium: 3.7 mmol/L (ref 3.5–5.1)
Sodium: 144 mmol/L (ref 135–145)
Total Bilirubin: 1.3 mg/dL — ABNORMAL HIGH (ref 0.3–1.2)
Total Protein: 6.6 g/dL (ref 6.5–8.1)

## 2020-09-09 LAB — PROTIME-INR
INR: 1.2 (ref 0.8–1.2)
Prothrombin Time: 14.6 seconds (ref 11.4–15.2)

## 2020-09-09 LAB — ETHANOL: Alcohol, Ethyl (B): <10 mg/dL (ref <10)

## 2020-09-09 LAB — MAGNESIUM: Magnesium: 2 mg/dL (ref 1.7–2.4)

## 2020-09-09 LAB — RESP PANEL BY RT-PCR (FLU A&B, COVID) ARPGX2
Influenza A by PCR: NEGATIVE
Influenza B by PCR: NEGATIVE
SARS Coronavirus 2 by RT PCR: NEGATIVE

## 2020-09-09 LAB — FIBRINOGEN: Fibrinogen: 193 mg/dL — ABNORMAL LOW (ref 210–475)

## 2020-09-09 MED ORDER — SODIUM CHLORIDE 0.9 % IV SOLN: 500.0000 mL | Freq: Once | INTRAVENOUS | Status: DC

## 2020-09-09 NOTE — ED Provider Notes (Signed)
[facility] EMERG[facility]Y DEPARTMENT Provider Note   CSN: [id number] Arrival date & time: 09/09/20  1151     History Chief Complaint  Patient presents with  . Atrial Fibrillation    Bradley Hull is a 70 y.o. male.  HPI 70 year old male with a history of DM type II, potassium, hypertension, BPH, cirrhosis, alcohol abuse, nicotine addiction, obesity presents to the ER with new onset atrial fibrillation.  Patient was undergoing a colonoscopy when he became bradycardic, hypotensive and was noted to be in new onset atrial fibrillation.  When EMS arrived, his blood pressure was in the 82U systolic.  He was given 1200 mL normal saline and BP increased to 123/66.  Patient is alert and oriented, states he has no history of this.  Denies any chest pain, shortness of breath, dizziness, or any other symptoms.  States he has been taking colonoscopy prep over the last several days.  He is not anticoagulated.    Past Medical History:  Diagnosis Date  . Arthritis   . Benign prostatic hyperplasia   . Cirrhosis ([place])   . Colon polyps   . Diabetes mellitus without complication ([place])   . Hepatitis C   . History of kidney stones   . Hypertension   . Nicotine addiction   . Obesity   . Osteoarthritis of both knees   . Renal lithiasis     Patient Active Problem List   Diagnosis Date Noted  . Bilateral chronic knee pain 03/27/2020  . Status post bilateral knee replacements 03/27/2020  . History of hepatitis C 03/27/2020  . Other cirrhosis of liver ([place]) 03/27/2020  . Type 2 diabetes mellitus, uncontrolled, with neuropathy ([place]) 03/27/2020  . Hyperlipidemia associated with type 2 diabetes mellitus ([place]) 03/27/2020  . Benign prostatic hyperplasia with lower urinary tract symptoms 03/27/2020  . Vesicular rash 03/27/2020  . Full code status 03/27/2020  . Alcohol abuse 12/02/2013  . Obesity 12/02/2013  . Chronic hepatitis C ([place]) 11/14/2013    Past Surgical History:  Procedure  Laterality Date  . COLONOSCOPY    . ESOPHAGOGASTRODUODENOSCOPY    . KIDNEY STONE SURGERY Right   . LITHOTRIPSY Left   . MULTIPLE EXTRACTIONS WITH ALVEOLOPLASTY N/A 09/09/2016   Procedure: MULTIPLE EXTRACTION WITH ALVEOLOPLASTY - one, two, six, seven, nine, ten, eleven, sixteen, 34, twenty, twenty-one, twenty-two, twenty-four, twenty-five, twenty-six, twenty-seven, twenty-eight, twenty-nine, thirty-one, thirty-two; alveolplasty; removal bilateral mandible turi; removal cyst right mandible;  bone graft;  Surgeon: [name], DDS;  Location: [facility];  Service: Oral Su  . PILONIDAL CYST EXCISION     from spine  . TOTAL KNEE ARTHROPLASTY Bilateral 03/2020  . UPPER GASTROINTESTINAL ENDOSCOPY    . WISDOM TOOTH EXTRACTION     x4 removed       Family History  Problem Relation Age of Onset  . Lung cancer Mother        mets  . Diabetes Mother   . Diabetes Father   . Hyperlipidemia Father   . Colon cancer Father   . Diabetes Sister   . Hepatitis Brother   . Schizophrenia Brother   . Diabetes Sister   . Diabetes Sister   . Diabetes Brother   . Diabetes Brother   . Diabetes Maternal Grandmother   . Esophageal cancer Neg Hx   . Rectal cancer Neg Hx   . Stomach cancer Neg Hx     Social History   Tobacco Use  . Smoking status: Current Every Day Smoker  Packs/day: 0.25    Types: Cigarettes  . Smokeless tobacco: Never Used  Vaping Use  . Vaping Use: Never used  Substance Use Topics  . Alcohol use: Yes    Comment: 3 per day  . Drug use: No    Home Medications Prior to Admission medications   Medication Sig Start Date End Date Taking? Authorizing Provider  amLODipine (NORVASC) 5 MG tablet Take 1 tablet (5 mg total) by mouth daily. 07/03/20   Brunetta Jeans, PA-C  amoxicillin-clavulanate (AUGMENTIN) 875-125 MG tablet Take 875 tablets by mouth 2 (two) times daily. 08/27/20 09/17/20  [provider]  amoxicillin-clavulanate (AUGMENTIN) 875-125 MG tablet Take 1  tablet by mouth 2 (two) times daily. Patient not taking: Reported on 09/09/2020 08/27/20   [provider]  carvedilol (COREG) 6.25 MG tablet Take 1 tablet (6.25 mg total) by mouth 2 (two) times daily with a meal. 04/08/20   Ngetich, Dinah C, NP  hydrochlorothiazide (HYDRODIURIL) 25 MG tablet Take 1 tablet (25 mg total) by mouth daily. 04/08/20   Ngetich, Dinah C, NP  HYDROcodone-acetaminophen (NORCO/VICODIN) 5-325 MG tablet Take 3-5 tablets by mouth as needed for moderate pain.    [provider]  levofloxacin (LEVAQUIN) 750 MG tablet Take 750 mg by mouth every morning. Patient not taking: Reported on 09/09/2020 08/25/20   [provider]  lidocaine (LIDODERM) 5 %  07/07/20   [provider]  losartan (COZAAR) 25 MG tablet Take 1 tablet (25 mg total) by mouth daily. 07/07/20   Brunetta Jeans, PA-C  sitaGLIPtin-metformin (JANUMET) 50-1000 MG tablet Take 1 tablet by mouth daily. 1 tablet by mouth daily 04/08/20   Ngetich, Dinah C, NP  tamsulosin (FLOMAX) 0.4 MG CAPS capsule Take 1 capsule (0.4 mg total) by mouth daily. 04/08/20   Ngetich, Nelda [facility], NP    Allergies    Dilaudid [hydromorphone hcl]  Review of Systems   Review of Systems  Constitutional: Negative for chills and fever.  HENT: Negative for ear pain and sore throat.   Eyes: Negative for pain and visual disturbance.  Respiratory: Negative for cough and shortness of breath.   Cardiovascular: Negative for chest pain and palpitations.  Gastrointestinal: Negative for abdominal pain and vomiting.  Genitourinary: Negative for dysuria and hematuria.  Musculoskeletal: Negative for arthralgias and back pain.  Skin: Negative for color change and rash.  Neurological: Negative for seizures and syncope.  All other systems reviewed and are negative.   Physical Exam Updated Vital Signs BP 124/82   Pulse 77   Temp 98.2 F (36.8 C)   Resp 19   Ht 5\' 5"  (1.651 m)   Wt 104.8 kg   SpO2 97%   BMI 38.44 kg/m    Physical Exam Vitals and nursing note reviewed.  Constitutional:      Appearance: He is well-developed and well-nourished.  HENT:     Head: Normocephalic and atraumatic.  Eyes:     Conjunctiva/sclera: Conjunctivae normal.  Cardiovascular:     Rate and Rhythm: Normal rate. Rhythm irregular.     Heart sounds: No murmur heard.   Pulmonary:     Effort: Pulmonary effort is normal. No respiratory distress.     Breath sounds: Normal breath sounds.  Abdominal:     Palpations: Abdomen is soft.     Tenderness: There is no abdominal tenderness.  Musculoskeletal:        General: No edema.     Cervical back: Neck supple.  Skin:    General: Skin  is warm and dry.  Neurological:     Mental Status: He is alert.  Psychiatric:        Mood and Affect: Mood and affect normal.     ED Results / Procedures / Treatments   Labs (all labs ordered are listed, but only abnormal results are displayed) Labs Reviewed  CBC WITH DIFFERENTIAL/PLATELET - Abnormal; Notable for the following components:      Result Value   RDW 16.5 (*)    All other components within normal limits  COMPREHENSIVE METABOLIC PANEL - Abnormal; Notable for the following components:   Glucose, Bld 124 (*)    BUN <5 (*)    Total Bilirubin 1.3 (*)    All other components within normal limits  FIBRINOGEN - Abnormal; Notable for the following components:   Fibrinogen 193 (*)    All other components within normal limits  RESP PANEL BY RT-PCR (FLU A&B, COVID) ARPGX2  PROTIME-INR  MAGNESIUM  ETHANOL    EKG EKG Interpretation  Date/Time:  Wednesday September 09 2020 13:58:15 EST Ventricular Rate:  71 PR Interval:    QRS Duration: 87 QT Interval:  369 QTC Calculation: 401 R Axis:   -67 Text Interpretation: Sinus rhythm Prolonged PR interval Left anterior fascicular block Abnormal R-wave progression, late transition Confirmed by Dene Gentry [id number]) on 09/09/2020 2:01:54 PM   Radiology No results  found.  Procedures Procedures   Medications Ordered in ED Medications - No data to display  ED Course  I have reviewed the triage vital signs and the nursing notes.  Pertinent labs & imaging results that were available during my care of the patient were reviewed by me and considered in my medical decision making (see chart for details).    MDM Rules/Calculators/A&P                          70 year old male who presents to the ER with new onset atrial fibrillation.  Vitals on arrival stable, blood pressure 131/70, and atrial fibrillation with a pulse ranging from 80-1 15.  He is not hypoxic and not requiring supplemental O2.  He has no complaints at this time.  States he feels great.  Physical exam with soft and nontender abdomen.     Lab work overall reassuring, CBC without any acute abnormalities, CMP with no electrolyte abnormalities, normal liver function test and creatinine.  Normal PT/INR, Covid test negative.  Magnesium normal.  Repeat EKG after about an hour here in the ER with normal sinus rhythm.  No evidence of tachycardia.  Vitals and remained stable.  CHA2DS2-VASc of 2.  Discussed with Dr. Alvester Chou with cardiology, who feels as though the patient does not need to be anticoagulated and can follow-up with his PCP.  Reassuring work-up and findings discussed with the patient.  He was instructed to follow-up with his PCP, with strict return precautions.  He voiced understanding and is agreeable.  At this stage in the ED course, the patient is medically screened and stable for discharge.   This was a shared visit with my supervising physician Dr. Francia Greaves who independently saw and evaluated the patient & provided guidance in evaluation/management/disposition ,in agreement with care  Final Clinical Impression(s) / ED Diagnoses Final diagnoses:  Paroxysmal atrial fibrillation [facility])    Rx / DC Orders ED Discharge Orders    None       Lyndel Safe 09/09/20 1510     Valarie Merino, MD 09/11/20 [phone]

## 2020-09-09 NOTE — Op Note (Addendum)
[facility] Patient Name: Bradley Hull Procedure Date: 09/09/2020 9:55 AM MRN: [id number] Endoscopist: Gerrit Heck , MD Age: 70 Referring MD:  Date of Birth: 11-24-1950 Gender: Male Account #: [id number] Procedure:                Colonoscopy Indications:              Surveillance: Personal history of adenomatous                            polyps on last colonoscopy > 5 years ago Medicines:                Monitored Anesthesia Care Procedure:                Pre-Anesthesia Assessment:                           - Prior to the procedure, a History and Physical                            was performed, and patient medications and                            allergies were reviewed. The patient's tolerance of                            previous anesthesia was also reviewed. The risks                            and benefits of the procedure and the sedation                            options and risks were discussed with the patient.                            All questions were answered, and informed consent                            was obtained. Prior Anticoagulants: The patient has                            taken no previous anticoagulant or antiplatelet                            agents. ASA Grade Assessment: III - A patient with                            severe systemic disease. After reviewing the risks                            and benefits, the patient was deemed in                            satisfactory condition to undergo the procedure.  After obtaining informed consent, the colonoscope                            was passed under direct vision. Throughout the                            procedure, the patient's blood pressure, pulse, and                            oxygen saturations were monitored continuously. The                            Olympus CF-HQ190L [id number]) Colonoscope was                            introduced through the  anus and advanced to the the                            terminal ileum. The colonoscopy was performed                            without difficulty. The patient tolerated the                            procedure fairly well. Towards the end of the case,                            the patient developed bradycardia into the 30's.                            Maintained normal perfusing blood pressure and                            respiratory rate and oxygen saturation. This was                            treated with Robinul with return to normal heart                            rate. However, after completion of the procedure,                            during recovery patient developed new onset Atrial                            Fibrillation with otherwise stable BP. Case                            discussed with patient and family at bedside, with                            plan for transport to [facility] for  expedited evaluation of new onset AFib. The quality                            of the bowel preparation was good. The terminal                            ileum, ileocecal valve, appendiceal orifice, and                            rectum were photographed. Scope In: 10:11:20 AM Scope Out: 10:31:35 AM Scope Withdrawal Time: 0 hours 16 minutes 36 seconds  Total Procedure Duration: 0 hours 20 minutes 15 seconds  Findings:                 The perianal and digital rectal examinations were                            normal.                           Six sessile polyps were found in the rectum (2),                            sigmoid colon (3), and descending colon. The polyps                            were 2 to 5 mm in size. These polyps were removed                            with a cold snare. Resection and retrieval were                            complete. Estimated blood loss was minimal.                           A few small-mouthed diverticula  were found in the                            sigmoid colon.                           Non-bleeding internal hemorrhoids were found during                            retroflexion. The hemorrhoids were small and Grade                            II (internal hemorrhoids that prolapse but reduce                            spontaneously).                           The terminal ileum appeared normal. Complications:            No immediate complications.  Estimated Blood Loss:     Estimated blood loss was minimal. Impression:               - Six 2 to 5 mm polyps in the rectum, in the                            sigmoid colon and in the descending colon, removed                            with a cold snare. Resected and retrieved.                           - Diverticulosis in the sigmoid colon.                           - Non-bleeding internal hemorrhoids.                           - The examined portion of the ileum was normal. Recommendation:           - Patient has a contact number available for                            emergencies.                           - After completion of the procedure, during                            recovery patient developed new onset Atrial                            Fibrillation with otherwise stable BP. Case                            discussed with patient and family at bedside, with                            plan for transport to [facility] for                            expedited evaluation of new onset AFib.                           - Resume previous diet after ER evaluation as above.                           - Continue present medications.                           - Await pathology results.                           - Repeat colonoscopy for surveillance based on  pathology results.                           - Use fiber, for example Citrucel, Fibercon, Konsyl                            or Metamucil. Gerrit Heck,  MD 09/09/2020 10:49:17 AM

## 2020-09-09 NOTE — Progress Notes (Signed)
Pt brought to recovery with new onset AFib. 12 lead EKG performed at bedside. CRNA and MD at bedside. Pt being transmitted to [facility] hospital via EMS.

## 2020-09-09 NOTE — Progress Notes (Signed)
AR - Check-in  CW - VS  Per pt IV needs to go on the left side d/t hx IV drug use and rt side is not good place.  maw

## 2020-09-09 NOTE — ED Notes (Signed)
Pt walked out without discharge papers, stating his wife is rushing him. Pt seen walking out

## 2020-09-09 NOTE — Progress Notes (Signed)
EKG performed and reviewed and notable for atrial fibrillation.  Discussed with patient and his family member at bedside.  No known prior history of atrial fibrillation or other dysrhythmia.  No history of palpitations and does not feel any palpitations currently.  Aside from waking up from sedation, otherwise feels okay.  BP a little soft with systolic in the 38V, but heart rate stable in the 90s now.  Patient otherwise well conversive.  Given new onset atrial fibrillation, plan for EMS transport to [facility] ER for expedited evaluation.  I discussed this plan with the patient and his family member and they agree.

## 2020-09-09 NOTE — ED Triage Notes (Signed)
Pt from [facility] for colonoscopy. During procedure pt went into A-fib. BP 98V systolic on EMS arrival. 0254CY NS came up to 123/66. Pt A/O x4

## 2020-09-09 NOTE — Progress Notes (Signed)
Pt ht rate dropped to 32 treated with robinul 0.2. B/P stable. Will continue to watch.tb

## 2020-09-09 NOTE — Op Note (Signed)
[facility] Patient Name: Kashaun Bebo Procedure Date: 09/09/2020 9:55 AM MRN: [id number] Endoscopist: Gerrit Heck , MD Age: 70 Referring MD:  Date of Birth: 1950/09/27 Gender: Male Account #: [id number] Procedure:                Upper GI endoscopy Indications:              Generalized abdominal pain, Cirrhosis rule out                            esophageal varices                           History of well-compensated cirrhosis with MELD 7,                            CTP A Medicines:                Monitored Anesthesia Care Procedure:                Pre-Anesthesia Assessment:                           - Prior to the procedure, a History and Physical                            was performed, and patient medications and                            allergies were reviewed. The patient's tolerance of                            previous anesthesia was also reviewed. The risks                            and benefits of the procedure and the sedation                            options and risks were discussed with the patient.                            All questions were answered, and informed consent                            was obtained. Prior Anticoagulants: The patient has                            taken no previous anticoagulant or antiplatelet                            agents. ASA Grade Assessment: III - A patient with                            severe systemic disease. After reviewing the risks  and benefits, the patient was deemed in                            satisfactory condition to undergo the procedure.                           After obtaining informed consent, the endoscope was                            passed under direct vision. Throughout the                            procedure, the patient's blood pressure, pulse, and                            oxygen saturations were monitored continuously. The                             Endoscope was introduced through the mouth, and                            advanced to the second part of duodenum. The upper                            GI endoscopy was accomplished without difficulty.                            The patient tolerated the procedure well. Scope In: Scope Out: Findings:                 The examined esophagus was normal. No esophageal                            varices noted on this study.                           The Z-line was regular and was found 40 cm from the                            incisors.                           Diffuse moderate inflammation characterized by                            congestion (edema) and erythema was found in the                            entire examined stomach. Biopsies were taken with a                            cold forceps for Helicobacter pylori testing.                            Estimated blood loss was  minimal.                           The examined duodenum was normal. Complications:            No immediate complications. Estimated Blood Loss:     Estimated blood loss was minimal. Impression:               - Normal esophagus.                           - Z-line regular, 40 cm from the incisors.                           - Gastritis. Biopsied.                           - Normal examined duodenum. Recommendation:           - Await pathology results.                           - Patient has a contact number available for                            emergencies. The signs and symptoms of potential                            delayed complications were discussed with the                            patient. Return to normal activities tomorrow.                            Written discharge instructions were provided to the                            patient.                           - Resume previous diet.                           - Continue present medications.                           - Use Prilosec (omeprazole)  40 mg PO BID for 6                            weeks to promote mucosal healing, then reduce to 40                            mg daily and continue to titrate to lowest                            effective dose if abdominal symptoms resolved.                           -  Follow-up in [facility] as                            scheduled.                           - Repeat EGD in 2-3 years for ongoing esophageal                            varices screening.                           - Colonoscopy today. Gerrit Heck, MD 09/09/2020 10:41:45 AM

## 2020-09-09 NOTE — Progress Notes (Signed)
Called to room to assist during endoscopic procedure.  Patient ID and intended procedure confirmed with present staff. Received instructions for my participation in the procedure from the performing physician.  

## 2020-09-09 NOTE — Discharge Instructions (Signed)
You were evaluated in the Emergency Department and after careful evaluation, we did not find any emergent condition requiring admission or further testing in the hospital. ° °Please return to the Emergency Department if you experience any worsening of your condition.  We encourage you to follow up with a primary care provider.  Thank you for allowing us to be a part of your care. °

## 2020-09-09 NOTE — Progress Notes (Signed)
Pt went in to Afib in last few minutes of case after HR dropped to 32. HR 70-90's but B/P did drop to 80/50 in PACU. Currently 100/69 12 lead confirms A-fib, pt denies being able to feel the abnormal beats. Spoke with Dr Bryan Lemma and suggest pt goes to ER for evalution of new onset A-fib. tb

## 2020-09-10 ENCOUNTER — Other Ambulatory Visit: Payer: Self-pay | Admitting: Gastroenterology

## 2020-09-10 ENCOUNTER — Telehealth: Payer: Self-pay | Admitting: Gastroenterology

## 2020-09-10 MED ORDER — OMEPRAZOLE 40 MG PO CPDR: DELAYED_RELEASE_CAPSULE | ORAL | 1 refills | Status: DC

## 2020-09-10 NOTE — Telephone Encounter (Signed)
Medication has been sent to pharmacy. They will contact patient once ready for pick up.

## 2020-09-10 NOTE — Telephone Encounter (Signed)
Patient called stated he was told he would get medication to help with his stomach is requesting for it to be sent to pharmacy

## 2020-09-10 NOTE — Telephone Encounter (Signed)
Spoke to patient who was requesting his Prilosec prescription be sent to [facility] in [place]. Previous prescription cancelled and new one sent to [facility]

## 2020-09-14 ENCOUNTER — Encounter: Payer: Self-pay | Admitting: Gastroenterology

## 2020-09-14 ENCOUNTER — Telehealth: Payer: Self-pay | Admitting: General Practice

## 2020-09-14 DIAGNOSIS — E1140 Type 2 diabetes mellitus with diabetic neuropathy, unspecified: Secondary | ICD-10-CM

## 2020-09-14 DIAGNOSIS — IMO0002 Reserved for concepts with insufficient information to code with codable children: Secondary | ICD-10-CM

## 2020-09-14 MED ORDER — SITAGLIPTIN PHOS-METFORMIN HCL 50-1000 MG PO TABS: 1.0000 | ORAL_TABLET | Freq: Every day | ORAL | 0 refills | Status: DC

## 2020-09-14 NOTE — Telephone Encounter (Signed)
Rx sent to the preferred patient pharmacy  

## 2020-09-14 NOTE — Telephone Encounter (Signed)
Pt called in asking for a refill on the Janumet, pt states that he is out of this medication and his TOC appt isn't until 09/29/20 with Dr. Ronnald Ramp.   Pt would like this sent to [facility] at [place]   Phone # [phone]  Pt is out of town

## 2020-09-25 ENCOUNTER — Ambulatory Visit
Admission: RE | Admit: 2020-09-25 | Discharge: 2020-09-25 | Disposition: A | Discharge status: Home / Self Care | Payer: 59 | Source: Ambulatory Visit | Attending: Nurse Practitioner | Admitting: Nurse Practitioner

## 2020-09-25 DIAGNOSIS — K7469 Other cirrhosis of liver: Secondary | ICD-10-CM

## 2020-09-29 ENCOUNTER — Other Ambulatory Visit: Payer: Self-pay

## 2020-09-29 ENCOUNTER — Encounter: Payer: Self-pay | Admitting: Internal Medicine

## 2020-09-29 ENCOUNTER — Ambulatory Visit (INDEPENDENT_AMBULATORY_CARE_PROVIDER_SITE_OTHER): Payer: 59 | Admitting: Internal Medicine

## 2020-09-29 VITALS — BP 160/80 | HR 74 | Temp 98.8°F | Resp 16 | Ht 65.0 in | Wt 231.0 lb

## 2020-09-29 DIAGNOSIS — Z0001 Encounter for general adult medical examination with abnormal findings: Secondary | ICD-10-CM

## 2020-09-29 DIAGNOSIS — R9431 Abnormal electrocardiogram [ECG] [EKG]: Secondary | ICD-10-CM | POA: Diagnosis not present

## 2020-09-29 DIAGNOSIS — I10 Essential (primary) hypertension: Secondary | ICD-10-CM | POA: Diagnosis not present

## 2020-09-29 DIAGNOSIS — E1129 Type 2 diabetes mellitus with other diabetic kidney complication: Secondary | ICD-10-CM | POA: Diagnosis not present

## 2020-09-29 DIAGNOSIS — R809 Proteinuria, unspecified: Secondary | ICD-10-CM

## 2020-09-29 DIAGNOSIS — N6310 Unspecified lump in the right breast, unspecified quadrant: Secondary | ICD-10-CM | POA: Insufficient documentation

## 2020-09-29 DIAGNOSIS — E1140 Type 2 diabetes mellitus with diabetic neuropathy, unspecified: Secondary | ICD-10-CM | POA: Diagnosis not present

## 2020-09-29 DIAGNOSIS — E782 Mixed hyperlipidemia: Secondary | ICD-10-CM | POA: Insufficient documentation

## 2020-09-29 DIAGNOSIS — E1165 Type 2 diabetes mellitus with hyperglycemia: Secondary | ICD-10-CM

## 2020-09-29 DIAGNOSIS — E785 Hyperlipidemia, unspecified: Secondary | ICD-10-CM | POA: Insufficient documentation

## 2020-09-29 DIAGNOSIS — N401 Enlarged prostate with lower urinary tract symptoms: Secondary | ICD-10-CM

## 2020-09-29 DIAGNOSIS — IMO0002 Reserved for concepts with insufficient information to code with codable children: Secondary | ICD-10-CM

## 2020-09-29 LAB — POCT GLYCOSYLATED HEMOGLOBIN (HGB A1C): Hemoglobin A1C: 6 % — AB (ref 4.0–5.6)

## 2020-09-29 MED ORDER — INDAPAMIDE 1.25 MG PO TABS: 1.2500 mg | ORAL_TABLET | Freq: Every day | ORAL | 0 refills | Status: DC

## 2020-09-29 MED ORDER — METFORMIN HCL ER 500 MG PO TB24: 500.0000 mg | ORAL_TABLET | Freq: Every day | ORAL | 1 refills | Status: DC

## 2020-09-29 MED ORDER — AMLODIPINE BESYLATE 10 MG PO TABS: 10.0000 mg | ORAL_TABLET | Freq: Every day | ORAL | 1 refills | Status: DC

## 2020-09-29 MED ORDER — CARVEDILOL 6.25 MG PO TABS: 6.2500 mg | ORAL_TABLET | Freq: Two times a day (BID) | ORAL | 1 refills | Status: DC

## 2020-09-29 MED ORDER — TAMSULOSIN HCL 0.4 MG PO CAPS: 0.4000 mg | ORAL_CAPSULE | Freq: Every day | ORAL | 1 refills | Status: DC

## 2020-09-29 MED ORDER — ROSUVASTATIN CALCIUM 10 MG PO TABS: 10.0000 mg | ORAL_TABLET | Freq: Every day | ORAL | 1 refills | Status: DC

## 2020-09-29 MED ORDER — LOSARTAN POTASSIUM 25 MG PO TABS: 25.0000 mg | ORAL_TABLET | Freq: Every day | ORAL | 1 refills | Status: DC

## 2020-09-29 NOTE — Progress Notes (Unsigned)
Subjective:  Patient ID: Bradley Hull, male    DOB: Jul 24, 1951  Age: 70 y.o. MRN: [id number]  CC: Annual Exam, Hyperlipidemia, Hypertension, and Diabetes  This visit occurred during the SARS-CoV-2 public health emergency.  Safety protocols were in place, including screening questions prior to the visit, additional usage of staff PPE, and extensive cleaning of exam room while observing appropriate contact time as indicated for disinfecting solutions.    HPI JOURNEY RATTERMAN presents for a CPX.  He complains of a 1 month hx of mass in his right breast.  He asks that I refer him to a cardiologist. He recently underwent a GI procedure and was told that his EKG was abnormal. He has fatigue with exertion but denies CP/SOB/palpitations/or diaphoresis.  He recently saw a urologist and will undergo a cystoscopy tomorrow.  Outpatient Medications Prior to Visit  Medication Sig Dispense Refill  . lidocaine (LIDODERM) 5 %     . omeprazole (PRILOSEC) 40 MG capsule Use Prilosec (omeprazole) 40 mg PO BID for 6 weeks to promote mucosal healing, then reduce to 40 mg daily and continue to titrate to lowest effective dose if abdominal symptoms resolved. 180 capsule 1  . omeprazole (PRILOSEC) 40 MG capsule Use Prilosec (omeprazole) 40 mg PO BID for 6 weeks to promote mucosal healing, then  reduce to 40 mg daily and continue to titrate to lowest effective dose if abdominal  symptoms resolved 180 capsule 1  . amLODipine (NORVASC) 5 MG tablet Take 1 tablet (5 mg total) by mouth daily. 90 tablet 1  . carvedilol (COREG) 6.25 MG tablet Take 1 tablet (6.25 mg total) by mouth 2 (two) times daily with a meal. 60 tablet 0  . hydrochlorothiazide (HYDRODIURIL) 25 MG tablet Take 1 tablet (25 mg total) by mouth daily. 30 tablet 0  . HYDROcodone-acetaminophen (NORCO/VICODIN) 5-325 MG tablet Take 3-5 tablets by mouth as needed for moderate pain.    Marland Kitchen losartan (COZAAR) 25 MG tablet Take 1 tablet (25 mg total) by mouth  daily. 90 tablet 1  . sitaGLIPtin-metformin (JANUMET) 50-1000 MG tablet Take 1 tablet by mouth daily. 1 tablet by mouth daily 30 tablet 0  . tamsulosin (FLOMAX) 0.4 MG CAPS capsule Take 1 capsule (0.4 mg total) by mouth daily. 30 capsule 0  . 0.9 %  sodium chloride infusion      No facility-administered medications prior to visit.    ROS Review of Systems  Constitutional: Positive for fatigue. Negative for appetite change, chills, diaphoresis, fever and unexpected weight change.  HENT: Negative.   Eyes: Negative for visual disturbance.  Respiratory: Negative for cough, chest tightness, shortness of breath and wheezing.   Cardiovascular: Negative for chest pain, palpitations and leg swelling.  Gastrointestinal: Negative for abdominal pain, blood in stool, constipation, diarrhea, nausea and vomiting.  Genitourinary: Negative.  Negative for difficulty urinating, dysuria and hematuria.  Musculoskeletal: Negative.  Negative for arthralgias and myalgias.  Skin: Negative.  Negative for color change.  Neurological: Negative.  Negative for dizziness, weakness, light-headedness and headaches.  Hematological: Negative for adenopathy. Does not bruise/bleed easily.  Psychiatric/Behavioral: Negative.     Objective:  BP (!) 160/80   Pulse 74   Temp 98.8 F (37.1 C) (Oral)   Resp 16   Ht 5\' 5"  (1.651 m)   Wt 231 lb (104.8 kg)   SpO2 97%   BMI 38.44 kg/m   BP Readings from Last 3 Encounters:  09/29/20 (!) 160/80  09/09/20 124/82  09/09/20 (!) 87/42  Wt Readings from Last 3 Encounters:  09/29/20 231 lb (104.8 kg)  09/09/20 231 lb (104.8 kg)  09/09/20 231 lb (104.8 kg)    Physical Exam Vitals reviewed.  HENT:     Nose: Nose normal.     Mouth/Throat:     Mouth: Mucous membranes are moist.  Eyes:     General: No scleral icterus.    Conjunctiva/sclera: Conjunctivae normal.  Cardiovascular:     Rate and Rhythm: Normal rate and regular rhythm.     Heart sounds: No murmur  heard.   Pulmonary:     Effort: Pulmonary effort is normal.     Breath sounds: No stridor. No wheezing, rhonchi or rales.  Chest:    Abdominal:     General: Abdomen is protuberant. Bowel sounds are normal. There is no distension.     Palpations: Abdomen is soft. There is no hepatomegaly, splenomegaly or mass.     Tenderness: There is no abdominal tenderness.     Hernia: No hernia is present.  Musculoskeletal:        General: Normal range of motion.     Cervical back: Neck supple.     Right lower leg: No edema.     Left lower leg: No edema.  Lymphadenopathy:     Cervical: No cervical adenopathy.  Skin:    General: Skin is warm and dry.     Coloration: Skin is not pale.  Neurological:     General: No focal deficit present.     Mental Status: He is alert and oriented to person, place, and time. Mental status is at baseline.  Psychiatric:        Mood and Affect: Mood normal.        Behavior: Behavior normal.     Lab Results  Component Value Date   WBC 5.6 09/09/2020   HGB 14.2 09/09/2020   HCT 42.3 09/09/2020   PLT 232 09/09/2020   GLUCOSE 124 (H) 09/09/2020   CHOL 131 06/03/2020   TRIG 89.0 06/03/2020   HDL 49.60 06/03/2020   LDLCALC 64 06/03/2020   ALT 15 09/09/2020   AST 24 09/09/2020   NA 144 09/09/2020   K 3.7 09/09/2020   CL 111 09/09/2020   CREATININE 0.75 09/09/2020   BUN <5 (L) 09/09/2020   CO2 24 09/09/2020   TSH 0.721 07/19/2018   PSA 1.66 03/01/2016   INR 1.2 09/09/2020   HGBA1C 6.0 (A) 09/29/2020   MICROALBUR 8.8 (H) 06/03/2020    US Abdomen Complete  Result Date: 09/25/2020 CLINICAL DATA:  Cirrhosis. EXAM: ABDOMEN ULTRASOUND COMPLETE COMPARISON:  CT abdomen pelvis 03/12/2018 FINDINGS: Gallbladder: No gallstones or wall thickening visualized. No sonographic Murphy sign noted by sonographer. Common bile duct: Diameter: 3 mm. Liver: No focal lesion identified. Nodular contour of the liver. Heterogeneous parenchymal echogenicity. Portal vein is patent  on color Doppler imaging with normal direction of blood flow towards the liver. IVC: Not visualized. Pancreas: Visualized portion unremarkable. Spleen: Size and appearance within normal limits. Right Kidney: Length: 14cm. Echogenicity within normal limits. Couple of cystic lesions measuring up to 2.5 cm likely represents a simple renal cyst. No mass or hydronephrosis visualized. Left Kidney: Length: 13cm. Echogenicity within normal limits. Cystic lesion measuring up to 3.7 cm within associated septation demonstrating vascular flow. Couple of calcified stones are noted within the left kidney measuring up to 0.9 cm. No mass or hydronephrosis visualized. Abdominal aorta: No aneurysm visualized. Other findings: None. IMPRESSION: 1. Suggestion of a likely complex 3.7  cm left renal cystic lesion. Recommend MRI renal protocol for further evaluation. 2. Cirrhosis with no findings of portal hypertension. Limited evaluation focal hepatic lesion. Consider MRI liver protocol for further evaluation. 3. Nonobstructive calcified left nephrolithiasis measuring up to 0.9 cm. These results will be called to the ordering clinician or representative by the Radiologist Assistant, and communication documented in the PACS or [employer]. Electronically Signed   By: Iven Finn M.D.   On: 09/25/2020 20:48    Assessment & Plan:   Laney was seen today for annual exam, hyperlipidemia, hypertension and diabetes.  Diagnoses and all orders for this visit:  Abnormal electrocardiogram (ECG) (EKG) -     Ambulatory referral to Cardiology  Essential hypertension- His blood pressure is not adequately well controlled.  I recommended that he upgrade to a more potent thiazide diuretic, increase the dose of the CCB, continue the current dose of carvedilol, and to improve his lifestyle modifications. -     amLODipine (NORVASC) 10 MG tablet; Take 1 tablet (10 mg total) by mouth daily. -     carvedilol (COREG) 6.25 MG tablet; Take 1  tablet (6.25 mg total) by mouth 2 (two) times daily with a meal. -     indapamide (LOZOL) 1.25 MG tablet; Take 1 tablet (1.25 mg total) by mouth daily.  Microalbuminuria due to type 2 diabetes mellitus (HCC) -     losartan (COZAAR) 25 MG tablet; Take 1 tablet (25 mg total) by mouth daily.  Type 2 diabetes mellitus, uncontrolled, with neuropathy ([place])- His A1c is down to 6.0%.  His blood sugar is overcontrolled.  I recommended that he stop taking the DPP 4 inhibitor and to reduce the dose of Metformin. -     Ambulatory referral to Ophthalmology -     POCT glycosylated hemoglobin (Hb A1C) -     HM Diabetes Foot Exam -     metFORMIN (GLUCOPHAGE-XR) 500 MG 24 hr tablet; Take 1 tablet (500 mg total) by mouth daily with breakfast.  Benign prostatic hyperplasia with lower urinary tract symptoms, symptom details unspecified -     tamsulosin (FLOMAX) 0.4 MG CAPS capsule; Take 1 capsule (0.4 mg total) by mouth daily.  Hyperlipidemia with target LDL less than 100 -     rosuvastatin (CRESTOR) 10 MG tablet; Take 1 tablet (10 mg total) by mouth daily.  Mass of right breast -     MM DIAG BREAST TOMO BILATERAL; Future   I have discontinued Elery P. Sendejo's hydrochlorothiazide, HYDROcodone-acetaminophen, amLODipine, and sitaGLIPtin-metformin. I am also having him start on rosuvastatin, amLODipine, indapamide, and metFORMIN. Additionally, I am having him maintain his lidocaine, omeprazole, omeprazole, carvedilol, tamsulosin, and losartan. We will stop administering sodium chloride.  Meds ordered this encounter  Medications  . rosuvastatin (CRESTOR) 10 MG tablet    Sig: Take 1 tablet (10 mg total) by mouth daily.    Dispense:  90 tablet    Refill:  1  . amLODipine (NORVASC) 10 MG tablet    Sig: Take 1 tablet (10 mg total) by mouth daily.    Dispense:  90 tablet    Refill:  1  . carvedilol (COREG) 6.25 MG tablet    Sig: Take 1 tablet (6.25 mg total) by mouth 2 (two) times daily with a meal.     Dispense:  180 tablet    Refill:  1  . tamsulosin (FLOMAX) 0.4 MG CAPS capsule    Sig: Take 1 capsule (0.4 mg total) by mouth daily.  Dispense:  90 capsule    Refill:  1  . losartan (COZAAR) 25 MG tablet    Sig: Take 1 tablet (25 mg total) by mouth daily.    Dispense:  90 tablet    Refill:  1  . indapamide (LOZOL) 1.25 MG tablet    Sig: Take 1 tablet (1.25 mg total) by mouth daily.    Dispense:  90 tablet    Refill:  0  . metFORMIN (GLUCOPHAGE-XR) 500 MG 24 hr tablet    Sig: Take 1 tablet (500 mg total) by mouth daily with breakfast.    Dispense:  90 tablet    Refill:  1     Follow-up: Return in about 3 months (around 12/30/2020).  Scarlette Calico, MD

## 2020-09-29 NOTE — Patient Instructions (Signed)

## 2020-09-30 ENCOUNTER — Encounter: Payer: Self-pay | Admitting: Internal Medicine

## 2020-09-30 DIAGNOSIS — Z0001 Encounter for general adult medical examination with abnormal findings: Secondary | ICD-10-CM | POA: Insufficient documentation

## 2020-09-30 NOTE — Assessment & Plan Note (Signed)
Exam completed Labs reviewed Vaccines reviewed Cancer screenings are up-to-date Patient education was given. 

## 2020-10-09 ENCOUNTER — Inpatient Hospital Stay: Payer: 59

## 2020-10-09 ENCOUNTER — Other Ambulatory Visit: Payer: Self-pay

## 2020-10-09 ENCOUNTER — Ambulatory Visit: Payer: 59 | Admitting: Cardiology

## 2020-10-09 ENCOUNTER — Encounter: Payer: Self-pay | Admitting: Cardiology

## 2020-10-09 VITALS — BP 143/87 | HR 79 | Ht 65.5 in | Wt 237.0 lb

## 2020-10-09 DIAGNOSIS — R9431 Abnormal electrocardiogram [ECG] [EKG]: Secondary | ICD-10-CM

## 2020-10-09 DIAGNOSIS — I10 Essential (primary) hypertension: Secondary | ICD-10-CM

## 2020-10-09 DIAGNOSIS — I499 Cardiac arrhythmia, unspecified: Secondary | ICD-10-CM

## 2020-10-09 NOTE — Progress Notes (Signed)
Patient referred by [name], MD for abnormal EKG  Subjective:   Bradley Hull, male    DOB: 08/14/1950, 70 y.o.   MRN: [id number]   Chief Complaint  Patient presents with  . Abnormal ECG  . Coronary Artery Disease     HPI  71 y.o. African American male with hypertension, type 2 DM, tobacco dependence, h/o alcohol abuse, obesity w/abnormal EKG., prostate adenocarcinoma  Patient was reportedly undergoing a colonoscopy on 09/08/2020, when he became bradycardic, hypotensive and was reportedly noted to be in new onset atrial fibrillation.  I do not have the tracings from these episode available for my review.  Patient was [facility] to [facility], ER.  EKG performed in the ER showed sinus rhythm.  At baseline, patient has limited physical activity, denies any chest pain or shortness of breath symptoms.  Since being diagnosed with prostate adenocarcinoma, he has noticed blood at the tip of his penis.  He has not noticed any frank hematuria.  He has an upcoming cystoscopy procedure by his urologist.  He is going to be started on radiation for management of adenocarcinoma.  Past Medical History:  Diagnosis Date  . Arthritis   . Benign prostatic hyperplasia   . Cirrhosis ([place])   . Colon polyps   . Diabetes mellitus without complication ([place])   . Hepatitis C   . History of kidney stones   . Hypertension   . Nicotine addiction   . Obesity   . Osteoarthritis of both knees   . Renal lithiasis      Past Surgical History:  Procedure Laterality Date  . COLONOSCOPY    . ESOPHAGOGASTRODUODENOSCOPY    . KIDNEY STONE SURGERY Right   . LITHOTRIPSY Left   . MULTIPLE EXTRACTIONS WITH ALVEOLOPLASTY N/A 09/09/2016   Procedure: MULTIPLE EXTRACTION WITH ALVEOLOPLASTY - one, two, six, seven, nine, ten, eleven, sixteen, 35, twenty, twenty-one, twenty-two, twenty-four, twenty-five, twenty-six, twenty-seven, twenty-eight, twenty-nine, thirty-one, thirty-two; alveolplasty; removal  bilateral mandible turi; removal cyst right mandible;  bone graft;  Surgeon: [name], DDS;  Location: [facility];  Service: Oral Su  . PILONIDAL CYST EXCISION     from spine  . TOTAL KNEE ARTHROPLASTY Bilateral 03/2020  . UPPER GASTROINTESTINAL ENDOSCOPY    . WISDOM TOOTH EXTRACTION     x4 removed     Social History   Tobacco Use  Smoking Status Current Every Day Smoker  . Packs/day: 0.25  . Types: Cigarettes  Smokeless Tobacco Never Used    Social History   Substance and Sexual Activity  Alcohol Use Yes   Comment: 3 per day     Family History  Problem Relation Age of Onset  . Lung cancer Mother        mets  . Diabetes Mother   . Diabetes Father   . Hyperlipidemia Father   . Colon cancer Father   . Diabetes Sister   . Hepatitis Brother   . Schizophrenia Brother   . Diabetes Sister   . Diabetes Sister   . Diabetes Brother   . Diabetes Brother   . Diabetes Maternal Grandmother   . Esophageal cancer Neg Hx   . Rectal cancer Neg Hx   . Stomach cancer Neg Hx      Current Outpatient Medications on File Prior to Visit  Medication Sig Dispense Refill  . amLODipine (NORVASC) 10 MG tablet Take 1 tablet (10 mg total) by mouth daily. 90 tablet 1  . carvedilol (COREG) 6.25 MG tablet  Take 1 tablet (6.25 mg total) by mouth 2 (two) times daily with a meal. 180 tablet 1  . indapamide (LOZOL) 1.25 MG tablet Take 1 tablet (1.25 mg total) by mouth daily. 90 tablet 0  . lidocaine (LIDODERM) 5 %     . losartan (COZAAR) 25 MG tablet Take 1 tablet (25 mg total) by mouth daily. 90 tablet 1  . metFORMIN (GLUCOPHAGE-XR) 500 MG 24 hr tablet Take 1 tablet (500 mg total) by mouth daily with breakfast. 90 tablet 1  . omeprazole (PRILOSEC) 40 MG capsule Use Prilosec (omeprazole) 40 mg PO BID for 6 weeks to promote mucosal healing, then reduce to 40 mg daily and continue to titrate to lowest effective dose if abdominal symptoms resolved. 180 capsule 1  . omeprazole (PRILOSEC) 40 MG capsule  Use Prilosec (omeprazole) 40 mg PO BID for 6 weeks to promote mucosal healing, then  reduce to 40 mg daily and continue to titrate to lowest effective dose if abdominal  symptoms resolved 180 capsule 1  . rosuvastatin (CRESTOR) 10 MG tablet Take 1 tablet (10 mg total) by mouth daily. 90 tablet 1  . tamsulosin (FLOMAX) 0.4 MG CAPS capsule Take 1 capsule (0.4 mg total) by mouth daily. 90 capsule 1   No current facility-administered medications on file prior to visit.    Cardiovascular and other pertinent studies:  EKG 10/09/2020: Sinus rhythm 80 bpm Possible old anteroseptal infarct  EKG 09/09/2020: Sinus rhythm Borderline first degree AV block Left axis deviation   Recent labs: Nov 2021-March 2022: Glucose 124, BUN/Cr <5/0.75. EGFR >60. Na/K 144/3.7. T. Bili 1.3/ Rest of the CMP normal H/H 14/42. MCV 89. Platelets 232 HbA1C 6.0% Chol 131, TG 89, HDL 49, LDL 64 TSH 0.7    Review of Systems  Cardiovascular: Negative for chest pain, dyspnea on exertion, leg swelling, palpitations and syncope.         Vitals:   10/09/20 0921  BP: (!) 143/87  Pulse: 79  SpO2: 96%     Body mass index is 38.84 kg/m. Filed Weights   10/09/20 0921  Weight: 237 lb (107.5 kg)     Objective:   Physical Exam Vitals and nursing note reviewed.  Constitutional:      General: He is not in acute distress. Neck:     Vascular: No JVD.  Cardiovascular:     Rate and Rhythm: Normal rate and regular rhythm.     Heart sounds: Normal heart sounds.  Pulmonary:     Effort: Pulmonary effort is normal.     Breath sounds: Normal breath sounds. No wheezing or rales.  Musculoskeletal:     Right lower leg: No edema.     Left lower leg: No edema.            Assessment & Recommendations:   70 y.o. African American male with hypertension, type 2 DM, tobacco dependence, h/o alcohol abuse, obesity w/abnormal EKG., prostate adenocarcinoma  Abnormal EKG: EKG today shows no significant  abnormalities.  However, patient reportedly had atrial fibrillation during colonoscopy on 09/09/2020.  I will try to obtain tracings from this event.  Placed him on 2-week monitor to establish EKG diagnosis of atrial fibrillation.  We will also check echocardiogram.  At this time, hold off anticoagulation pending further information.  Decision for anticoagulation will need to be weighed against risk of bleeding and patient with new diagnosis of prostate adenocarcinoma.  Further recommendations after above testing.   Thank you for referring the patient to [place]. Please  feel free to contact with any questions.   Nigel Mormon, MD Pager: [phone] Office: [phone]

## 2020-10-14 ENCOUNTER — Telehealth: Payer: Self-pay | Admitting: Radiation Oncology

## 2020-10-14 NOTE — Telephone Encounter (Signed)
Patient LVM with London Pepper, RN for a return call. Returned patient's call but he did not answer. LVM for a return call.

## 2020-10-20 ENCOUNTER — Other Ambulatory Visit: Payer: 59

## 2020-10-20 ENCOUNTER — Other Ambulatory Visit: Payer: Self-pay | Admitting: Internal Medicine

## 2020-10-20 DIAGNOSIS — N6310 Unspecified lump in the right breast, unspecified quadrant: Secondary | ICD-10-CM

## 2020-10-27 NOTE — Progress Notes (Signed)
GU Location of Tumor / Histology: Adenocarcinoma of Prostate  If Prostate Cancer, Gleason Score is (3 + 4) and PSA is (2.63 on 07/08/2020)  Bradley Hull underwent a CT to rule out hematuria and bilateral lower pole stones with renal cysts and right adrenal adenoma.  Patient also has been having a slowly trending upward and fluctuating PSA level.  Biopsies of Prostate on revealed:  09/23/2020        Past/Anticipated interventions by urology, if any:  2017 Cystoscopy Ureteroscopy 09/23/2020 Prostate Needle Biopsy  Past/Anticipated interventions by medical oncology, if any: none  Weight changes, if any: no  Bowel/Bladder complaints, if any: Bladder complaints of incomplete emptying/nocturia X3/weak urine stream/urgency.  Nausea/Vomiting, if any: no  Pain issues, if any:  no  SAFETY ISSUES:  Prior radiation? no  Pacemaker/ICD? no  Possible current pregnancy? no, male patient  Is the patient on methotrexate? no  Current Complaints / other details:  70 year old male. Retired. 2 children. Family history of renal cancer and prostate cancer.  Past Medical History: Hep C/Htn/DM/Cirrhosis

## 2020-10-30 ENCOUNTER — Other Ambulatory Visit: Payer: Self-pay

## 2020-10-30 ENCOUNTER — Encounter: Payer: Self-pay | Admitting: Medical Oncology

## 2020-10-30 ENCOUNTER — Ambulatory Visit
Admission: RE | Admit: 2020-10-30 | Discharge: 2020-10-30 | Disposition: A | Discharge status: Home / Self Care | Payer: 59 | Source: Ambulatory Visit | Attending: Radiation Oncology | Admitting: Radiation Oncology

## 2020-10-30 ENCOUNTER — Encounter: Payer: Self-pay | Admitting: Radiation Oncology

## 2020-10-30 VITALS — BP 129/79 | HR 84 | Temp 97.6°F | Resp 20 | Ht 66.0 in | Wt 228.6 lb

## 2020-10-30 DIAGNOSIS — Z7984 Long term (current) use of oral hypoglycemic drugs: Secondary | ICD-10-CM | POA: Insufficient documentation

## 2020-10-30 DIAGNOSIS — M1990 Unspecified osteoarthritis, unspecified site: Secondary | ICD-10-CM | POA: Insufficient documentation

## 2020-10-30 DIAGNOSIS — E669 Obesity, unspecified: Secondary | ICD-10-CM | POA: Insufficient documentation

## 2020-10-30 DIAGNOSIS — K7460 Unspecified cirrhosis of liver: Secondary | ICD-10-CM | POA: Insufficient documentation

## 2020-10-30 DIAGNOSIS — Z79899 Other long term (current) drug therapy: Secondary | ICD-10-CM | POA: Insufficient documentation

## 2020-10-30 DIAGNOSIS — E119 Type 2 diabetes mellitus without complications: Secondary | ICD-10-CM | POA: Diagnosis not present

## 2020-10-30 DIAGNOSIS — I10 Essential (primary) hypertension: Secondary | ICD-10-CM | POA: Diagnosis not present

## 2020-10-30 DIAGNOSIS — F17210 Nicotine dependence, cigarettes, uncomplicated: Secondary | ICD-10-CM | POA: Insufficient documentation

## 2020-10-30 DIAGNOSIS — N400 Enlarged prostate without lower urinary tract symptoms: Secondary | ICD-10-CM | POA: Diagnosis not present

## 2020-10-30 DIAGNOSIS — C61 Malignant neoplasm of prostate: Secondary | ICD-10-CM | POA: Diagnosis present

## 2020-10-30 HISTORY — DX: Malignant neoplasm of prostate: C61

## 2020-10-30 NOTE — Progress Notes (Signed)
Radiation Oncology         [phone] ________________________________  Initial outpatient Consultation  Name: Bradley Hull MRN: [id number]  Date: 10/30/2020  DOB: 03/22/1951  BJ:YNWGN, Arvid Right, MD  Irine Seal, MD   REFERRING PHYSICIAN: Irine Seal, MD  DIAGNOSIS: 70 y.o. gentleman with Stage T2a adenocarcinoma of the prostate with Gleason score of 3+4, and PSA of 2.63.    ICD-10-CM   1. Malignant neoplasm of prostate ([place])  C61     HISTORY OF PRESENT ILLNESS: Bradley Hull is a 70 y.o. male with a diagnosis of prostate cancer. He is an established urology patient, followed for urolithiasis and kidney stones in 2017 as well as AUR.  More recently, he was noted to have an elevated PSA of 3.71 on 12/24/19, by his primary care physician, Dr. Ronnald Ramp.  Accordingly, he was referred for evaluation in urology by Dr. Jeffie Pollock on 03/19/20,  digital rectal examination was performed at that time revealing a subtle 0.5 cm nodule at the left apex. He was having knee replacement in 03/2020, so they decided to postpone biopsy and monitor the PSA. A repeat PSA performed in 06/2020,had decreased to 2.63 and at the time of follow up on 08/17/20, he was found to have microscopic hematuria. He underwent CT A/P on 09/07/20, which was negative. The patient proceeded to transrectal ultrasound with 12 biopsies of the prostate on 09/23/20.  The prostate volume measured 34 cc.  Out of 12 core biopsies, 5 were positive.  The maximum Gleason score was 3+4, and this was seen in the left base lateral (with PNI and cribriform glands), left base (with cribriform glands), and left mid. Additionally, small foci of Gleason 3+3 were seen in the left apex and right base lateral.  The patient reviewed the biopsy results with his urologist and he has kindly been referred today for discussion of potential radiation treatment options.   PREVIOUS RADIATION THERAPY: No  PAST MEDICAL HISTORY:  Past Medical History:  Diagnosis Date  .  Arthritis   . Benign prostatic hyperplasia   . Cirrhosis ([place])   . Colon polyps   . Diabetes mellitus without complication ([place])   . Hepatitis C   . History of kidney stones   . Hypertension   . Nicotine addiction   . Obesity   . Osteoarthritis of both knees   . Prostate cancer ([place])   . Renal lithiasis       PAST SURGICAL HISTORY: Past Surgical History:  Procedure Laterality Date  . COLONOSCOPY    . ESOPHAGOGASTRODUODENOSCOPY    . KIDNEY STONE SURGERY Right   . LITHOTRIPSY Left   . MULTIPLE EXTRACTIONS WITH ALVEOLOPLASTY N/A 09/09/2016   Procedure: MULTIPLE EXTRACTION WITH ALVEOLOPLASTY - one, two, six, seven, nine, ten, eleven, sixteen, 12, twenty, twenty-one, twenty-two, twenty-four, twenty-five, twenty-six, twenty-seven, twenty-eight, twenty-nine, thirty-one, thirty-two; alveolplasty; removal bilateral mandible turi; removal cyst right mandible;  bone graft;  Surgeon: [name], DDS;  Location: [facility];  Service: Oral Su  . PILONIDAL CYST EXCISION     from spine  . TOTAL KNEE ARTHROPLASTY Bilateral 03/2020  . UPPER GASTROINTESTINAL ENDOSCOPY    . WISDOM TOOTH EXTRACTION     x4 removed    FAMILY HISTORY:  Family History  Problem Relation Age of Onset  . Lung cancer Mother        mets  . Diabetes Mother   . Diabetes Father   . Hyperlipidemia Father   . Colon cancer Father   . Diabetes  Sister   . Hepatitis Brother   . Schizophrenia Brother   . Diabetes Sister   . Diabetes Sister   . Diabetes Brother   . Diabetes Brother   . Diabetes Maternal Grandmother   . Esophageal cancer Neg Hx   . Rectal cancer Neg Hx   . Stomach cancer Neg Hx     SOCIAL HISTORY:  Social History   Socioeconomic History  . Marital status: Single    Spouse name: Not on file  . Number of children: 2  . Years of education: Not on file  . Highest education level: Not on file  Occupational History  . Occupation: retired  Tobacco Use  . Smoking status: Current Every Day Smoker     Packs/day: 0.25    Types: Cigarettes  . Smokeless tobacco: Never Used  Vaping Use  . Vaping Use: Never used  Substance and Sexual Activity  . Alcohol use: Yes    Comment: 3 per day  . Drug use: No  . Sexual activity: Yes  Other Topics Concern  . Not on file  Social History Narrative  . Not on file   Social Determinants of Health   Financial Resource Strain: Not on file  Food Insecurity: Not on file  Transportation Needs: Not on file  Physical Activity: Not on file  Stress: Not on file  Social Connections: Not on file  Intimate Partner Violence: Not on file    ALLERGIES: Dilaudid [hydromorphone hcl]  MEDICATIONS:  Current Outpatient Medications  Medication Sig Dispense Refill  . amLODipine (NORVASC) 10 MG tablet Take 1 tablet (10 mg total) by mouth daily. 90 tablet 1  . carvedilol (COREG) 6.25 MG tablet Take 1 tablet (6.25 mg total) by mouth 2 (two) times daily with a meal. 180 tablet 1  . indapamide (LOZOL) 1.25 MG tablet Take 1 tablet (1.25 mg total) by mouth daily. 90 tablet 0  . losartan (COZAAR) 25 MG tablet Take 25 mg by mouth daily.    . metFORMIN (GLUCOPHAGE-XR) 500 MG 24 hr tablet Take 1 tablet (500 mg total) by mouth daily with breakfast. 90 tablet 1  . omeprazole (PRILOSEC) 40 MG capsule Use Prilosec (omeprazole) 40 mg PO BID for 6 weeks to promote mucosal healing, then reduce to 40 mg daily and continue to titrate to lowest effective dose if abdominal symptoms resolved. 180 capsule 1  . rosuvastatin (CRESTOR) 10 MG tablet Take 1 tablet (10 mg total) by mouth daily. 90 tablet 1  . tamsulosin (FLOMAX) 0.4 MG CAPS capsule Take 1 capsule (0.4 mg total) by mouth daily. 90 capsule 1   No current facility-administered medications for this encounter.    REVIEW OF SYSTEMS:  On review of systems, the patient reports that he is doing well overall. He denies any chest pain, shortness of breath, cough, fevers, chills, night sweats, or unintended weight changes.  He  reports that following a recent endoscopy procedure, he was told that his blood pressure had dropped significantly and noted to have an irregular heart rate so he has just recently completed a Holter monitor study for Dr. Einar Gip, his cardiologist but is unaware of the results.  He denies any bowel disturbances, and denies abdominal pain, nausea or vomiting. He denies any new musculoskeletal or joint aches or pains. His IPSS was 27, indicating severe urinary symptoms with weak stream, straining to void, intermittency, hesitancy, frequency, urgency, feelings of incomplete bladder emptying and nocturia x4 despite taking Flomax daily as prescribed. His SHIM was 19, indicating  he has mild erectile dysfunction. A complete review of systems is obtained and is otherwise negative.    PHYSICAL EXAM:  Wt Readings from Last 3 Encounters:  10/30/20 228 lb 9.6 oz (103.7 kg)  10/09/20 237 lb (107.5 kg)  09/29/20 231 lb (104.8 kg)   Temp Readings from Last 3 Encounters:  10/30/20 97.6 F (36.4 C)  09/29/20 98.8 F (37.1 C) (Oral)  09/09/20 98.2 F (36.8 C)   BP Readings from Last 3 Encounters:  10/30/20 129/79  10/09/20 (!) 143/87  09/29/20 (!) 160/80   Pulse Readings from Last 3 Encounters:  10/30/20 84  10/09/20 79  09/29/20 74   Pain Assessment Pain Score: 0-No pain/10  In general this is a well appearing African-American male in no acute distress. He's alert and oriented x4 and appropriate throughout the examination. Cardiopulmonary assessment is negative for acute distress, and he exhibits normal effort.     KPS = 100  100 - Normal; no complaints; no evidence of disease. 90   - Able to carry on normal activity; minor signs or symptoms of disease. 80   - Normal activity with effort; some signs or symptoms of disease. 32   - Cares for self; unable to carry on normal activity or to do active work. 60   - Requires occasional assistance, but is able to care for most of his personal needs. 50    - Requires considerable assistance and frequent medical care. 70   - Disabled; requires special care and assistance. 57   - Severely disabled; hospital admission is indicated although death not imminent. 24   - Very sick; hospital admission necessary; active supportive treatment necessary. 10   - Moribund; fatal processes progressing rapidly. 0     - Dead  Karnofsky DA, Abelmann [place], Craver LS and Burchenal [facility] [phone]) The use of the nitrogen mustards in the palliative treatment of carcinoma: with particular reference to bronchogenic carcinoma Cancer 1 634-56  LABORATORY DATA:  Lab Results  Component Value Date   WBC 5.6 09/09/2020   HGB 14.2 09/09/2020   HCT 42.3 09/09/2020   MCV 89.4 09/09/2020   PLT 232 09/09/2020   Lab Results  Component Value Date   NA 144 09/09/2020   K 3.7 09/09/2020   CL 111 09/09/2020   CO2 24 09/09/2020   Lab Results  Component Value Date   ALT 15 09/09/2020   AST 24 09/09/2020   ALKPHOS 61 09/09/2020   BILITOT 1.3 (H) 09/09/2020     RADIOGRAPHY: No results found.    IMPRESSION/PLAN: 1. 69 y.o. gentleman with Stage T2a adenocarcinoma of the prostate with Gleason Score of 3+4, and PSA of 2.63. We discussed the patient's workup and outlined the nature of prostate cancer in this setting. The patient's T stage, Gleason's score, and PSA put him into the favorable intermediate risk group. Accordingly, he is eligible for a variety of potential treatment options including brachytherapy, 5.5 weeks of external radiation, or prostatectomy. We discussed the available radiation techniques, and focused on the details and logistics of delivery. The patient is not felt to be an ideal candidate for brachytherapy given his severe LUTS despite medical therapy with Flomax twice daily. Therefore, we discussed and outlined the risks, benefits, short and long-term effects associated with daily external beam radiotherapy and compared and contrasted these with prostatectomy. We  discussed the role of SpaceOAR gel in reducing the rectal toxicity associated with radiotherapy. He was encouraged to ask questions that were answered to his  stated satisfaction.  At the conclusion of our conversation, the patient is interested in moving forward with 5.5 weeks of external beam therapy. We will share our discussion with Dr. Jeffie Pollock and make arrangements for fiducial marker with SpaceOAR gel placement, prior to simulation, to reduce rectal toxicity from radiotherapy.  Due to difficulty tolerating his biopsy procedure in office, this will need to be performed in the OR under anesthesia. The patient appears to have a good understanding of his disease and our treatment recommendations which are of curative intent and is in agreement with the stated plan.  Therefore, we will move forward with treatment planning accordingly, in anticipation of beginning IMRT in the near future.    Nicholos Johns, PA-C    Tyler Pita, MD  [facility] Direct Dial: [phone]  Fax: [phone] [email or web address]  Skype  [email or web address]   This document serves as a record of services personally performed by Tyler Pita, MD and Freeman Caldron, PA-C. It was created on their behalf by Wilburn Mylar, a trained medical scribe. The creation of this record is based on the scribe's personal observations and the provider's statements to them. This document has been checked and approved by the attending provider.

## 2020-11-04 ENCOUNTER — Other Ambulatory Visit: Payer: Self-pay | Admitting: Urology

## 2020-11-08 DIAGNOSIS — I10 Essential (primary) hypertension: Secondary | ICD-10-CM | POA: Insufficient documentation

## 2020-11-08 NOTE — Progress Notes (Signed)
No show

## 2020-11-09 ENCOUNTER — Ambulatory Visit: Payer: 59 | Admitting: Cardiology

## 2020-11-09 DIAGNOSIS — I10 Essential (primary) hypertension: Secondary | ICD-10-CM

## 2020-11-10 ENCOUNTER — Encounter (HOSPITAL_BASED_OUTPATIENT_CLINIC_OR_DEPARTMENT_OTHER): Payer: Self-pay | Admitting: Urology

## 2020-11-12 ENCOUNTER — Ambulatory Visit
Admission: RE | Admit: 2020-11-12 | Discharge: 2020-11-12 | Disposition: A | Discharge status: Home / Self Care | Payer: 59 | Source: Ambulatory Visit | Attending: Internal Medicine | Admitting: Internal Medicine

## 2020-11-12 ENCOUNTER — Other Ambulatory Visit: Payer: Self-pay

## 2020-11-12 ENCOUNTER — Encounter (HOSPITAL_BASED_OUTPATIENT_CLINIC_OR_DEPARTMENT_OTHER): Payer: Self-pay | Admitting: Urology

## 2020-11-12 DIAGNOSIS — N6310 Unspecified lump in the right breast, unspecified quadrant: Secondary | ICD-10-CM

## 2020-11-12 NOTE — Progress Notes (Signed)
Spoke w/ via phone for pre-op interview---pt Lab needs dos----               Lab results------see below COVID test ------11-16-2020 905 Arrive at -------530 am 4-26 -2022 NPO after MN NO Solid Food.  Clear liquids from MN until---430 am then npo Med rec completed Medications to take morning of surgery -----rosuvastatin, coreg, amlodipine, flomax, prilosec Diabetic medication -----none day of surgery Patient instructed to bring photo id and insurance card day of surgery Patient aware to have Driver (ride ) / caregiver veronica walker sister    for 24 hours after surgery  Patient Special Instructions -----none Pre-Op special Istructions -----none Patient verbalized understanding of instructions that were given at this phone interview. Patient denies shortness of breath, chest pain, fever, cough at this phone interview.  LOV [facility]  10-09-2020 Epic/CHART MONITOR REPORT 10-09-2020 CHART/EPIC EKG 10-09-2020 CHART/EPIC [facility] Roosevelt Locks NP 07-29-2020 CARE EVERYWHERE/CHART  Spoke with dr Remo Lipps tuck mda and reviewed pt history and above charted reports and dr Remo Lipps turk aware pt did not get echo done 11-09-2020, pt ok for [facility] per dr Remo Lipps turk barring and acute status changesand to get I stat for pre op labs day of surgery 11-17-2020.  Patient instructed to call dr Geni Bers office and get echo scheduled for after 11-18-2020 surgery. Pt vocalized instructions to get echo done after 11-17-2020 surgery

## 2020-11-13 ENCOUNTER — Other Ambulatory Visit: Payer: Self-pay | Admitting: Internal Medicine

## 2020-11-13 DIAGNOSIS — N62 Hypertrophy of breast: Secondary | ICD-10-CM | POA: Insufficient documentation

## 2020-11-15 NOTE — H&P (Signed)
I have prostate cancer.  HPI: Bradley Hull is a 70 year-old male established patient who is here evaluation for treatment of prostate cancer.    Bradley Hull returns today in f/u for cystoscopy but his prostate biopsy results are back. He had a 59ml prostate with a left mid prostate hypoechoic lesion in the area of the nodule. He had 4/6 left cores positive with 3 Gleason 7(3+4) up to 70% in the left base medial core and 1/6 right cores with Gleason 6 in 5% of the right base lateral core. He has T2a N0 Mx favorable intermediate risk prostate cancer. He had a CT for hematuria that showed bilateral lower pole stones and renal cysts and a right adrenal adenoma. His IPSS is 18 and he reports his LUTS have been improving.      AUA Symptom Score: 50% of the time he has the sensation of not emptying his bladder completely when finished urinating. More than 50% of the time he has to urinate again fewer than two hours after he has finished urinating. 50% of the time he has to start and stop again several times when he urinates. Less than 20% of the time he finds it difficult to postpone urination. 50% of the time he has a weak urinary stream. Less than 20% of the time he has to push or strain to begin urination. He has to get up to urinate 3 times from the time he goes to bed until the time he gets up in the morning.   Calculated AUA Symptom Score: 18    ALLERGIES: Hydromorphone - Itching (Mild)    MEDICATIONS: Hydrochlorothiazide 25 mg tablet  Tamsulosin Hcl 0.4 mg capsule 2 capsule PO Daily  Amlodipine Besylate 5 mg tablet  Carvedilol 6.25 mg tablet  Hydrocodone-Acetaminophen 5 mg-325 mg tablet  Janumet Xr 50 mg-1,000 mg tablet,extended release multiphase 24 hr     GU PSH: Cystoscopy Ureteroscopy - 2017 ESWL - 2017 Locm 300-399Mg /Ml Iodine,1Ml - 09/07/2020 Prostate Needle Biopsy - 09/23/2020       PSH Notes: Cystoscopy With Ureteroscopy Left, Renal Lithotripsy, Knee Surgery, Pilonidal Cyst Resection    NON-GU PSH: Remove Pilonidal Lesion - 2017 Surgical Pathology, Gross And Microscopic Examination For Prostate Needle - 09/23/2020     GU PMH: Elevated PSA - 09/23/2020, His PSA is up to 3.71 but it was 3.6 in 2019. He has a subtle soft nodule at the left apex. Since he is having knee replacements tomorrow, I will just have him return in 4 months with a repeat PSA, but with the nodule he will probably need a biopsy. , - 03/19/2020 Prostate nodule w/ LUTS - 09/23/2020, - 08/17/2020, He has moderate to severe LUTS and has a known large prostate with prior retention. He will stay on tamsulosin. , - 03/19/2020 Microscopic hematuria - 09/07/2020, - 08/17/2020 Nocturia (Stable) - 08/17/2020, - 03/19/2020 Straining on Urination - 08/17/2020 Urinary Frequency - 08/17/2020 Weak Urinary Stream (Stable) - 08/17/2020, - 03/19/2020 Urinary Retention - 2019 Ureteral calculus, Calculus of right ureter - 2017 History of urolithiasis, History of kidney stones - 2017    NON-GU PMH: Encounter for general adult medical examination without abnormal findings, Encounter for preventive health examination - 2017 Personal history of other diseases of the circulatory system, History of hypertension - 2017 Personal history of other diseases of the digestive system, History of cirrhosis - 2017 Personal history of other diseases of the musculoskeletal system and connective tissue, History of arthritis - 2017 Personal history of other endocrine,  nutritional and metabolic disease, History of diabetes mellitus - 2017 Personal history of other infectious and parasitic diseases, History of hepatitis C virus infection - 2017    FAMILY HISTORY: malignant neoplasm of kidney - Runs In Family malignant neoplasm of prostate - Runs In Family renal failure - Runs In Family   SOCIAL HISTORY: Marital Status: Single Preferred Language: English; Ethnicity: Not Hispanic Or Latino; Race: Black or African American Current Smoking Status: Patient  smokes.   Tobacco Use Assessment Completed: Used Tobacco in last 30 days? Does drink.  Does not drink caffeine.     Notes: Alcohol use, Single, Number of children, Current smoker, Caffeine use   REVIEW OF SYSTEMS:    GU Review Male:   Patient denies frequent urination, hard to postpone urination, burning/ pain with urination, get up at night to urinate, leakage of urine, stream starts and stops, trouble starting your stream, have to strain to urinate , erection problems, and penile pain.  Gastrointestinal (Upper):   Patient denies nausea, vomiting, and indigestion/ heartburn.  Gastrointestinal (Lower):   Patient denies diarrhea and constipation.  Constitutional:   Patient denies fever, night sweats, weight loss, and fatigue.  Skin:   Patient denies skin rash/ lesion and itching.  Eyes:   Patient denies blurred vision and double vision.  Ears/ Nose/ Throat:   Patient denies sore throat and sinus problems.  Hematologic/Lymphatic:   Patient denies swollen glands and easy bruising.  Cardiovascular:   Patient denies leg swelling and chest pains.  Respiratory:   Patient denies cough and shortness of breath.  Endocrine:   Patient denies excessive thirst.  Musculoskeletal:   Patient denies back pain and joint pain.  Neurological:   Patient denies headaches and dizziness.  Psychologic:   Patient denies depression and anxiety.   VITAL SIGNS: None   Complexity of Data:  Records Review:   AUA Symptom Score, Pathology Reports  Urine Test Review:   Urinalysis  X-Ray Review: C.T. Hematuria: Reviewed Films. Reviewed Report. Discussed With Patient.     07/08/20 12/24/19 07/19/18 02/28/18 10/09/17 03/01/16  PSA  Total PSA 2.63 ng/mL 3.71 ng/dl 1.67 ng/dl 3.60 ng/dl 1.66 ng/dl 1.66 ng/dl    PROCEDURES:          Urinalysis Dipstick Dipstick Cont'd  Color: Yellow Bilirubin: Neg mg/dL  Appearance: Clear Ketones: Neg mg/dL  Specific Gravity: 1.025 Blood: Neg ery/uL  pH: 6.0 Protein: Trace mg/dL   Glucose: Neg mg/dL Urobilinogen: 0.2 mg/dL    Nitrites: Neg    Leukocyte Esterase: Neg leu/uL    ASSESSMENT:      ICD-10 Details  1 GU:   Prostate Cancer - C61 Chronic, Threat to Bodily Function - He has T2a N0 Mx Gleason 7(3+4) favorable intermediate risk prostate cancer with moderate LUTS on tamsulosin and mild ED. I discussed options including AS, RP, EXRT, Seeds, Cryo and HIFU and he is interested in a Seed implant so I will set him up with Dr. Tammi Klippel.   2   Microscopic hematuria - R31.21 Minor - The CT showed small lower pole renal stones and cyst. The bladder was unremarkable. Since he is interested in a seed implant and that has an associated cystoscopy, I will just defer cystoscopy until then.   3   Prostate nodule w/ LUTS - N40.3 Chronic, Stable - His LUTS remain Moderate but his IPSS is down some. I am going to increase the tamsulosin to 0.8mg  daily since he is interested in seeds and will be at higher risk  for post op LUTS.   4   Weak Urinary Stream - R39.12 Chronic, Improving  5   Renal calculus - N20.0 Undiagnosed New Problem - He has bilateral lower pole stones, left > right, and will need that monitored over time.             Medications Refill Meds: Tamsulosin Hcl 0.4 mg capsule 2 capsule PO Daily   #180  3 Refill(s)       Addendum:   He has seen Dr. Tammi Klippel who feels he needs EXRT.  He is here today to have SpaceOAR and fiducial markers placed.  He was not felt to be a candidate to have that in the Office.   I was going to do flexible cystoscopy at the time of the seed implant and will discuss that with him prior to surgery and add that if he is agreeable.

## 2020-11-16 ENCOUNTER — Other Ambulatory Visit (HOSPITAL_COMMUNITY)
Admission: RE | Admit: 2020-11-16 | Discharge: 2020-11-16 | Disposition: A | Discharge status: Home / Self Care | Payer: 59 | Source: Ambulatory Visit | Attending: Urology | Admitting: Urology

## 2020-11-16 DIAGNOSIS — Z01812 Encounter for preprocedural laboratory examination: Secondary | ICD-10-CM | POA: Insufficient documentation

## 2020-11-16 DIAGNOSIS — Z20822 Contact with and (suspected) exposure to covid-19: Secondary | ICD-10-CM | POA: Diagnosis not present

## 2020-11-17 ENCOUNTER — Encounter (HOSPITAL_BASED_OUTPATIENT_CLINIC_OR_DEPARTMENT_OTHER)
Admission: RE | Disposition: A | Discharge status: Home / Self Care | Payer: Self-pay | Source: Ambulatory Visit | Attending: Urology

## 2020-11-17 ENCOUNTER — Ambulatory Visit (HOSPITAL_BASED_OUTPATIENT_CLINIC_OR_DEPARTMENT_OTHER): Payer: 59 | Admitting: Anesthesiology

## 2020-11-17 ENCOUNTER — Ambulatory Visit (HOSPITAL_BASED_OUTPATIENT_CLINIC_OR_DEPARTMENT_OTHER)
Admission: RE | Admit: 2020-11-17 | Discharge: 2020-11-17 | Disposition: A | Discharge status: Home / Self Care | Payer: 59 | Source: Ambulatory Visit | Attending: Urology | Admitting: Urology

## 2020-11-17 ENCOUNTER — Encounter (HOSPITAL_BASED_OUTPATIENT_CLINIC_OR_DEPARTMENT_OTHER): Payer: Self-pay | Admitting: Urology

## 2020-11-17 DIAGNOSIS — Z79899 Other long term (current) drug therapy: Secondary | ICD-10-CM | POA: Diagnosis not present

## 2020-11-17 DIAGNOSIS — Z885 Allergy status to narcotic agent status: Secondary | ICD-10-CM | POA: Diagnosis not present

## 2020-11-17 DIAGNOSIS — N200 Calculus of kidney: Secondary | ICD-10-CM | POA: Diagnosis not present

## 2020-11-17 DIAGNOSIS — F17200 Nicotine dependence, unspecified, uncomplicated: Secondary | ICD-10-CM | POA: Insufficient documentation

## 2020-11-17 DIAGNOSIS — R3912 Poor urinary stream: Secondary | ICD-10-CM | POA: Diagnosis not present

## 2020-11-17 DIAGNOSIS — N403 Nodular prostate with lower urinary tract symptoms: Secondary | ICD-10-CM | POA: Insufficient documentation

## 2020-11-17 DIAGNOSIS — D3501 Benign neoplasm of right adrenal gland: Secondary | ICD-10-CM | POA: Diagnosis not present

## 2020-11-17 DIAGNOSIS — C61 Malignant neoplasm of prostate: Secondary | ICD-10-CM | POA: Insufficient documentation

## 2020-11-17 HISTORY — PX: SPACE OAR INSTILLATION: SHX6769

## 2020-11-17 HISTORY — DX: Type 2 diabetes mellitus without complications: E11.9

## 2020-11-17 HISTORY — DX: Other complications of anesthesia, initial encounter: T88.59XA

## 2020-11-17 HISTORY — PX: CYSTOSCOPY: SHX5120

## 2020-11-17 LAB — GLUCOSE, CAPILLARY: Glucose-Capillary: 119 mg/dL — ABNORMAL HIGH (ref 70–99)

## 2020-11-17 LAB — SARS CORONAVIRUS 2 (TAT 6-24 HRS): SARS Coronavirus 2: NEGATIVE

## 2020-11-17 LAB — POCT I-STAT, CHEM 8
BUN: 9 mg/dL (ref 8–23)
Calcium, Ion: 1.14 mmol/L — ABNORMAL LOW (ref 1.15–1.40)
Chloride: 101 mmol/L (ref 98–111)
Creatinine, Ser: 0.6 mg/dL — ABNORMAL LOW (ref 0.61–1.24)
Glucose, Bld: 112 mg/dL — ABNORMAL HIGH (ref 70–99)
HCT: 38 % — ABNORMAL LOW (ref 39.0–52.0)
Hemoglobin: 12.9 g/dL — ABNORMAL LOW (ref 13.0–17.0)
Potassium: 3.8 mmol/L (ref 3.5–5.1)
Sodium: 138 mmol/L (ref 135–145)
TCO2: 28 mmol/L (ref 22–32)

## 2020-11-17 SURGERY — INJECTION, HYDROGEL SPACER
Anesthesia: General | Site: Rectum

## 2020-11-17 MED ORDER — FENTANYL CITRATE (PF) 100 MCG/2ML IJ SOLN
25.0000 ug | INTRAMUSCULAR | Status: DC | PRN
  Administered 2020-11-17 (×2): 25 ug via INTRAVENOUS

## 2020-11-17 MED ORDER — SODIUM CHLORIDE 0.9 % IV SOLN
2.0000 g | INTRAVENOUS | Status: AC
  Administered 2020-11-17: 2 g via INTRAVENOUS

## 2020-11-17 MED ORDER — PROPOFOL 10 MG/ML IV BOLUS
INTRAVENOUS | Status: AC
  Filled 2020-11-17: qty 40

## 2020-11-17 MED ORDER — FENTANYL CITRATE (PF) 100 MCG/2ML IJ SOLN
INTRAMUSCULAR | Status: AC
  Filled 2020-11-17: qty 2

## 2020-11-17 MED ORDER — DEXAMETHASONE SODIUM PHOSPHATE 10 MG/ML IJ SOLN
INTRAMUSCULAR | Status: DC | PRN
  Administered 2020-11-17: 4 mg via INTRAVENOUS

## 2020-11-17 MED ORDER — LACTATED RINGERS IV SOLN: INTRAVENOUS | Status: DC

## 2020-11-17 MED ORDER — DIPHENHYDRAMINE HCL 50 MG/ML IJ SOLN
INTRAMUSCULAR | Status: AC
  Filled 2020-11-17: qty 1

## 2020-11-17 MED ORDER — OXYCODONE HCL 5 MG/5ML PO SOLN: 5.0000 mg | Freq: Once | ORAL | Status: DC | PRN

## 2020-11-17 MED ORDER — DEXAMETHASONE SODIUM PHOSPHATE 10 MG/ML IJ SOLN
INTRAMUSCULAR | Status: AC
  Filled 2020-11-17: qty 1

## 2020-11-17 MED ORDER — OXYCODONE HCL 5 MG PO TABS: 5.0000 mg | ORAL_TABLET | Freq: Once | ORAL | Status: DC | PRN

## 2020-11-17 MED ORDER — LIDOCAINE HCL (CARDIAC) PF 100 MG/5ML IV SOSY
PREFILLED_SYRINGE | INTRAVENOUS | Status: DC | PRN
  Administered 2020-11-17: 60 mg via INTRATRACHEAL

## 2020-11-17 MED ORDER — SODIUM CHLORIDE (PF) 0.9 % IJ SOLN
INTRAMUSCULAR | Status: DC | PRN
  Administered 2020-11-17: 10 mL

## 2020-11-17 MED ORDER — CEFTRIAXONE SODIUM 2 G IJ SOLR
INTRAMUSCULAR | Status: AC
  Filled 2020-11-17: qty 20

## 2020-11-17 MED ORDER — SODIUM CHLORIDE 0.9 % IV SOLN
INTRAVENOUS | Status: AC
  Filled 2020-11-17: qty 100

## 2020-11-17 MED ORDER — FENTANYL CITRATE (PF) 100 MCG/2ML IJ SOLN
INTRAMUSCULAR | Status: DC | PRN
  Administered 2020-11-17 (×2): 50 ug via INTRAVENOUS

## 2020-11-17 MED ORDER — ONDANSETRON HCL 4 MG/2ML IJ SOLN
INTRAMUSCULAR | Status: AC
  Filled 2020-11-17: qty 2

## 2020-11-17 MED ORDER — MIDAZOLAM HCL 2 MG/2ML IJ SOLN
INTRAMUSCULAR | Status: AC
  Filled 2020-11-17: qty 2

## 2020-11-17 MED ORDER — SODIUM CHLORIDE 0.9% FLUSH: 3.0000 mL | Freq: Two times a day (BID) | INTRAVENOUS | Status: DC

## 2020-11-17 MED ORDER — KETOROLAC TROMETHAMINE 30 MG/ML IJ SOLN
INTRAMUSCULAR | Status: DC | PRN
  Administered 2020-11-17: 15 mg via INTRAVENOUS

## 2020-11-17 MED ORDER — DIPHENHYDRAMINE HCL 50 MG/ML IJ SOLN: 12.5000 mg | Freq: Once | INTRAMUSCULAR | Status: DC

## 2020-11-17 MED ORDER — SODIUM CHLORIDE 0.9 % IV SOLN: INTRAVENOUS | Status: DC

## 2020-11-17 MED ORDER — ONDANSETRON HCL 4 MG/2ML IJ SOLN: 4.0000 mg | Freq: Once | INTRAMUSCULAR | Status: DC | PRN

## 2020-11-17 MED ORDER — ONDANSETRON HCL 4 MG/2ML IJ SOLN
INTRAMUSCULAR | Status: DC | PRN
  Administered 2020-11-17: 4 mg via INTRAVENOUS

## 2020-11-17 MED ORDER — PROPOFOL 10 MG/ML IV BOLUS
INTRAVENOUS | Status: DC | PRN
  Administered 2020-11-17: 150 mg via INTRAVENOUS

## 2020-11-17 MED ORDER — LIDOCAINE 2% (20 MG/ML) 5 ML SYRINGE
INTRAMUSCULAR | Status: AC
  Filled 2020-11-17: qty 5

## 2020-11-17 MED ORDER — MIDAZOLAM HCL 2 MG/2ML IJ SOLN
INTRAMUSCULAR | Status: DC | PRN
  Administered 2020-11-17: 2 mg via INTRAVENOUS

## 2020-11-17 MED ORDER — KETOROLAC TROMETHAMINE 30 MG/ML IJ SOLN
INTRAMUSCULAR | Status: AC
  Filled 2020-11-17: qty 1

## 2020-11-17 SURGICAL SUPPLY — 20 items
DRSG TEGADERM 4X4.75 (GAUZE/BANDAGES/DRESSINGS) ×5 IMPLANT
DRSG TEGADERM 8X12 (GAUZE/BANDAGES/DRESSINGS) ×6 IMPLANT
GAUZE SPONGE 4X4 12PLY STRL (GAUZE/BANDAGES/DRESSINGS) ×1 IMPLANT
GLOVE SURG ENC MOIS LTX SZ6.5 (GLOVE) ×1 IMPLANT
GLOVE SURG ENC MOIS LTX SZ7.5 (GLOVE) ×1 IMPLANT
GLOVE SURG ORTHO LTX SZ8.5 (GLOVE) ×3 IMPLANT
GLOVE SURG POLYISO LF SZ8 (GLOVE) ×3 IMPLANT
GLOVE SURG UNDER POLY LF SZ6.5 (GLOVE) ×2 IMPLANT
GLOVE SURG UNDER POLY LF SZ7 (GLOVE) ×2 IMPLANT
GOWN STRL REUS W/ TWL LRG LVL3 (GOWN DISPOSABLE) IMPLANT
GOWN STRL REUS W/TWL LRG LVL3 (GOWN DISPOSABLE) ×4 IMPLANT
GOWN STRL REUS W/TWL XL LVL3 (GOWN DISPOSABLE) ×3 IMPLANT
IMPL SPACEOAR VUE SYSTEM (Spacer) IMPLANT
IMPLANT SPACEOAR VUE SYSTEM (Spacer) ×3 IMPLANT
KIT TURNOVER CYSTO (KITS) ×3 IMPLANT
MARKER GOLD PRELOAD 1.2X3 (Urological Implant) IMPLANT
PACK CYSTO (CUSTOM PROCEDURE TRAY) ×3 IMPLANT
SEED GOLD PRELOAD 1.2X3 (Urological Implant) ×9 IMPLANT
SURGILUBE 2OZ TUBE FLIPTOP (MISCELLANEOUS) IMPLANT
UNDERPAD 30X36 HEAVY ABSORB (UNDERPADS AND DIAPERS) ×6 IMPLANT

## 2020-11-17 NOTE — Op Note (Signed)
Procedure: 1.  Ultrasound-guided placement of SpaceOAR gel. 2.  Ultrasound-guided placement of gold fiducial markers. 3.  Flexible cystoscopy.  Preop diagnosis: Prostate cancer microhematuria.  Postop diagnosis: Same.  Surgeon: Dr. Irine Seal.  Anesthesia: General.  Specimen: None.  Drain: None.  EBL: None.  Complications: None.  Indications: The patient is a 70 year old male who has prostate cancer and microhematuria.  He needs fiducial markers, SpaceOAR and cystoscopy but he did not think he could tolerate the procedure in the office without a more general anesthetic.  Procedure: He was taken operating room where he was given antibiotics.  A general anesthetic was induced.  He was placed in lithotomy position and fitted with PAS hose.  His genitalia was held out of the field using a towel and OpSite.  The 10 MHz ultrasound probe was then assembled and inserted by Dr. Tammi Klippel and secured to the stepper base.  His perineum and genitalia were prepped with Betadine solution.  The gold fiducial markers were then placed under ultrasound guidance in the right mid base, right mid apex of the prostate and the left mid lateral prostate.  The SpaceOAR needle was then inserted under ultrasound guidance in the midline and placed in the fat stripe between the prostate and rectum between the base and midportion of the prostate.  The needle was aspirated with no blood return and then a puff of saline was injected confirming good positioning.  The SpaceOAR Vue polymer was then injected over 12 seconds into the space with excellent distribution confirmed by ultrasound.  The ultrasound probe was removed and the genitalia was exposed.  The genitalia was reprepped with Betadine and flexible cystoscopy was performed.  Flexible cystoscopy demonstrated normal urethra.  The external sphincter was intact.  The prostatic urethra was approximately 2 to 3 cm in length with some lateral lobe hyperplasia but  without significant obstruction.  Examination of the bladder revealed mild trabeculation.  No mucosal lesions were identified.  Ureteral orifices were unremarkable.  The scope was then removed.  He was taken down from lithotomy position, his anesthetic was reversed and he was moved recovery room in stable condition.  There were no complications.

## 2020-11-17 NOTE — Anesthesia Procedure Notes (Signed)
Procedure Name: LMA Insertion Date/Time: 11/17/2020 7:50 AM Performed by: Bonney Aid, CRNA Pre-anesthesia Checklist: Patient identified, Emergency Drugs available, Suction available and Patient being monitored Patient Re-evaluated:Patient Re-evaluated prior to induction Oxygen Delivery Method: Circle system utilized Preoxygenation: Pre-oxygenation with 100% oxygen Induction Type: IV induction Ventilation: Mask ventilation without difficulty LMA: LMA inserted LMA Size: 5.0 Number of attempts: 1 Airway Equipment and Method: Bite block Placement Confirmation: positive ETCO2 Tube secured with: Tape Dental Injury: Teeth and Oropharynx as per pre-operative assessment

## 2020-11-17 NOTE — Discharge Instructions (Addendum)
  Post Anesthesia Home Care Instructions  Activity: Get plenty of rest for the remainder of the day. A responsible individual must stay with you for 24 hours following the procedure.  For the next 24 hours, DO NOT: -Drive a car -Paediatric nurse -Drink alcoholic beverages -Take any medication unless instructed by your physician -Make any legal decisions or sign important papers.  Meals: Start with liquid foods such as gelatin or soup. Progress to regular foods as tolerated. Avoid greasy, spicy, heavy foods. If nausea and/or vomiting occur, drink only clear liquids until the nausea and/or vomiting subsides. Call your physician if vomiting continues.  Special Instructions/Symptoms: Your throat may feel dry or sore from the anesthesia or the breathing tube placed in your throat during surgery. If this causes discomfort, gargle with warm salt water. The discomfort should disappear within 24 hours.  If you had a scopolamine patch placed behind your ear for the management of post- operative nausea and/or vomiting:  1. The medication in the patch is effective for 72 hours, after which it should be removed.  Wrap patch in a tissue and discard in the trash. Wash hands thoroughly with soap and water. 2. You may remove the patch earlier than 72 hours if you experience unpleasant side effects which may include dry mouth, dizziness or visual disturbances. 3. Avoid touching the patch. Wash your hands with soap and water after contact with the patch.    CYSTOSCOPY HOME CARE INSTRUCTIONS  Activity: Rest for the remainder of the day.  Do not drive or operate equipment today.  You may resume normal activities in one to two days as instructed by your physician.   Meals: Drink plenty of liquids and eat light foods such as gelatin or soup this evening.  You may return to a normal meal plan tomorrow.  Return to Work: You may return to work in one to two days or as instructed by your physician.  Special  Instructions / Symptoms: Call your physician if any of these symptoms occur:   -persistent or heavy bleeding  -bleeding which continues after first few urination  -large blood clots that are difficult to pass  -urine stream diminishes or stops completely  -fever equal to or higher than 101 degrees Farenheit.  -cloudy urine with a strong, foul odor  -severe pain  Females should always wipe from front to back after elimination.  You may feel some burning pain when you urinate.  This should disappear with time.  Applying moist heat to the lower abdomen or a hot tub bath may help relieve the pain. \  You may use tylenol or ibuprofen for pain.

## 2020-11-17 NOTE — Anesthesia Postprocedure Evaluation (Signed)
Anesthesia Post Note  Patient: Bradley Hull  Procedure(s) Performed: SPACE OAR INSTILLATION FIDUCIAL MARKER (N/A Rectum) CYSTOSCOPY FLEXIBLE (Bladder)     Patient location during evaluation: PACU Anesthesia Type: General Level of consciousness: awake and alert, oriented and patient cooperative Pain management: pain level controlled Vital Signs Assessment: post-procedure vital signs reviewed and stable Respiratory status: spontaneous breathing, nonlabored ventilation and respiratory function stable Cardiovascular status: blood pressure returned to baseline and stable Postop Assessment: no apparent nausea or vomiting Anesthetic complications: no   No complications documented.  Last Vitals:  Vitals:   11/17/20 0848 11/17/20 0934  BP: 138/83 (!) 152/80  Pulse: (!) 57 60  Resp: 13 20  Temp:    SpO2: 99% 100%    Last Pain:  Vitals:   11/17/20 0934  TempSrc:   PainSc: [place]

## 2020-11-17 NOTE — Interval H&P Note (Signed)
History and Physical Interval Note:  We discussed adding the cystoscopy for today and he is agreeable.   11/17/2020 7:30 AM  Bradley Hull  has presented today for surgery, with the diagnosis of PROSTATE CANCER.  The various methods of treatment have been discussed with the patient and family. After consideration of risks, benefits and other options for treatment, the patient has consented to  Procedure(s) with comments: SPACE OAR INSTILLATION FIDUCIAL MARKER (N/A) - prostate as a surgical intervention.  The patient's history has been reviewed, patient examined, no change in status, stable for surgery.  I have reviewed the patient's chart and labs.  Questions were answered to the patient's satisfaction.     Irine Seal

## 2020-11-17 NOTE — Progress Notes (Signed)
  Radiation Oncology         [phone] ________________________________  Name: Bradley Hull MRN: [id number]  Date: 11/04/2020  DOB: 05-18-1951  Chart Note:  I assisted Dr. Jeffie Pollock in placing three fiducial markers and SpaceOAR using the radiation oncology transrectal ultrasound stepper in the OR.  The procedure went well with good positioning.  Plan:  At this point, the patient is set up to proceed with IMRT simulation on 11/20/20.  ________________________________  Sheral Apley. Tammi Klippel, M.D.

## 2020-11-17 NOTE — Transfer of Care (Addendum)
Immediate Anesthesia Transfer of Care Note  Patient: Bradley Hull  Procedure(s) Performed: SPACE OAR INSTILLATION FIDUCIAL MARKER (N/A Rectum) CYSTOSCOPY FLEXIBLE (Bladder)  Patient Location: PACU  Anesthesia Type:General  Level of Consciousness: awake, alert  and oriented  Airway & Oxygen Therapy: Patient Spontanous Breathing and Patient connected to nasal cannula oxygen  Post-op Assessment: Report given to RN, co of itching to legs, no rash seen   Post vital signs: Reviewed and stable  Last Vitals:  Vitals Value Taken Time  BP 148/80 11/17/20 0821  Temp 36.4 C 11/17/20 0821  Pulse 70 11/17/20 0822  Resp 13 11/17/20 0822  SpO2 100 % 11/17/20 0822  Vitals shown include unvalidated device data.  Last Pain:  Vitals:   11/17/20 0602  TempSrc: Oral  PainSc: 0-No pain      Patients Stated Pain Goal: 5 (21/11/55 2080)  Complications: No complications documented.

## 2020-11-17 NOTE — Anesthesia Preprocedure Evaluation (Addendum)
Anesthesia Evaluation  Patient identified by MRN, date of birth, ID band Patient awake    Reviewed: Allergy & Precautions, NPO status , Patient's Chart, lab work & pertinent test results, reviewed documented beta blocker date and time   History of Anesthesia Complications (+) history of anesthetic complications (EGD/cscope Feb 2022 w/ sedation provided by endoscopist- pet pt BP dropped and inc HR. has had GA prior to this without issue)  Airway Mallampati: III  TM Distance: >3 FB Neck ROM: Full    Dental  (+) Edentulous Upper, Edentulous Lower   Pulmonary Current Smoker,  13 pack year history    Pulmonary exam normal breath sounds clear to auscultation       Cardiovascular hypertension (150/79 in preop), Pt. on medications and Pt. on home beta blockers Normal cardiovascular exam Rhythm:Regular Rate:Normal     Neuro/Psych negative neurological ROS  negative psych ROS   GI/Hepatic negative GI ROS, (+) Cirrhosis  (nml plt, INR)    substance abuse (etOH abuse)  alcohol use, Hepatitis - (treated), C  Endo/Other  diabetes, Well Controlled, Type 2, Oral Hypoglycemic AgentsObesity BMI 39 Last a1c 6.0  Renal/GU negative Renal ROS   Prostate ca    Musculoskeletal  (+) Arthritis , Osteoarthritis,    Abdominal (+) + obese,   Peds negative pediatric ROS (+)  Hematology negative hematology ROS (+)   Anesthesia Other Findings   Reproductive/Obstetrics negative OB ROS                            Anesthesia Physical Anesthesia Plan  ASA: III  Anesthesia Plan: General   Post-op Pain Management:    Induction: Intravenous  PONV Risk Score and Plan: 1 and Ondansetron, Dexamethasone, Midazolam and Treatment may vary due to age or medical condition  Airway Management Planned: LMA  Additional Equipment: None  Intra-op Plan:   Post-operative Plan: Extubation in OR  Informed Consent: I have  reviewed the patients History and Physical, chart, labs and discussed the procedure including the risks, benefits and alternatives for the proposed anesthesia with the patient or authorized representative who has indicated his/her understanding and acceptance.     Dental advisory given  Plan Discussed with: CRNA  Anesthesia Plan Comments:         Anesthesia Quick Evaluation

## 2020-11-18 ENCOUNTER — Ambulatory Visit: Payer: 59

## 2020-11-18 ENCOUNTER — Other Ambulatory Visit: Payer: Self-pay

## 2020-11-18 DIAGNOSIS — R9431 Abnormal electrocardiogram [ECG] [EKG]: Secondary | ICD-10-CM

## 2020-11-18 DIAGNOSIS — I10 Essential (primary) hypertension: Secondary | ICD-10-CM

## 2020-11-18 LAB — HM DIABETES EYE EXAM

## 2020-11-19 ENCOUNTER — Telehealth: Payer: Self-pay | Admitting: *Deleted

## 2020-11-19 NOTE — Telephone Encounter (Signed)
Called patient to remind of sim appt. for 11-20-20 - arrival time- 2:15 pm @ Dr. Johny Shears office, lvm for a return call

## 2020-11-20 ENCOUNTER — Encounter (HOSPITAL_BASED_OUTPATIENT_CLINIC_OR_DEPARTMENT_OTHER): Payer: Self-pay | Admitting: Urology

## 2020-11-20 ENCOUNTER — Ambulatory Visit
Admission: RE | Admit: 2020-11-20 | Discharge: 2020-11-20 | Disposition: A | Discharge status: Home / Self Care | Payer: 59 | Source: Ambulatory Visit | Attending: Radiation Oncology | Admitting: Radiation Oncology

## 2020-11-20 ENCOUNTER — Other Ambulatory Visit: Payer: Self-pay

## 2020-11-20 DIAGNOSIS — C61 Malignant neoplasm of prostate: Secondary | ICD-10-CM

## 2020-11-21 NOTE — Progress Notes (Signed)
  Radiation Oncology         [phone] ________________________________  Name: Bradley Hull MRN: [id number]  Date: 11/20/2020  DOB: [birth year]  SIMULATION AND TREATMENT PLANNING NOTE    ICD-10-CM   1. Malignant neoplasm of prostate ([place])  C61     DIAGNOSIS:  70 y.o. gentleman with Stage T2a adenocarcinoma of the prostate with Gleason score of 3+4, and PSA of 2.63.  NARRATIVE:  The patient was brought to the [facility].  Identity was confirmed.  All relevant records and images related to the planned course of therapy were reviewed.  The patient freely provided informed written consent to proceed with treatment after reviewing the details related to the planned course of therapy. The consent form was witnessed and verified by the simulation staff.  Then, the patient was set-up in a stable reproducible supine position for radiation therapy.  A vacuum lock pillow device was custom fabricated to position his legs in a reproducible immobilized position.  Then, I performed a urethrogram under sterile conditions to identify the prostatic apex.  CT images were obtained.  Surface markings were placed.  The CT images were loaded into the planning software.  Then the prostate target and avoidance structures including the rectum, bladder, bowel and hips were contoured.  Treatment planning then occurred.  The radiation prescription was entered and confirmed.  A total of one complex treatment devices was fabricated. I have requested : Intensity Modulated Radiotherapy (IMRT) is medically necessary for this case for the following reason:  Rectal sparing.Marland Kitchen  PLAN:  The patient will receive 70 Gy in 28 fractions.  ________________________________  Sheral Apley Tammi Klippel, M.D.

## 2020-11-23 NOTE — Progress Notes (Signed)
Called pt no answer, left vm  

## 2020-11-24 DIAGNOSIS — C61 Malignant neoplasm of prostate: Secondary | ICD-10-CM | POA: Insufficient documentation

## 2020-11-27 NOTE — Progress Notes (Signed)
Called pt to inform him about his echo results. Pt understood

## 2020-12-01 ENCOUNTER — Ambulatory Visit
Admission: RE | Admit: 2020-12-01 | Discharge: 2020-12-01 | Disposition: A | Discharge status: Home / Self Care | Payer: 59 | Source: Ambulatory Visit | Attending: Radiation Oncology | Admitting: Radiation Oncology

## 2020-12-01 ENCOUNTER — Encounter: Payer: Self-pay | Admitting: Medical Oncology

## 2020-12-01 DIAGNOSIS — C61 Malignant neoplasm of prostate: Secondary | ICD-10-CM

## 2020-12-02 ENCOUNTER — Ambulatory Visit
Admission: RE | Admit: 2020-12-02 | Discharge: 2020-12-02 | Disposition: A | Discharge status: Home / Self Care | Payer: 59 | Source: Ambulatory Visit | Attending: Radiation Oncology | Admitting: Radiation Oncology

## 2020-12-02 ENCOUNTER — Other Ambulatory Visit: Payer: Self-pay

## 2020-12-02 DIAGNOSIS — C61 Malignant neoplasm of prostate: Secondary | ICD-10-CM | POA: Diagnosis not present

## 2020-12-03 ENCOUNTER — Telehealth: Payer: Self-pay | Admitting: Internal Medicine

## 2020-12-03 ENCOUNTER — Ambulatory Visit
Admission: RE | Admit: 2020-12-03 | Discharge: 2020-12-03 | Disposition: A | Discharge status: Home / Self Care | Payer: 59 | Source: Ambulatory Visit | Attending: Radiation Oncology | Admitting: Radiation Oncology

## 2020-12-03 ENCOUNTER — Other Ambulatory Visit: Payer: Self-pay

## 2020-12-03 DIAGNOSIS — C61 Malignant neoplasm of prostate: Secondary | ICD-10-CM | POA: Diagnosis not present

## 2020-12-03 NOTE — Telephone Encounter (Signed)
   RASHID WHITENIGHT DOB: [birth year] MRN: [id number]   RIDER WAIVER AND RELEASE OF LIABILITY  For purposes of improving physical access to our facilities, [facility] is pleased to partner with third parties to provide [facility] patients or other authorized individuals the option of convenient, on-demand ground transportation services (the [employer] bre[employer]") through use of the technology service that enables users to request on-demand ground transportation from independent third-party providers.  By opting to use and accept these [employer], I, the undersigned, hereby agree on behalf of myself, and on behalf of any minor child using the [employer] for whom I am the parent or legal guardian, as follows:  1. Government social research officer provided to me are provided by independent third-party transportation providers who are not [employer] or employees and who are unaffiliated with [employer]. 2. [facility] is neither a transportation carrier nor a common or public carrier. 3. [facility] has no control over the quality or safety of the transportation that occurs as a result of the [employer]. 4. [facility] cannot guarantee that any third-party transportation provider will complete any arranged transportation service. 5. [facility] makes no representation, warranty, or guarantee regarding the reliability, timeliness, quality, safety, suitability, or availability of any of the Transport Services or that they will be error free. 6. I fully understand that traveling by vehicle involves risks and dangers of serious bodily injury, including permanent disability, paralysis, and death. I agree, on behalf of myself and on behalf of any minor child using the Transport Services for whom I am the parent or legal guardian, that the entire risk arising out of my use of the [employer] remains solely with me, to the maximum extent permitted under applicable law. 7. The [employer] are provided "as is" and "as available." [facility] disclaims all representations and warranties, express, implied or statutory, not expressly set out in these terms, including the implied warranties of merchantability and fitness for a particular purpose. 8. I hereby waive and release [facility], its agents, employees, officers, directors, representatives, insurers, attorneys, assigns, successors, subsidiaries, and affiliates from any and all past, present, or future claims, demands, liabilities, actions, causes of action, or suits of any kind directly or indirectly arising from acceptance and use of the [employer]. 9. I further waive and release [facility] and its affiliates from all present and future liability and responsibility for any injury or death to persons or damages to property caused by or related to the use of the [employer]. 10. I have read this Waiver and Release of Liability, and I understand the terms used in it and their legal significance. This Waiver is freely and voluntarily given with the understanding that my right (as well as the right of any minor child for whom I am the parent or legal guardian using the [employer]) to legal recourse against [facility] in connection with the [employer] is knowingly surrendered in return for use of these services.   I attest that I read the consent document to [name], gave Mr. Merlos the opportunity to ask questions and answered the questions asked (if any). I affirm that [name] then provided consent for he's participation in this program.     Legrand Pitts

## 2020-12-04 ENCOUNTER — Ambulatory Visit
Admission: RE | Admit: 2020-12-04 | Discharge: 2020-12-04 | Disposition: A | Discharge status: Home / Self Care | Payer: 59 | Source: Ambulatory Visit | Attending: Radiation Oncology | Admitting: Radiation Oncology

## 2020-12-04 DIAGNOSIS — C61 Malignant neoplasm of prostate: Secondary | ICD-10-CM | POA: Diagnosis not present

## 2020-12-07 ENCOUNTER — Ambulatory Visit
Admission: RE | Admit: 2020-12-07 | Discharge: 2020-12-07 | Disposition: A | Discharge status: Home / Self Care | Payer: 59 | Source: Ambulatory Visit | Attending: Radiation Oncology | Admitting: Radiation Oncology

## 2020-12-07 DIAGNOSIS — C61 Malignant neoplasm of prostate: Secondary | ICD-10-CM | POA: Diagnosis not present

## 2020-12-08 ENCOUNTER — Ambulatory Visit
Admission: RE | Admit: 2020-12-08 | Discharge: 2020-12-08 | Disposition: A | Discharge status: Home / Self Care | Payer: 59 | Source: Ambulatory Visit | Attending: Radiation Oncology | Admitting: Radiation Oncology

## 2020-12-08 ENCOUNTER — Other Ambulatory Visit: Payer: Self-pay

## 2020-12-08 DIAGNOSIS — C61 Malignant neoplasm of prostate: Secondary | ICD-10-CM | POA: Diagnosis not present

## 2020-12-09 ENCOUNTER — Ambulatory Visit
Admission: RE | Admit: 2020-12-09 | Discharge: 2020-12-09 | Disposition: A | Discharge status: Home / Self Care | Payer: 59 | Source: Ambulatory Visit | Attending: Radiation Oncology | Admitting: Radiation Oncology

## 2020-12-09 ENCOUNTER — Other Ambulatory Visit: Payer: Self-pay

## 2020-12-09 DIAGNOSIS — C61 Malignant neoplasm of prostate: Secondary | ICD-10-CM | POA: Diagnosis not present

## 2020-12-10 ENCOUNTER — Ambulatory Visit
Admission: RE | Admit: 2020-12-10 | Discharge: 2020-12-10 | Disposition: A | Discharge status: Home / Self Care | Payer: 59 | Source: Ambulatory Visit | Attending: Radiation Oncology | Admitting: Radiation Oncology

## 2020-12-10 DIAGNOSIS — C61 Malignant neoplasm of prostate: Secondary | ICD-10-CM | POA: Diagnosis not present

## 2020-12-11 ENCOUNTER — Other Ambulatory Visit: Payer: Self-pay

## 2020-12-11 ENCOUNTER — Ambulatory Visit
Admission: RE | Admit: 2020-12-11 | Discharge: 2020-12-11 | Disposition: A | Discharge status: Home / Self Care | Payer: 59 | Source: Ambulatory Visit | Attending: Radiation Oncology | Admitting: Radiation Oncology

## 2020-12-11 DIAGNOSIS — C61 Malignant neoplasm of prostate: Secondary | ICD-10-CM | POA: Diagnosis not present

## 2020-12-14 ENCOUNTER — Ambulatory Visit
Admission: RE | Admit: 2020-12-14 | Discharge: 2020-12-14 | Disposition: A | Discharge status: Home / Self Care | Payer: 59 | Source: Ambulatory Visit | Attending: Radiation Oncology | Admitting: Radiation Oncology

## 2020-12-14 ENCOUNTER — Other Ambulatory Visit: Payer: Self-pay | Admitting: Internal Medicine

## 2020-12-14 DIAGNOSIS — I10 Essential (primary) hypertension: Secondary | ICD-10-CM

## 2020-12-14 DIAGNOSIS — C61 Malignant neoplasm of prostate: Secondary | ICD-10-CM | POA: Diagnosis not present

## 2020-12-15 ENCOUNTER — Other Ambulatory Visit: Payer: Self-pay

## 2020-12-15 ENCOUNTER — Ambulatory Visit
Admission: RE | Admit: 2020-12-15 | Discharge: 2020-12-15 | Disposition: A | Discharge status: Home / Self Care | Payer: 59 | Source: Ambulatory Visit | Attending: Radiation Oncology | Admitting: Radiation Oncology

## 2020-12-15 DIAGNOSIS — C61 Malignant neoplasm of prostate: Secondary | ICD-10-CM | POA: Diagnosis not present

## 2020-12-16 ENCOUNTER — Other Ambulatory Visit: Payer: Self-pay

## 2020-12-16 ENCOUNTER — Ambulatory Visit
Admission: RE | Admit: 2020-12-16 | Discharge: 2020-12-16 | Disposition: A | Discharge status: Home / Self Care | Payer: 59 | Source: Ambulatory Visit | Attending: Radiation Oncology | Admitting: Radiation Oncology

## 2020-12-16 DIAGNOSIS — C61 Malignant neoplasm of prostate: Secondary | ICD-10-CM | POA: Diagnosis not present

## 2020-12-17 ENCOUNTER — Ambulatory Visit
Admission: RE | Admit: 2020-12-17 | Discharge: 2020-12-17 | Disposition: A | Discharge status: Home / Self Care | Payer: 59 | Source: Ambulatory Visit | Attending: Radiation Oncology | Admitting: Radiation Oncology

## 2020-12-17 DIAGNOSIS — C61 Malignant neoplasm of prostate: Secondary | ICD-10-CM | POA: Diagnosis not present

## 2020-12-18 ENCOUNTER — Ambulatory Visit
Admission: RE | Admit: 2020-12-18 | Discharge: 2020-12-18 | Disposition: A | Discharge status: Home / Self Care | Payer: 59 | Source: Ambulatory Visit | Attending: Radiation Oncology | Admitting: Radiation Oncology

## 2020-12-18 DIAGNOSIS — C61 Malignant neoplasm of prostate: Secondary | ICD-10-CM | POA: Diagnosis not present

## 2020-12-22 ENCOUNTER — Ambulatory Visit
Admission: RE | Admit: 2020-12-22 | Discharge: 2020-12-22 | Disposition: A | Discharge status: Home / Self Care | Payer: 59 | Source: Ambulatory Visit | Attending: Radiation Oncology | Admitting: Radiation Oncology

## 2020-12-22 ENCOUNTER — Other Ambulatory Visit: Payer: Self-pay

## 2020-12-22 ENCOUNTER — Encounter: Payer: Self-pay | Admitting: Medical Oncology

## 2020-12-22 DIAGNOSIS — C61 Malignant neoplasm of prostate: Secondary | ICD-10-CM | POA: Diagnosis not present

## 2020-12-23 ENCOUNTER — Ambulatory Visit
Admission: RE | Admit: 2020-12-23 | Discharge: 2020-12-23 | Disposition: A | Discharge status: Home / Self Care | Payer: 59 | Source: Ambulatory Visit | Attending: Radiation Oncology | Admitting: Radiation Oncology

## 2020-12-23 DIAGNOSIS — C61 Malignant neoplasm of prostate: Secondary | ICD-10-CM | POA: Insufficient documentation

## 2020-12-24 ENCOUNTER — Other Ambulatory Visit: Payer: Self-pay

## 2020-12-24 ENCOUNTER — Ambulatory Visit
Admission: RE | Admit: 2020-12-24 | Discharge: 2020-12-24 | Disposition: A | Discharge status: Home / Self Care | Payer: 59 | Source: Ambulatory Visit | Attending: Radiation Oncology | Admitting: Radiation Oncology

## 2020-12-24 DIAGNOSIS — C61 Malignant neoplasm of prostate: Secondary | ICD-10-CM | POA: Diagnosis not present

## 2020-12-25 ENCOUNTER — Ambulatory Visit
Admission: RE | Admit: 2020-12-25 | Discharge: 2020-12-25 | Disposition: A | Discharge status: Home / Self Care | Payer: 59 | Source: Ambulatory Visit | Attending: Radiation Oncology | Admitting: Radiation Oncology

## 2020-12-25 DIAGNOSIS — C61 Malignant neoplasm of prostate: Secondary | ICD-10-CM | POA: Diagnosis not present

## 2020-12-28 ENCOUNTER — Ambulatory Visit
Admission: RE | Admit: 2020-12-28 | Discharge: 2020-12-28 | Disposition: A | Discharge status: Home / Self Care | Payer: 59 | Source: Ambulatory Visit | Attending: Radiation Oncology | Admitting: Radiation Oncology

## 2020-12-28 ENCOUNTER — Other Ambulatory Visit: Payer: Self-pay

## 2020-12-28 DIAGNOSIS — C61 Malignant neoplasm of prostate: Secondary | ICD-10-CM | POA: Diagnosis not present

## 2020-12-28 NOTE — Progress Notes (Signed)
Received patient in the clinic following radiation treatment. Weight stable. Systolic bp slightly elevated. Patient explains that over the weekend he has been unable to empty his bladder. Patient reports getting the sensation to void then having to strain to the point of sweating and "popping out hemorrhoids" to pass a very small amount of urine. Patient's prostate volume at consult was 34 cc. Patient has a history of bilateral kidney stones. Patient endorses taking Flomax bid as directed. Patient reports his symptoms persist even with Flomax on board. Patient reports dysuria. Patient reports urinary frequency and an inability to empty his bladder. Patient is a mutual patient of Dr. Tammi Klippel and Dr. Jeffie Pollock. Phoned Jenny Reichmann, triage nurse, at [facility]. Obtained a 1030 appointment for the patient at [facility] with the on call physician to rule out rentention vs obstruction. Patient instructed to present to [facility] immediately. Patient verbalized understanding of all reviewed. Patient left the clinic ambulatory in no distress.  BP (!) 142/86   Pulse 69   Temp (!) 97.1 F (36.2 C)   Resp (!) 24   Ht 5\' 5"  (1.651 m)   Wt 233 lb 6.4 oz (105.9 kg)   SpO2 100%   BMI 38.84 kg/m  Wt Readings from Last 3 Encounters:  12/28/20 233 lb 6.4 oz (105.9 kg)  11/17/20 239 lb 1.6 oz (108.5 kg)  10/30/20 228 lb 9.6 oz (103.7 kg)

## 2020-12-29 ENCOUNTER — Ambulatory Visit
Admission: RE | Admit: 2020-12-29 | Discharge: 2020-12-29 | Disposition: A | Discharge status: Home / Self Care | Payer: 59 | Source: Ambulatory Visit | Attending: Radiation Oncology | Admitting: Radiation Oncology

## 2020-12-29 DIAGNOSIS — C61 Malignant neoplasm of prostate: Secondary | ICD-10-CM | POA: Diagnosis not present

## 2020-12-29 NOTE — Progress Notes (Signed)
Patient presented to nursing following his radiation treatment today. Patient expressed appreciation to the nurse for her assistance yesterday. Patient explains his LUTS have greatly improved since his evaluation at [facility] yesterday. Patient reports taking tamsulosin and oxybutnin as directed yesterday. Instructed patient to hydrate and be careful shifting from a laying to sitting/standing position since both medications can lower his bp. Patient verbalized understanding. Patient left clinic ambulatory without distress.

## 2020-12-30 ENCOUNTER — Other Ambulatory Visit: Payer: Self-pay

## 2020-12-30 ENCOUNTER — Encounter (HOSPITAL_COMMUNITY): Payer: Self-pay

## 2020-12-30 ENCOUNTER — Ambulatory Visit
Admission: RE | Admit: 2020-12-30 | Discharge: 2020-12-30 | Disposition: A | Discharge status: Home / Self Care | Payer: 59 | Source: Ambulatory Visit | Attending: Radiation Oncology | Admitting: Radiation Oncology

## 2020-12-30 ENCOUNTER — Emergency Department (HOSPITAL_COMMUNITY)
Admission: EM | Admit: 2020-12-30 | Discharge: 2020-12-31 | Disposition: A | Discharge status: Home / Self Care | Payer: 59 | Attending: Emergency Medicine | Admitting: Emergency Medicine

## 2020-12-30 DIAGNOSIS — E1140 Type 2 diabetes mellitus with diabetic neuropathy, unspecified: Secondary | ICD-10-CM | POA: Diagnosis not present

## 2020-12-30 DIAGNOSIS — Z79899 Other long term (current) drug therapy: Secondary | ICD-10-CM | POA: Insufficient documentation

## 2020-12-30 DIAGNOSIS — R350 Frequency of micturition: Secondary | ICD-10-CM | POA: Diagnosis present

## 2020-12-30 DIAGNOSIS — I10 Essential (primary) hypertension: Secondary | ICD-10-CM | POA: Insufficient documentation

## 2020-12-30 DIAGNOSIS — Z8546 Personal history of malignant neoplasm of prostate: Secondary | ICD-10-CM | POA: Diagnosis not present

## 2020-12-30 DIAGNOSIS — Z96653 Presence of artificial knee joint, bilateral: Secondary | ICD-10-CM | POA: Insufficient documentation

## 2020-12-30 DIAGNOSIS — F17210 Nicotine dependence, cigarettes, uncomplicated: Secondary | ICD-10-CM | POA: Insufficient documentation

## 2020-12-30 DIAGNOSIS — Z7984 Long term (current) use of oral hypoglycemic drugs: Secondary | ICD-10-CM | POA: Insufficient documentation

## 2020-12-30 DIAGNOSIS — R339 Retention of urine, unspecified: Secondary | ICD-10-CM | POA: Diagnosis not present

## 2020-12-30 LAB — COMPREHENSIVE METABOLIC PANEL
ALT: 35 U/L (ref 0–44)
AST: 36 U/L (ref 15–41)
Albumin: 4.1 g/dL (ref 3.5–5.0)
Alkaline Phosphatase: 90 U/L (ref 38–126)
Anion gap: 7 (ref 5–15)
BUN: 17 mg/dL (ref 8–23)
CO2: 27 mmol/L (ref 22–32)
Calcium: 9.7 mg/dL (ref 8.9–10.3)
Chloride: 104 mmol/L (ref 98–111)
Creatinine, Ser: 0.59 mg/dL — ABNORMAL LOW (ref 0.61–1.24)
GFR, Estimated: >60 mL/min (ref >60)
Glucose, Bld: 147 mg/dL — ABNORMAL HIGH (ref 70–99)
Potassium: 3.6 mmol/L (ref 3.5–5.1)
Sodium: 138 mmol/L (ref 135–145)
Total Bilirubin: 0.7 mg/dL (ref 0.3–1.2)
Total Protein: 7.4 g/dL (ref 6.5–8.1)

## 2020-12-30 LAB — URINALYSIS, ROUTINE W REFLEX MICROSCOPIC
Bilirubin Urine: NEGATIVE
Glucose, UA: NEGATIVE mg/dL
Hgb urine dipstick: NEGATIVE
Ketones, ur: NEGATIVE mg/dL
Leukocytes,Ua: NEGATIVE
Nitrite: NEGATIVE
Protein, ur: NEGATIVE mg/dL
Specific Gravity, Urine: 1.017 (ref 1.005–1.030)
pH: 6 (ref 5.0–8.0)

## 2020-12-30 LAB — CBC WITH DIFFERENTIAL/PLATELET
Abs Immature Granulocytes: 0.02 10*3/uL (ref 0.00–0.07)
Basophils Absolute: 0 10*3/uL (ref 0.0–0.1)
Basophils Relative: 1 %
Eosinophils Absolute: 0.3 10*3/uL (ref 0.0–0.5)
Eosinophils Relative: 5 %
HCT: 33.8 % — ABNORMAL LOW (ref 39.0–52.0)
Hemoglobin: 11.4 g/dL — ABNORMAL LOW (ref 13.0–17.0)
Immature Granulocytes: 0 %
Lymphocytes Relative: 19 %
Lymphs Abs: 1.3 10*3/uL (ref 0.7–4.0)
MCH: 30.8 pg (ref 26.0–34.0)
MCHC: 33.7 g/dL (ref 30.0–36.0)
MCV: 91.4 fL (ref 80.0–100.0)
Monocytes Absolute: 0.5 10*3/uL (ref 0.1–1.0)
Monocytes Relative: 8 %
Neutro Abs: 4.9 10*3/uL (ref 1.7–7.7)
Neutrophils Relative %: 67 %
Platelets: 209 10*3/uL (ref 150–400)
RBC: 3.7 MIL/uL — ABNORMAL LOW (ref 4.22–5.81)
RDW: 13.9 % (ref 11.5–15.5)
WBC: 7.1 10*3/uL (ref 4.0–10.5)
nRBC: 0 % (ref 0.0–0.2)

## 2020-12-30 NOTE — ED Notes (Signed)
Pt refusing vital signs because he is refusing to leave. Agricultural consultant notified.

## 2020-12-30 NOTE — ED Triage Notes (Signed)
Pt presents via EMS, c/o urinary retention since Friday. Pt states he is able to urinate some but not much is coming out. Pt has hx prostate cancer and receives radiation Monday-Friday. Saw urology over the weekend and was told to come here since he was still having problems. Denies hematuria or fevers

## 2020-12-30 NOTE — ED Notes (Signed)
Nurse attempted to discharge pt from the ED. Nurse explained that pt is ambulatory and does not meet PTAR criteria. Nurse explained pt would need to call a ride. Nurse asked if they could contact his sister or bother (who are in the chart). Pt refused, stating it was the hospital's responsibility to transport him home. Nurse spoke to charge RN who said pt could not be offered cab voucher. When nurse relayed this information to the pt. Pt became verbally abusive and said "call the police, because I'm not going anywhere." Security notified.

## 2020-12-30 NOTE — Discharge Instructions (Addendum)
Continue taking your Flomax.  Keep the catheter in place.  Follow-up with your urologist for further evaluation

## 2020-12-30 NOTE — ED Notes (Signed)
Security contacted PD about pt's refusal to vacate the ED despite discharged status.

## 2020-12-30 NOTE — ED Provider Notes (Signed)
Emergency Medicine Provider Triage Evaluation Note  Bradley Hull , a 70 y.o. male  was evaluated in triage.  Pt complains of urinary retention x5 days currently undergoing prostate chemo and radiation.  Was evaluated by urology for the same without cause found per patient.  Review of Systems  Positive: Urinary urgency Negative: Abdominal pain, flank pain, fever, chills, dysuria, gross hematuria  Physical Exam  BP (!) 161/93   Pulse 76   Temp 98.6 F (37 C)   Resp 20   SpO2 100%  Gen:   Awake, no distress   Resp:  Normal effort  MSK:   Moves extremities without difficulty  Other:  No abdominal tenderness on exam  Medical Decision Making  Medically screening exam initiated at 7:38 PM.  Appropriate orders placed.  Bradley Hull was informed that the remainder of the evaluation will be completed by another provider, this initial triage assessment does not replace that evaluation, and the importance of remaining in the ED until their evaluation is complete.  Basic labs and UA ordered.    Portions of this note were generated with [employer]. Dictation errors may occur despite best attempts at proofreading.    Barrie Folk, PA-C 12/30/20 Lattie Corns    Dorie Rank, MD 12/31/20 [phone]

## 2020-12-30 NOTE — ED Provider Notes (Signed)
[facility] DEPT Provider Note   CSN: [id number] Arrival date & time: 12/30/20  1901     History Chief Complaint  Patient presents with  . Urinary Retention    Bradley Hull is a 70 y.o. male.  HPI   Patient presented to the ED for evaluation of difficulty urinating.  Patient states he has not been able to urinate properly for the last several days.  Patient does have history of prostate cancer and is undergoing chemotherapy and radiation.  Patient states he is urinating frequently and does not feel like he is emptying his bladder properly.  He denies any fevers or chills.  Past Medical History:  Diagnosis Date  . Arthritis   . Benign prostatic hyperplasia   . Cirrhosis ([place])   . Colon polyps   . Complication of anesthesia    bp droppped and heart rate sped up after 09-09-2020 endoscopy /colonscopy  . DM type 2 (diabetes mellitus, type 2) ([place])   . Hepatitis C    took treatment for 2018 issue resolved per pt  . History of kidney stones   . Hypertension   . Nicotine addiction   . Obesity   . Osteoarthritis of both knees   . Prostate cancer ([place])   . Renal lithiasis     Patient Active Problem List   Diagnosis Date Noted  . Gynecomastia, male 11/13/2020  . Essential hypertension 11/08/2020  . Malignant neoplasm of prostate ([place]) 10/30/2020  . Encounter for general adult medical examination with abnormal findings 09/30/2020  . Abnormal EKG 09/29/2020  . Hyperlipidemia with target LDL less than 100 09/29/2020  . Mass of right breast 09/29/2020  . History of hepatitis C 03/27/2020  . Other cirrhosis of liver ([place]) 03/27/2020  . Type 2 diabetes mellitus, uncontrolled, with neuropathy ([place]) 03/27/2020  . Hyperlipidemia associated with type 2 diabetes mellitus ([place]) 03/27/2020  . Benign prostatic hyperplasia with lower urinary tract symptoms 03/27/2020  . Vesicular rash 03/27/2020  . Full code status 03/27/2020  . Alcohol abuse 12/02/2013   . Obesity 12/02/2013  . Chronic hepatitis C ([place]) 11/14/2013    Past Surgical History:  Procedure Laterality Date  . COLONOSCOPY  09/09/2020   [facility]  . CYSTOSCOPY  11/17/2020   Procedure: CYSTOSCOPY FLEXIBLE;  Surgeon: Irine Seal, MD;  Location: [facility];  Service: Urology;;  . ESOPHAGOGASTRODUODENOSCOPY  2016  . KIDNEY STONE SURGERY Right yrs ago  . LITHOTRIPSY Left 1986  . MULTIPLE EXTRACTIONS WITH ALVEOLOPLASTY N/A 09/09/2016   Procedure: MULTIPLE EXTRACTION WITH ALVEOLOPLASTY - one, two, six, seven, nine, ten, eleven, sixteen, 91, twenty, twenty-one, twenty-two, twenty-four, twenty-five, twenty-six, twenty-seven, twenty-eight, twenty-nine, thirty-one, thirty-two; alveolplasty; removal bilateral mandible turi; removal cyst right mandible;  bone graft;  Surgeon: [name], DDS;  Location: [facility];  Service: Oral S  . PILONIDAL CYST EXCISION  yrs ago   from spine  . SPACE OAR INSTILLATION N/A 11/17/2020   Procedure: SPACE OAR INSTILLATION FIDUCIAL MARKER;  Surgeon: Irine Seal, MD;  Location: [facility];  Service: Urology;  Laterality: N/A;  prostate  . TOTAL KNEE ARTHROPLASTY Bilateral 03/2020  . UPPER GASTROINTESTINAL ENDOSCOPY  08/30/2020   [facility]   . WISDOM TOOTH EXTRACTION     x4 removed       Family History  Problem Relation Age of Onset  . Lung cancer Mother        mets  . Diabetes Mother   . Breast cancer Mother   . Diabetes  Father   . Hyperlipidemia Father   . Colon cancer Father   . Diabetes Sister   . Hepatitis Brother   . Schizophrenia Brother   . Diabetes Sister   . Diabetes Sister   . Diabetes Brother   . Diabetes Brother   . Diabetes Maternal Grandmother   . Esophageal cancer Neg Hx   . Rectal cancer Neg Hx   . Stomach cancer Neg Hx     Social History   Tobacco Use  . Smoking status: Current Some Day Smoker    Packs/day: 0.25    Years: 51.00    Pack years: 12.75    Types: Cigarettes  . Smokeless  tobacco: Never Used  . Tobacco comment: 1 pack lasts a week  Vaping Use  . Vaping Use: Never used  Substance Use Topics  . Alcohol use: Yes    Comment: occ  . Drug use: No    Home Medications Prior to Admission medications   Medication Sig Start Date End Date Taking? Authorizing Provider  amLODipine (NORVASC) 10 MG tablet Take 1 tablet (10 mg total) by mouth daily. 09/29/20   [name], MD  carvedilol (COREG) 6.25 MG tablet Take 1 tablet (6.25 mg total) by mouth 2 (two) times daily with a meal. 09/29/20   [name], MD  indapamide (LOZOL) 1.25 MG tablet TAKE ONE TABLET BY MOUTH DAILY 12/14/20   [name], MD  metFORMIN (GLUCOPHAGE-XR) 500 MG 24 hr tablet Take 1 tablet (500 mg total) by mouth daily with breakfast. 09/29/20   [name], MD  omeprazole (PRILOSEC) 40 MG capsule Use Prilosec (omeprazole) 40 mg PO BID for 6 weeks to promote mucosal healing, then reduce to 40 mg daily and continue to titrate to lowest effective dose if abdominal symptoms resolved. Patient taking differently: as needed. Use Prilosec (omeprazole) 40 mg PO BID for 6 weeks to promote mucosal healing, then reduce to 40 mg daily and continue to titrate to lowest effective dose if abdominal symptoms resolved. 09/10/20   Cirigliano, Vito V, DO  rosuvastatin (CRESTOR) 10 MG tablet Take 1 tablet (10 mg total) by mouth daily. 09/29/20   [name], MD  tamsulosin (FLOMAX) 0.4 MG CAPS capsule Take 1 capsule (0.4 mg total) by mouth daily. Patient taking differently: Take 0.4 mg by mouth 2 (two) times daily. 09/29/20   [name], MD    Allergies    Dilaudid [hydromorphone hcl]  Review of Systems   Review of Systems  Genitourinary:       Occasional blood in stool that he attributes to his hemorrhoids, he has been having to strain, recent GI work-up that was negative per patient  All other systems reviewed and are negative.   Physical Exam Updated Vital Signs BP (!) 147/80   Pulse 62   Temp  98.6 F (37 C)   Resp 20   SpO2 98%   Physical Exam Vitals and nursing note reviewed.  Constitutional:      General: He is not in acute distress.    Appearance: He is well-developed.  HENT:     Head: Normocephalic and atraumatic.     Right Ear: External ear normal.     Left Ear: External ear normal.  Eyes:     General: No scleral icterus.       Right eye: No discharge.        Left eye: No discharge.     Conjunctiva/sclera: Conjunctivae normal.  Neck:  Trachea: No tracheal deviation.  Cardiovascular:     Rate and Rhythm: Normal rate and regular rhythm.  Pulmonary:     Effort: Pulmonary effort is normal. No respiratory distress.     Breath sounds: Normal breath sounds. No stridor. No wheezing or rales.  Abdominal:     General: Bowel sounds are normal. There is no distension.     Palpations: Abdomen is soft.     Tenderness: There is no guarding or rebound.     Comments: Mild tenderness, fullness in the suprapubic region  Musculoskeletal:        General: No tenderness.     Cervical back: Neck supple.  Skin:    General: Skin is warm and dry.     Findings: No rash.  Neurological:     Mental Status: He is alert.     Cranial Nerves: No cranial nerve deficit (no facial droop, extraocular movements intact, no slurred speech).     Sensory: No sensory deficit.     Motor: No abnormal muscle tone or seizure activity.     Coordination: Coordination normal.     ED Results / Procedures / Treatments   Labs (all labs ordered are listed, but only abnormal results are displayed) Labs Reviewed  COMPREHENSIVE METABOLIC PANEL - Abnormal; Notable for the following components:      Result Value   Glucose, Bld 147 (*)    Creatinine, Ser 0.59 (*)    All other components within normal limits  CBC WITH DIFFERENTIAL/PLATELET - Abnormal; Notable for the following components:   RBC 3.70 (*)    Hemoglobin 11.4 (*)    HCT 33.8 (*)    All other components within normal limits  URINE  CULTURE  URINALYSIS, ROUTINE W REFLEX MICROSCOPIC    EKG None  Radiology No results found.  Procedures Procedures   Medications Ordered in ED Medications - No data to display  ED Course  I have reviewed the triage vital signs and the nursing notes.  Pertinent labs & imaging results that were available during my care of the patient were reviewed by me and considered in my medical decision making (see chart for details).  Clinical Course as of 12/30/20 2308  Wed Dec 30, 2020  2200 Labs are unremarkable.  No signs of infection.  Renal function is normal.  We will proceed with Foley catheter assess for urinary retention [JK]  2304 Patient has 400 cc of urine in the Foley catheter needs continuing to drain.  This was already after he had tried to urinate earlier we will leave the Foley catheter in place as he does appear to have a component of bladder outlet obstruction [JK]    Clinical Course User Index [JK] Dorie Rank, MD   MDM Rules/Calculators/A&P                          Patient presented to the ED for evaluation of difficulty urinating.  Patient's ED work-up is reassuring.  No signs of acute infection.  No signs of renal failure.  Patient was able to urinate but he was still feeling distended.  Foley catheter was inserted and he has at least 400 cc of urine and is continuing to drain at this point.  Suspect he does have a component of bladder outlet obstruction.  Will discharge home with Foley catheter.  Follow-up with urologist. Final Clinical Impression(s) / ED Diagnoses Final diagnoses:  Urinary retention    Rx / DC Orders  ED Discharge Orders    None       Dorie Rank, MD 12/30/20 2310

## 2020-12-30 NOTE — ED Notes (Signed)
Switched pt's foley to a leg-bag at pt's request before D/C

## 2020-12-31 ENCOUNTER — Ambulatory Visit
Admission: RE | Admit: 2020-12-31 | Discharge: 2020-12-31 | Disposition: A | Discharge status: Home / Self Care | Payer: 59 | Source: Ambulatory Visit | Attending: Radiation Oncology | Admitting: Radiation Oncology

## 2020-12-31 ENCOUNTER — Ambulatory Visit (INDEPENDENT_AMBULATORY_CARE_PROVIDER_SITE_OTHER): Payer: 59 | Admitting: Internal Medicine

## 2020-12-31 ENCOUNTER — Encounter: Payer: Self-pay | Admitting: Internal Medicine

## 2020-12-31 ENCOUNTER — Other Ambulatory Visit: Payer: Self-pay | Admitting: Internal Medicine

## 2020-12-31 VITALS — BP 134/76 | HR 63 | Temp 98.5°F | Ht 65.0 in | Wt 234.0 lb

## 2020-12-31 DIAGNOSIS — C61 Malignant neoplasm of prostate: Secondary | ICD-10-CM

## 2020-12-31 DIAGNOSIS — D539 Nutritional anemia, unspecified: Secondary | ICD-10-CM | POA: Diagnosis not present

## 2020-12-31 DIAGNOSIS — IMO0002 Reserved for concepts with insufficient information to code with codable children: Secondary | ICD-10-CM

## 2020-12-31 DIAGNOSIS — I10 Essential (primary) hypertension: Secondary | ICD-10-CM

## 2020-12-31 DIAGNOSIS — E1140 Type 2 diabetes mellitus with diabetic neuropathy, unspecified: Secondary | ICD-10-CM

## 2020-12-31 DIAGNOSIS — R339 Retention of urine, unspecified: Secondary | ICD-10-CM | POA: Diagnosis not present

## 2020-12-31 DIAGNOSIS — E1165 Type 2 diabetes mellitus with hyperglycemia: Secondary | ICD-10-CM

## 2020-12-31 NOTE — Progress Notes (Signed)
Subjective:  Patient ID: Bradley Hull, male    DOB: 1950-08-18  Age: 70 y.o. MRN: [id number]  CC: Anemia, Hypertension, and Diabetes  This visit occurred during the SARS-CoV-2 public health emergency.  Safety protocols were in place, including screening questions prior to the visit, additional usage of staff PPE, and extensive cleaning of exam room while observing appropriate contact time as indicated for disinfecting solutions.    HPI Bradley Hull presents for f/up -  He is undergoing radiation therapy for the treatment of prostate cancer.  He has a catheter in place.  He denies any recent episodes of chest pain, shortness of breath, diaphoresis, dizziness, lightheadedness, or polys.  Outpatient Medications Prior to Visit  Medication Sig Dispense Refill   amLODipine (NORVASC) 10 MG tablet Take 1 tablet (10 mg total) by mouth daily. 90 tablet 1   carvedilol (COREG) 6.25 MG tablet Take 1 tablet (6.25 mg total) by mouth 2 (two) times daily with a meal. 180 tablet 1   indapamide (LOZOL) 1.25 MG tablet TAKE ONE TABLET BY MOUTH DAILY 90 tablet 0   metFORMIN (GLUCOPHAGE-XR) 500 MG 24 hr tablet Take 1 tablet (500 mg total) by mouth daily with breakfast. 90 tablet 1   omeprazole (PRILOSEC) 40 MG capsule Use Prilosec (omeprazole) 40 mg PO BID for 6 weeks to promote mucosal healing, then reduce to 40 mg daily and continue to titrate to lowest effective dose if abdominal symptoms resolved. (Patient taking differently: as needed. Use Prilosec (omeprazole) 40 mg PO BID for 6 weeks to promote mucosal healing, then reduce to 40 mg daily and continue to titrate to lowest effective dose if abdominal symptoms resolved.) 180 capsule 1   rosuvastatin (CRESTOR) 10 MG tablet Take 1 tablet (10 mg total) by mouth daily. 90 tablet 1   tamsulosin (FLOMAX) 0.4 MG CAPS capsule Take 1 capsule (0.4 mg total) by mouth daily. (Patient taking differently: Take 0.4 mg by mouth 2 (two) times daily.) 90 capsule 1   No  facility-administered medications prior to visit.    ROS Review of Systems  Constitutional:  Negative for appetite change, diaphoresis, fatigue and unexpected weight change.  HENT: Negative.    Eyes: Negative.   Respiratory:  Negative for cough, chest tightness, shortness of breath and wheezing.   Cardiovascular:  Negative for chest pain, palpitations and leg swelling.  Gastrointestinal:  Negative for abdominal pain, constipation, diarrhea, nausea and vomiting.  Genitourinary:  Positive for difficulty urinating. Negative for dysuria.  Musculoskeletal:  Negative for arthralgias and myalgias.  Skin: Negative.   Neurological: Negative.  Negative for dizziness, weakness and light-headedness.  Hematological:  Negative for adenopathy. Does not bruise/bleed easily.  Psychiatric/Behavioral: Negative.     Objective:  BP 134/76 (BP Location: Right Arm, Patient Position: Sitting, Cuff Size: Large)   Pulse 63   Temp 98.5 F (36.9 C) (Oral)   Ht 5\' 5"  (1.651 m)   Wt 234 lb (106.1 kg)   SpO2 95%   BMI 38.94 kg/m   BP Readings from Last 3 Encounters:  12/31/20 134/76  12/30/20 (!) 147/80  12/28/20 (!) 142/86    Wt Readings from Last 3 Encounters:  12/31/20 234 lb (106.1 kg)  12/28/20 233 lb 6.4 oz (105.9 kg)  11/17/20 239 lb 1.6 oz (108.5 kg)    Physical Exam Vitals reviewed.  Constitutional:      Appearance: Normal appearance.  HENT:     Nose: Nose normal.     Mouth/Throat:     Mouth:  Mucous membranes are moist.  Eyes:     Conjunctiva/sclera: Conjunctivae normal.  Cardiovascular:     Rate and Rhythm: Normal rate and regular rhythm.     Heart sounds: No murmur heard. Pulmonary:     Effort: Pulmonary effort is normal.     Breath sounds: No stridor. No wheezing, rhonchi or rales.  Abdominal:     General: Abdomen is flat. Bowel sounds are normal. There is no distension.     Palpations: Abdomen is soft. There is no hepatomegaly, splenomegaly or mass.     Tenderness: There is  no abdominal tenderness.  Musculoskeletal:        General: Normal range of motion.     Cervical back: Neck supple.     Right lower leg: No edema.     Left lower leg: No edema.  Lymphadenopathy:     Cervical: No cervical adenopathy.  Skin:    General: Skin is warm and dry.  Neurological:     General: No focal deficit present.     Mental Status: He is alert.    Lab Results  Component Value Date   WBC 7.1 12/30/2020   HGB 11.4 (L) 12/30/2020   HCT 33.8 (L) 12/30/2020   PLT 209 12/30/2020   GLUCOSE 147 (H) 12/30/2020   CHOL 131 06/03/2020   TRIG 89.0 06/03/2020   HDL 49.60 06/03/2020   LDLCALC 64 06/03/2020   ALT 35 12/30/2020   AST 36 12/30/2020   NA 138 12/30/2020   K 3.6 12/30/2020   CL 104 12/30/2020   CREATININE 0.59 (L) 12/30/2020   BUN 17 12/30/2020   CO2 27 12/30/2020   TSH 0.721 07/19/2018   PSA 2.62 07/08/2020   INR 1.2 09/09/2020   HGBA1C 6.0 (A) 09/29/2020   MICROALBUR 8.8 (H) 06/03/2020    No results found.  Assessment & Plan:   Marsh was seen today for anemia, hypertension and diabetes.  Diagnoses and all orders for this visit:  Deficiency anemia- Will screen him for vitamin deficiencies. -     CBC with Differential/Platelet; Future -     Vitamin B12; Future -     Vitamin B1; Future -     Iron; Future -     Folate; Future -     Ferritin; Future  Essential hypertension- His blood pressure is adequately well controlled. -     TSH; Future  Type 2 diabetes mellitus, uncontrolled, with neuropathy ([place])- I will monitor his A1c and will advise further. -     Hemoglobin A1c; Future  Malignant neoplasm of prostate Bradley Hull)- He is being treated with radiation therapy.  I am having Selig P. Caraher maintain his omeprazole, rosuvastatin, amLODipine, carvedilol, tamsulosin, metFORMIN, and indapamide.  No orders of the defined types were placed in this encounter.    Follow-up: No follow-ups on file.  Scarlette Calico, MD

## 2021-01-01 ENCOUNTER — Other Ambulatory Visit: Payer: Self-pay

## 2021-01-01 ENCOUNTER — Telehealth: Payer: Self-pay | Admitting: Radiation Oncology

## 2021-01-01 ENCOUNTER — Telehealth: Payer: Self-pay

## 2021-01-01 ENCOUNTER — Ambulatory Visit
Admission: RE | Admit: 2021-01-01 | Discharge: 2021-01-01 | Disposition: A | Discharge status: Home / Self Care | Payer: 59 | Source: Ambulatory Visit | Attending: Radiation Oncology | Admitting: Radiation Oncology

## 2021-01-01 DIAGNOSIS — C61 Malignant neoplasm of prostate: Secondary | ICD-10-CM | POA: Diagnosis not present

## 2021-01-01 LAB — URINE CULTURE: Culture: NO GROWTH

## 2021-01-01 NOTE — Telephone Encounter (Signed)
Forms were faxed to them earlier. See below.

## 2021-01-01 NOTE — Telephone Encounter (Signed)
PCS forms have been received.   Pt stated this is a new request for assistance in the home for Dx of prostate cancer. Radiation treatment takes a toll on him.

## 2021-01-01 NOTE — Telephone Encounter (Signed)
   Patient requesting order be sent to [facility] Fax [phone]

## 2021-01-01 NOTE — Telephone Encounter (Signed)
Consulted with Jenny Reichmann at [facility] about securing an appointment for this patient to do voiding trials. Jenny Reichmann provided an appointment for Monday, January 11, 2021 at 0800.   Phoned patient. Verbalized appointment date and time. Patient read back the correct appointment date and time then confirmed he would be present.   Patient denies any complaints related to his urinary catheter. Patient confirms his catheter is draining yellow urine without complication.   Patient verbalizes appreciation for my assistance with securing him a follow up appointment at [facility].

## 2021-01-01 NOTE — Telephone Encounter (Signed)
Forms completed.  Given to PCP to review and sign.

## 2021-01-01 NOTE — Telephone Encounter (Addendum)
Forms have been signed and faxed back.  Copy sent to scan Copy given to scan  Original has been filed  Pt has been informed.

## 2021-01-04 ENCOUNTER — Encounter: Payer: Self-pay | Admitting: Internal Medicine

## 2021-01-04 ENCOUNTER — Other Ambulatory Visit: Payer: Self-pay

## 2021-01-04 ENCOUNTER — Ambulatory Visit
Admission: RE | Admit: 2021-01-04 | Discharge: 2021-01-04 | Disposition: A | Discharge status: Home / Self Care | Payer: 59 | Source: Ambulatory Visit | Attending: Radiation Oncology | Admitting: Radiation Oncology

## 2021-01-04 DIAGNOSIS — C61 Malignant neoplasm of prostate: Secondary | ICD-10-CM | POA: Diagnosis not present

## 2021-01-04 NOTE — Patient Instructions (Signed)
[employer] medicine (25th ed., pp. 1059-1068). [place], [place]: Elsevier.">  Anemia  Anemia is a condition in which there is not enough red blood cells or hemoglobin in the blood. Hemoglobin is a substance in red blood cells thatcarries oxygen. When you do not have enough red blood cells or hemoglobin (are anemic), your body cannot get enough oxygen and your organs may not work properly. Asa result, you may feel very tired or have other problems. What are the causes? Common causes of anemia include: Excessive bleeding. Anemia can be caused by excessive bleeding inside or outside the body, including bleeding from the intestines or from heavy menstrual periods in females. Poor nutrition. Long-lasting (chronic) kidney, thyroid, and liver disease. Bone marrow disorders, spleen problems, and blood disorders. Cancer and treatments for cancer. HIV (human immunodeficiency virus) and AIDS (acquired immunodeficiency syndrome). Infections, medicines, and autoimmune disorders that destroy red blood cells. What are the signs or symptoms? Symptoms of this condition include: Minor weakness. Dizziness. Headache, or difficulties concentrating and sleeping. Heartbeats that feel irregular or faster than normal (palpitations). Shortness of breath, especially with exercise. Pale skin, lips, and nails, or cold hands and feet. Indigestion and nausea. Symptoms may occur suddenly or develop slowly. If your anemia is mild, you maynot have symptoms. How is this diagnosed? This condition is diagnosed based on blood tests, your medical history, and a physical exam. In some cases, a test may be needed in which cells are removed from the soft tissue inside of a bone and looked at under a microscope (bone marrow biopsy). Your health care provider may also check your stool (feces) for blood and may do additional testing to look for the cause of yourbleeding. Other tests may include: Imaging tests, such as a CT scan or  MRI. A procedure to see inside your esophagus and stomach (endoscopy). A procedure to see inside your colon and rectum (colonoscopy). How is this treated? Treatment for this condition depends on the cause. If you continue to lose a lot of blood, you may need to be treated at a hospital. Treatment may include: Taking supplements of iron, vitamin B12, or folic acid. Taking a hormone medicine (erythropoietin) that can help to stimulate red blood cell growth. Having a blood transfusion. This may be needed if you lose a lot of blood. Making changes to your diet. Having surgery to remove your spleen. Follow these instructions at home: Take over-the-counter and prescription medicines only as told by your health care provider. Take supplements only as told by your health care provider. Follow any diet instructions that you were given by your health care provider. Keep all follow-up visits as told by your health care provider. This is important. Contact a health care provider if: You develop new bleeding anywhere in the body. Get help right away if: You are very weak. You are short of breath. You have pain in your abdomen or chest. You are dizzy or feel faint. You have trouble concentrating. You have bloody stools, black stools, or tarry stools. You vomit repeatedly or you vomit up blood. These symptoms may represent a serious problem that is an emergency. Do not wait to see if the symptoms will go away. Get medical help right away. Call your local emergency services (911 in the [place] Do not drive yourself to the hospital. Summary Anemia is a condition in which you do not have enough red blood cells or enough of a substance in your red blood cells that carries oxygen (hemoglobin). Symptoms may occur suddenly   or develop slowly. If your anemia is mild, you may not have symptoms. This condition is diagnosed with blood tests, a medical history, and a physical exam. Other tests may be  needed. Treatment for this condition depends on the cause of the anemia. This information is not intended to replace advice given to you by your health care provider. Make sure you discuss any questions you have with your healthcare provider. Document Revised: 06/18/2019 Document Reviewed: 06/18/2019 Elsevier Patient Education  2022 [employer].

## 2021-01-04 NOTE — Progress Notes (Signed)
Received patient in the clinic follow his 24th of 28 intended radiation treatments. Patient expresses concern about fatigue, scant hematuria and particles in his urine collections bag. Explained to the patient fatigue is to be expected this far into treatment. Advised patient to increase protein in diet, take an OTC MVI, consider 30 minutes of low impact exercise, and pace himself. Explained the scant blood in his urine is most likely related to urethral irritation he has from the radiation and catheter. Finally, explained the particles he occasionally sees in his urine collection bag are most likely related to his bilateral kidney stones. Advised patient to remain well hydrated in order to keep his kidneys flushed out. Patient verbalized understanding of all reviewed. Patient exited the clinic in no distress and ambulatory.

## 2021-01-05 ENCOUNTER — Ambulatory Visit
Admission: RE | Admit: 2021-01-05 | Discharge: 2021-01-05 | Disposition: A | Discharge status: Home / Self Care | Payer: 59 | Source: Ambulatory Visit | Attending: Radiation Oncology | Admitting: Radiation Oncology

## 2021-01-05 DIAGNOSIS — C61 Malignant neoplasm of prostate: Secondary | ICD-10-CM | POA: Diagnosis not present

## 2021-01-06 ENCOUNTER — Ambulatory Visit
Admission: RE | Admit: 2021-01-06 | Discharge: 2021-01-06 | Disposition: A | Discharge status: Home / Self Care | Payer: 59 | Source: Ambulatory Visit | Attending: Radiation Oncology | Admitting: Radiation Oncology

## 2021-01-06 ENCOUNTER — Other Ambulatory Visit: Payer: Self-pay

## 2021-01-06 DIAGNOSIS — C61 Malignant neoplasm of prostate: Secondary | ICD-10-CM | POA: Diagnosis not present

## 2021-01-07 ENCOUNTER — Ambulatory Visit
Admission: RE | Admit: 2021-01-07 | Discharge: 2021-01-07 | Disposition: A | Discharge status: Home / Self Care | Payer: 59 | Source: Ambulatory Visit | Attending: Radiation Oncology | Admitting: Radiation Oncology

## 2021-01-07 DIAGNOSIS — C61 Malignant neoplasm of prostate: Secondary | ICD-10-CM | POA: Diagnosis not present

## 2021-01-08 ENCOUNTER — Encounter: Payer: Self-pay | Admitting: Urology

## 2021-01-08 ENCOUNTER — Ambulatory Visit
Admission: RE | Admit: 2021-01-08 | Discharge: 2021-01-08 | Disposition: A | Discharge status: Home / Self Care | Payer: 59 | Source: Ambulatory Visit | Attending: Radiation Oncology | Admitting: Radiation Oncology

## 2021-01-08 ENCOUNTER — Other Ambulatory Visit: Payer: Self-pay

## 2021-01-08 DIAGNOSIS — C61 Malignant neoplasm of prostate: Secondary | ICD-10-CM | POA: Diagnosis not present

## 2021-01-21 ENCOUNTER — Ambulatory Visit: Payer: 59 | Admitting: Endocrinology

## 2021-02-02 ENCOUNTER — Emergency Department (HOSPITAL_COMMUNITY)
Admission: EM | Admit: 2021-02-02 | Discharge: 2021-02-02 | Disposition: A | Discharge status: Home / Self Care | Payer: 59 | Attending: Emergency Medicine | Admitting: Emergency Medicine

## 2021-02-02 ENCOUNTER — Other Ambulatory Visit: Payer: Self-pay

## 2021-02-02 DIAGNOSIS — F17210 Nicotine dependence, cigarettes, uncomplicated: Secondary | ICD-10-CM | POA: Insufficient documentation

## 2021-02-02 DIAGNOSIS — Z8546 Personal history of malignant neoplasm of prostate: Secondary | ICD-10-CM | POA: Insufficient documentation

## 2021-02-02 DIAGNOSIS — E119 Type 2 diabetes mellitus without complications: Secondary | ICD-10-CM | POA: Insufficient documentation

## 2021-02-02 DIAGNOSIS — R338 Other retention of urine: Secondary | ICD-10-CM

## 2021-02-02 DIAGNOSIS — R3982 Chronic bladder pain: Secondary | ICD-10-CM | POA: Insufficient documentation

## 2021-02-02 DIAGNOSIS — I10 Essential (primary) hypertension: Secondary | ICD-10-CM | POA: Diagnosis not present

## 2021-02-02 DIAGNOSIS — Z79899 Other long term (current) drug therapy: Secondary | ICD-10-CM | POA: Insufficient documentation

## 2021-02-02 DIAGNOSIS — R339 Retention of urine, unspecified: Secondary | ICD-10-CM | POA: Insufficient documentation

## 2021-02-02 DIAGNOSIS — R10819 Abdominal tenderness, unspecified site: Secondary | ICD-10-CM | POA: Diagnosis not present

## 2021-02-02 DIAGNOSIS — Z96653 Presence of artificial knee joint, bilateral: Secondary | ICD-10-CM | POA: Diagnosis not present

## 2021-02-02 DIAGNOSIS — Z7984 Long term (current) use of oral hypoglycemic drugs: Secondary | ICD-10-CM | POA: Diagnosis not present

## 2021-02-02 LAB — URINALYSIS, ROUTINE W REFLEX MICROSCOPIC
Bilirubin Urine: NEGATIVE
Glucose, UA: >=500 mg/dL — AB
Ketones, ur: NEGATIVE mg/dL
Nitrite: NEGATIVE
Protein, ur: NEGATIVE mg/dL
Specific Gravity, Urine: 1.01 (ref 1.005–1.030)
pH: 6 (ref 5.0–8.0)

## 2021-02-02 LAB — URINALYSIS, MICROSCOPIC (REFLEX)
Squamous Epithelial / HPF: NONE SEEN (ref 0–5)
WBC, UA: >50 WBC/hpf (ref 0–5)

## 2021-02-02 MED ORDER — CEPHALEXIN 500 MG PO CAPS: 500.0000 mg | ORAL_CAPSULE | Freq: Two times a day (BID) | ORAL | 0 refills | Status: AC

## 2021-02-02 MED ORDER — CEPHALEXIN 250 MG PO CAPS
500.0000 mg | ORAL_CAPSULE | Freq: Once | ORAL | Status: AC
  Administered 2021-02-02: 500 mg via ORAL
  Filled 2021-02-02: qty 2

## 2021-02-02 MED ORDER — LIDOCAINE HCL URETHRAL/MUCOSAL 2 % EX GEL
1.0000 "application " | Freq: Once | CUTANEOUS | Status: AC
  Administered 2021-02-02: 1 via URETHRAL
  Filled 2021-02-02: qty 11

## 2021-02-02 MED ORDER — CEPHALEXIN 500 MG PO CAPS: 500.0000 mg | ORAL_CAPSULE | Freq: Two times a day (BID) | ORAL | 0 refills | Status: DC

## 2021-02-02 NOTE — ED Provider Notes (Signed)
Emergency Medicine Provider Triage Evaluation Note  Bradley Hull , a 70 y.o. male  was evaluated in triage.  Had foley x 3 weeks. Foley removed at Urology today. C/o sensation of bladder fullness, difficulty voiding. Had small void with EMS in transport. Notes some dysuria. CBG 469 with EMS.  Review of Systems  Positive: Difficulty voiding Negative: Fever  Physical Exam  There were no vitals taken for this visit. Gen:   Awake, no distress   Resp:  Normal effort  MSK:   Moves extremities without difficulty  Other:  Ambulatory without assistance. Obese abdomen.  Medical Decision Making  Medically screening exam initiated at 12:49 AM.  Appropriate orders placed.  Bradley Hull was informed that the remainder of the evaluation will be completed by another provider, this initial triage assessment does not replace that evaluation, and the importance of remaining in the ED until their evaluation is complete.  Difficulty voiding   Antonietta Breach, PA-C 02/02/21 9432    [name], MD 02/02/21 [phone]

## 2021-02-02 NOTE — ED Triage Notes (Signed)
Pt c/o bladder pain after catheter removed today. Catheter in place x3wks for urinary retention issues. Was advised by urologist to seek help if symptoms returned. Pt endorses painful urination, difficulty urinating  VSS 469 CBG

## 2021-02-02 NOTE — ED Notes (Signed)
Pt not responding for vital recheck.

## 2021-02-02 NOTE — Discharge Instructions (Addendum)
Please be sure to follow-up with our urology colleagues.  Return here for concerning changes in your condition.

## 2021-02-02 NOTE — ED Provider Notes (Signed)
[facility] EMERGENCY DEPARTMENT Provider Note   CSN: [id number] Arrival date & time: 02/02/21  0049     History Chief Complaint  Patient presents with   Bladder Pain   Urinary Retention    Bradley Hull is a 70 y.o. male.  HPI Patient presents with concern for minimal urine production, suprapubic discomfort. Patient has multiple medical issues including urinary retention requiring catheter which she had for the last 3 weeks, removed yesterday.  Now, since yesterday he has had multiple episodes of scant urine production with ongoing worsening urinary urge and diffuse suprapubic pain.  No other pain, no fever, no vomiting, no relief with anything.    Past Medical History:  Diagnosis Date   Arthritis    Benign prostatic hyperplasia    Cirrhosis ([place])    Colon polyps    Complication of anesthesia    bp droppped and heart rate sped up after 09-09-2020 endoscopy /colonscopy   DM type 2 (diabetes mellitus, type 2) (Bradley Hull)    Hepatitis C    took treatment for 2018 issue resolved per pt   History of kidney stones    Hypertension    Nicotine addiction    Obesity    Osteoarthritis of both knees    Prostate cancer ([name])    Renal lithiasis     Patient Active Problem List   Diagnosis Date Noted   Deficiency anemia 12/31/2020   Gynecomastia, male 11/13/2020   Essential hypertension 11/08/2020   Malignant neoplasm of prostate (Bradley Hull) 10/30/2020   Encounter for general adult medical examination with abnormal findings 09/30/2020   Abnormal EKG 09/29/2020   Hyperlipidemia with target LDL less than 100 09/29/2020   Other cirrhosis of liver (Bradley Hull) 03/27/2020   Type 2 diabetes mellitus, uncontrolled, with neuropathy (Bradley Hull) 03/27/2020   Hyperlipidemia associated with type 2 diabetes mellitus (Bradley Hull) 03/27/2020   Benign prostatic hyperplasia with lower urinary tract symptoms 03/27/2020   Full code status 03/27/2020   Alcohol abuse 12/02/2013   Obesity 12/02/2013    Chronic hepatitis C (Bradley Hull) 11/14/2013    Past Surgical History:  Procedure Laterality Date   COLONOSCOPY  09/09/2020   [facility]   CYSTOSCOPY  11/17/2020   Procedure: CYSTOSCOPY FLEXIBLE;  Surgeon: Irine Seal, MD;  Location: [name];  Service: Urology;;   ESOPHAGOGASTRODUODENOSCOPY  2016   KIDNEY STONE SURGERY Right yrs ago   LITHOTRIPSY Left 1986   MULTIPLE EXTRACTIONS WITH ALVEOLOPLASTY N/A 09/09/2016   Procedure: MULTIPLE EXTRACTION WITH ALVEOLOPLASTY - one, two, six, seven, nine, ten, eleven, 63, 2, twenty, twenty-one, twenty-two, twenty-four, twenty-five, twenty-six, twenty-seven, twenty-eight, twenty-nine, thirty-one, thirty-two; alveolplasty; removal bilateral mandible turi; removal cyst right mandible;  bone graft;  Surgeon: Bradley Hull, DDS;  Location: [name];  Service: Oral S   PILONIDAL CYST EXCISION  yrs ago   from spine   SPACE OAR INSTILLATION N/A 11/17/2020   Procedure: SPACE OAR INSTILLATION FIDUCIAL MARKER;  Surgeon: Irine Seal, MD;  Location: [facility];  Service: Urology;  Laterality: N/A;  prostate   TOTAL KNEE ARTHROPLASTY Bilateral 03/2020   UPPER GASTROINTESTINAL ENDOSCOPY  08/30/2020   [facility]    WISDOM TOOTH EXTRACTION     x4 removed       Family History  Problem Relation Age of Onset   Lung cancer Mother        mets   Diabetes Mother    Breast cancer Mother    Diabetes Father    Hyperlipidemia Father    Colon  cancer Father    Diabetes Sister    Hepatitis Brother    Schizophrenia Brother    Diabetes Sister    Diabetes Sister    Diabetes Brother    Diabetes Brother    Diabetes Maternal Grandmother    Esophageal cancer Neg Hx    Rectal cancer Neg Hx    Stomach cancer Neg Hx     Social History   Tobacco Use   Smoking status: Some Days    Packs/day: 0.25    Years: 51.00    Pack years: 12.75    Types: Cigarettes   Smokeless tobacco: Never   Tobacco comments:    1 pack lasts a week  Vaping Use    Vaping Use: Never used  Substance Use Topics   Alcohol use: Yes    Comment: occ   Drug use: No    Home Medications Prior to Admission medications   Medication Sig Start Date End Date Taking? Authorizing Provider  amLODipine (NORVASC) 10 MG tablet Take 1 tablet (10 mg total) by mouth daily. 09/29/20  Yes [name], MD  carvedilol (COREG) 6.25 MG tablet Take 1 tablet (6.25 mg total) by mouth 2 (two) times daily with a meal. 09/29/20  Yes [name], MD  indapamide (LOZOL) 1.25 MG tablet TAKE ONE TABLET BY MOUTH DAILY Patient taking differently: Take 1.25 mg by mouth daily. 12/14/20  Yes [name], MD  metFORMIN (GLUCOPHAGE-XR) 500 MG 24 hr tablet Take 1 tablet (500 mg total) by mouth daily with breakfast. 09/29/20  Yes [name], MD  rosuvastatin (CRESTOR) 10 MG tablet Take 1 tablet (10 mg total) by mouth daily. 09/29/20  Yes [name], MD  tamsulosin (FLOMAX) 0.4 MG CAPS capsule Take 1 capsule (0.4 mg total) by mouth daily. 09/29/20  Yes [name], MD  HYDROcodone-acetaminophen (NORCO/VICODIN) 5-325 MG tablet Take 1 tablet by mouth every 6 (six) hours as needed for pain. Patient not taking: Reported on 02/02/2021 01/13/21   [provider]  lidocaine (LIDODERM) 5 % Place 2 patches onto the skin daily. Patient not taking: Reported on 02/02/2021 01/13/21   [provider]  omeprazole (PRILOSEC) 40 MG capsule Use Prilosec (omeprazole) 40 mg PO BID for 6 weeks to promote mucosal healing, then reduce to 40 mg daily and continue to titrate to lowest effective dose if abdominal symptoms resolved. Patient not taking: Reported on 02/02/2021 09/10/20   Bradley Hull, Bradley Pea, DO    Allergies    Dilaudid [hydromorphone hcl]  Review of Systems   Review of Systems  Constitutional:        Per HPI, otherwise negative  HENT:         Per HPI, otherwise negative  Respiratory:         Per HPI, otherwise negative  Cardiovascular:        Per HPI, otherwise negative   Gastrointestinal:  Negative for vomiting.  Endocrine:       Negative aside from HPI  Genitourinary:        Neg aside from HPI   Musculoskeletal:        Per HPI, otherwise negative  Skin: Negative.   Neurological:  Negative for syncope.   Physical Exam Updated Vital Signs BP 130/72   Pulse 64   Temp 98.2 F (36.8 C) (Oral)   Resp 18   SpO2 98%   Physical Exam Vitals and nursing note reviewed.  Constitutional:      General: He is not  in acute distress.    Appearance: He is well-developed.  HENT:     Head: Normocephalic and atraumatic.  Eyes:     Conjunctiva/sclera: Conjunctivae normal.  Cardiovascular:     Rate and Rhythm: Normal rate and regular rhythm.  Pulmonary:     Effort: Pulmonary effort is normal. No respiratory distress.     Breath sounds: No stridor.  Abdominal:     General: There is no distension.     Tenderness: There is abdominal tenderness. There is guarding.  Skin:    General: Skin is warm and dry.  Neurological:     Mental Status: He is alert and oriented to person, place, and time.    ED Results / Procedures / Treatments   Labs (all labs ordered are listed, but only abnormal results are displayed) Labs Reviewed  URINALYSIS, ROUTINE W REFLEX MICROSCOPIC - Abnormal; Notable for the following components:      Result Value   Color, Urine GREEN (*)    APPearance HAZY (*)    Glucose, UA >=500 (*)    Hgb urine dipstick MODERATE (*)    Leukocytes,Ua MODERATE (*)    All other components within normal limits  URINALYSIS, MICROSCOPIC (REFLEX) - Abnormal; Notable for the following components:   Bacteria, UA MANY (*)    All other components within normal limits  URINE CULTURE    EKG None  Radiology No results found.  Procedures Procedures   Medications Ordered in ED Medications  cephALEXin (KEFLEX) capsule 500 mg (has no administration in time range)  lidocaine (XYLOCAINE) 2 % jelly 1 application (1 application Urethral Given by Other  02/02/21 1050)    ED Course  I have reviewed the triage vital signs and the nursing notes.  Pertinent labs & imaging results that were available during my care of the patient were reviewed by me and considered in my medical decision making (see chart for details). 2:03 PM After bladder scan demonstrated greater than 300 mL of urine patient had Foley catheter placed without complication.  Patient now in no distress, states that he feels substantially better.  Patient and I discussed plan, initiation of antibiotics given his abnormal urinalysis, close follow-up with urology.  Now given resolution of his pain, with urine production in his Foley catheter collection bag, no hemodynamic instability, suspicion for bacteremia, sepsis, patient discharged in stable condition.   MDM Rules/Calculators/A&P MDM Number of Diagnoses or Management Options Acute urinary retention: new, needed workup   Amount and/or Complexity of Data Reviewed Clinical lab tests: ordered and reviewed Tests in the radiology section of CPT: ordered and reviewed Tests in the medicine section of CPT: reviewed and ordered Decide to obtain previous medical records or to obtain history from someone other than the patient: yes Review and summarize past medical records: yes Independent visualization of images, tracings, or specimens: yes  Risk of Complications, Morbidity, and/or Mortality Presenting problems: high Diagnostic procedures: high Management options: high  Critical Care Total time providing critical care: < 30 minutes  Patient Progress Patient progress: improved   Final Clinical Impression(s) / ED Diagnoses Final diagnoses:  Acute urinary retention    Rx / DC Orders ED Discharge Orders          Ordered    cephALEXin (KEFLEX) 500 MG capsule  2 times daily        02/02/21 1404             Carmin Muskrat, MD 02/02/21 1405

## 2021-02-04 ENCOUNTER — Other Ambulatory Visit: Payer: Self-pay | Admitting: Nurse Practitioner

## 2021-02-04 ENCOUNTER — Other Ambulatory Visit (INDEPENDENT_AMBULATORY_CARE_PROVIDER_SITE_OTHER): Payer: 59

## 2021-02-04 DIAGNOSIS — E1165 Type 2 diabetes mellitus with hyperglycemia: Secondary | ICD-10-CM

## 2021-02-04 DIAGNOSIS — K7469 Other cirrhosis of liver: Secondary | ICD-10-CM

## 2021-02-04 DIAGNOSIS — D539 Nutritional anemia, unspecified: Secondary | ICD-10-CM | POA: Diagnosis not present

## 2021-02-04 DIAGNOSIS — IMO0002 Reserved for concepts with insufficient information to code with codable children: Secondary | ICD-10-CM

## 2021-02-04 DIAGNOSIS — I10 Essential (primary) hypertension: Secondary | ICD-10-CM

## 2021-02-04 DIAGNOSIS — E1140 Type 2 diabetes mellitus with diabetic neuropathy, unspecified: Secondary | ICD-10-CM | POA: Diagnosis not present

## 2021-02-04 LAB — URINE CULTURE: Culture: >=100000 — AB

## 2021-02-04 LAB — CBC WITH DIFFERENTIAL/PLATELET
Basophils Absolute: 0.1 10*3/uL (ref 0.0–0.1)
Basophils Relative: 0.7 % (ref 0.0–3.0)
Eosinophils Absolute: 0.3 10*3/uL (ref 0.0–0.7)
Eosinophils Relative: 3.9 % (ref 0.0–5.0)
HCT: 37.5 % — ABNORMAL LOW (ref 39.0–52.0)
Hemoglobin: 13 g/dL (ref 13.0–17.0)
Lymphocytes Relative: 21.6 % (ref 12.0–46.0)
Lymphs Abs: 1.8 10*3/uL (ref 0.7–4.0)
MCHC: 34.5 g/dL (ref 30.0–36.0)
MCV: 89.4 fl (ref 78.0–100.0)
Monocytes Absolute: 0.5 10*3/uL (ref 0.1–1.0)
Monocytes Relative: 6.5 % (ref 3.0–12.0)
Neutro Abs: 5.6 10*3/uL (ref 1.4–7.7)
Neutrophils Relative %: 67.3 % (ref 43.0–77.0)
Platelets: 250 10*3/uL (ref 150.0–400.0)
RBC: 4.2 Mil/uL — ABNORMAL LOW (ref 4.22–5.81)
RDW: 13.7 % (ref 11.5–15.5)
WBC: 8.4 10*3/uL (ref 4.0–10.5)

## 2021-02-04 LAB — FOLATE: Folate: >24.4 ng/mL (ref >5.9)

## 2021-02-04 LAB — VITAMIN B12: Vitamin B-12: 525 pg/mL (ref 211–911)

## 2021-02-04 LAB — TSH: TSH: 1.03 u[IU]/mL (ref 0.35–5.50)

## 2021-02-04 LAB — IRON: Iron: 67 ug/dL (ref 42–165)

## 2021-02-04 LAB — HEMOGLOBIN A1C: Hgb A1c MFr Bld: 9.5 % — ABNORMAL HIGH (ref 4.6–6.5)

## 2021-02-04 LAB — FERRITIN: Ferritin: 92.7 ng/mL (ref 22.0–322.0)

## 2021-02-05 ENCOUNTER — Telehealth: Payer: Self-pay | Admitting: Internal Medicine

## 2021-02-05 ENCOUNTER — Telehealth: Payer: Self-pay | Admitting: Emergency Medicine

## 2021-02-05 ENCOUNTER — Other Ambulatory Visit: Payer: Self-pay | Admitting: Internal Medicine

## 2021-02-05 DIAGNOSIS — IMO0002 Reserved for concepts with insufficient information to code with codable children: Secondary | ICD-10-CM

## 2021-02-05 DIAGNOSIS — E785 Hyperlipidemia, unspecified: Secondary | ICD-10-CM

## 2021-02-05 DIAGNOSIS — E1169 Type 2 diabetes mellitus with other specified complication: Secondary | ICD-10-CM

## 2021-02-05 DIAGNOSIS — E119 Type 2 diabetes mellitus without complications: Secondary | ICD-10-CM

## 2021-02-05 DIAGNOSIS — E1140 Type 2 diabetes mellitus with diabetic neuropathy, unspecified: Secondary | ICD-10-CM

## 2021-02-05 MED ORDER — SOLIQUA 100-33 UNT-MCG/ML ~~LOC~~ SOPN: 20.0000 [IU] | PEN_INJECTOR | Freq: Every day | SUBCUTANEOUS | 1 refills | Status: DC

## 2021-02-05 MED ORDER — FREESTYLE LIBRE 2 READER DEVI: 1.0000 | Freq: Every day | 5 refills | Status: DC

## 2021-02-05 MED ORDER — INSULIN PEN NEEDLE 32G X 6 MM MISC: 1.0000 | Freq: Every day | 1 refills | Status: DC

## 2021-02-05 MED ORDER — GVOKE HYPOPEN 2-PACK 1 MG/0.2ML ~~LOC~~ SOAJ: 1.0000 | Freq: Every day | SUBCUTANEOUS | 5 refills | Status: DC | PRN

## 2021-02-05 MED ORDER — FREESTYLE LIBRE 2 SENSOR MISC: 1.0000 | Freq: Every day | 5 refills | Status: DC

## 2021-02-05 NOTE — Telephone Encounter (Signed)
Post ED Visit - Positive Culture Follow-up  Culture report reviewed by antimicrobial stewardship pharmacist: [place] Team []  Elenor Quinones, Pharm.D. []  Heide Guile, Pharm.D., BCPS AQ-ID []  [place]une, Pharm.D., BCPS []  Alycia Rossetti, Pharm.D., BCPS []  [place], Pharm.D., BCPS, AAHIVP []  Legrand Como, Pharm.D., BCPS, AAHIVP [x]  Salome Arnt, PharmD, BCPS []  Johnnette Gourd, PharmD, BCPS []  Hughes Better, PharmD, BCPS []  Leeroy Cha, PharmD []  Laqueta Linden, PharmD, BCPS []  Albertina Parr, PharmD  [place] Team []  Leodis Sias, PharmD []  Lindell Spar, PharmD []  Royetta Asal, PharmD []  Graylin Shiver, Rph []  Rema Fendt) Glennon Mac, PharmD []  Arlyn Dunning, PharmD []  Netta Cedars, PharmD []  Dia Sitter, PharmD []  Leone Haven, PharmD []  Gretta Arab, PharmD []  Theodis Shove, PharmD []  Peggyann Juba, PharmD []  Reuel Boom, PharmD   Positive urine culture Treated with Cephalexin, organism sensitive to the same and no further patient follow-up is required at this time.  Sandi Raveling Shizuo Biskup 02/05/2021, 1:01 PM

## 2021-02-05 NOTE — Telephone Encounter (Signed)
   Patient is requesting a call back in regards to recent lab results

## 2021-02-05 NOTE — Telephone Encounter (Signed)
Per the result note it states that his blood sugar is too high. Will you be Rx'ing a medication to help or what is the recommended course of treatment? Please advise.

## 2021-02-08 ENCOUNTER — Telehealth: Payer: Self-pay | Admitting: Internal Medicine

## 2021-02-08 NOTE — Telephone Encounter (Signed)
True Balance was d/c by PCP Rx'd [facility] for continuous monitoring.

## 2021-02-08 NOTE — Telephone Encounter (Signed)
    Patient requesting order for True Balance  test strips  Pharmacy [facility], [place]

## 2021-02-08 NOTE — Telephone Encounter (Signed)
Called pt, LVM.   

## 2021-02-08 NOTE — Telephone Encounter (Signed)
[facility] FYI 7.16.22:  ---Caller states most recent BS 430 was like at 11:30, just rechecked and it is 361. States he has not had anything to eat today, but states he has been drinking fluids. States he is currently on ABX for UTI, has a urinary catheter. States he is having some spasms in his bladder. Denies fever. Has been on ABX for 3 days. States he had a bit of nausea, eyes feeling dry, states this happens when high sugar is high. States he is on Metformin 500 mg  Advised to call PCP now

## 2021-02-08 NOTE — Telephone Encounter (Signed)
I have reached out to the pt to discuss lab results and medication regimen as noted from PCP in the 02/05/21 telephone note. I did not connect with the pt, LVM.

## 2021-02-09 ENCOUNTER — Telehealth: Payer: Self-pay

## 2021-02-09 NOTE — Telephone Encounter (Signed)
Key: [id number]

## 2021-02-10 ENCOUNTER — Other Ambulatory Visit: Payer: Self-pay | Admitting: Internal Medicine

## 2021-02-10 ENCOUNTER — Telehealth: Payer: Self-pay

## 2021-02-10 DIAGNOSIS — E1140 Type 2 diabetes mellitus with diabetic neuropathy, unspecified: Secondary | ICD-10-CM

## 2021-02-10 DIAGNOSIS — Z794 Long term (current) use of insulin: Secondary | ICD-10-CM | POA: Insufficient documentation

## 2021-02-10 DIAGNOSIS — E119 Type 2 diabetes mellitus without complications: Secondary | ICD-10-CM | POA: Insufficient documentation

## 2021-02-10 DIAGNOSIS — IMO0002 Reserved for concepts with insufficient information to code with codable children: Secondary | ICD-10-CM

## 2021-02-10 LAB — VITAMIN B1: Vitamin B1 (Thiamine): 9 nmol/L (ref 8–30)

## 2021-02-10 MED ORDER — ACCU-CHEK AVIVA PLUS VI STRP: 1.0000 | ORAL_STRIP | Freq: Three times a day (TID) | 1 refills | Status: DC

## 2021-02-10 MED ORDER — ACCU-CHEK AVIVA PLUS W/DEVICE KIT
1.0000 | PACK | Freq: Three times a day (TID) | 2 refills | Status: DC
Start: 2021-02-10 — End: 2021-11-30

## 2021-02-10 MED ORDER — ACCU-CHEK AVIVA VI SOLN: 1.0000 | Freq: Three times a day (TID) | 2 refills | Status: DC

## 2021-02-10 NOTE — Telephone Encounter (Signed)
Pt has stated he is in need of a new Glucometer a/w supplies as he has not been able to test his blood sugar levels in months due to not having a meter or supplies.  *Sent to MA to advise.

## 2021-02-10 NOTE — Telephone Encounter (Signed)
Per CoverMyMeds: ? ?PA was denied.  ?

## 2021-03-03 ENCOUNTER — Telehealth: Payer: Self-pay | Admitting: Internal Medicine

## 2021-03-03 NOTE — Telephone Encounter (Signed)
Patient wants to discuss meds w/ provider  Would like to discuss which meds he should take in the morning & different time throughout the day  Patient is having abdominal discomfort & thinks its because hes taking all his meds at 1 time  Please callback [phone]

## 2021-03-05 ENCOUNTER — Encounter: Payer: Self-pay | Admitting: Urology

## 2021-03-05 NOTE — Progress Notes (Signed)
Patient reports no pain, dysuria, hematuria, constipation, or skin problems. Patient is experiencing some nocturia x2, with frequency, urgency, a weak urine stream and some mild diarrhea. Patient states symptoms are slowly improving and that his energy levels are good. Patient is taking his Flomax as directed.   I-PSS score of 12. Meaningful use questions complete and patient notified of his 10:00am telephone appointment on 03/10/21 and expressed an understanding of it.

## 2021-03-05 NOTE — Telephone Encounter (Signed)
Called pt, LVM to discuss, an OV would be more fitting for this conversation with PCP and to discuss abdominal discomfort.

## 2021-03-09 ENCOUNTER — Ambulatory Visit: Payer: 59 | Admitting: Nutrition

## 2021-03-10 ENCOUNTER — Ambulatory Visit
Admission: RE | Admit: 2021-03-10 | Discharge: 2021-03-10 | Disposition: A | Discharge status: Home / Self Care | Payer: 59 | Source: Ambulatory Visit | Attending: Urology | Admitting: Urology

## 2021-03-10 DIAGNOSIS — C61 Malignant neoplasm of prostate: Secondary | ICD-10-CM

## 2021-03-10 NOTE — Progress Notes (Signed)
Radiation Oncology         [phone] ________________________________  Name: FREAD KOTTKE MRN: [id number]  Date: 03/10/2021  DOB: [birth year]  Post Treatment Note  CC: [name], MD  Irine Seal, MD  Diagnosis:   70 y.o. gentleman with Stage T2a adenocarcinoma of the prostate with Gleason score of 3+4, and PSA of 2.63.  Interval Since Last Radiation:  8.5 weeks  12/01/20 - 01/08/21:  The prostate was treated to 70 Gy in 28 fractions of 2.5 Gy  Narrative:  The patient returns today for routine follow-up.  He tolerated radiation treatment relatively well with severe urinary irritation resulting in acute urinary retention in the last 2 weeks of treatment, requiring placement of indwelling Foley catheter.  He also experienced modest fatigue and occasional loose stools/diarrhea.                               On review of systems, the patient states that he is doing well in general and making progress daily regarding his LUTS.  He was able to have his Foley catheter removed approximately 2-1/2 weeks ago and has noticed gradual improvement in his LUTS since that time.  He continues taking Flomax twice daily as well as finasteride daily.  He is wearing a depends for protection due to occasional urge incontinence if he cannot make it to the bathroom quickly enough.  He specifically denies dysuria, gross hematuria, straining to void or suprapubic discomfort.  His current IPSS score is 12 with nocturia x2, frequency, urgency and a weaker flow of stream.  He also continues with mild occasional diarrhea/loose stools but this is not severe enough to require any medication.  His energy level is gradually improving as well and he has been able to remain active.  Overall, he is pleased with his progress to date.  ALLERGIES:  is allergic to dilaudid [hydromorphone hcl].  Meds: Current Outpatient Medications  Medication Sig Dispense Refill   amLODipine (NORVASC) 10 MG tablet Take 1 tablet (10 mg  total) by mouth daily. 90 tablet 1   Blood Glucose Calibration (ACCU-CHEK AVIVA) SOLN 1 Act by In Vitro route 3 (three) times daily. 1 each 2   Blood Glucose Monitoring Suppl (ACCU-CHEK AVIVA PLUS) w/Device KIT 1 Act by Does not apply route 3 (three) times daily. 1 kit 2   carvedilol (COREG) 6.25 MG tablet Take 1 tablet (6.25 mg total) by mouth 2 (two) times daily with a meal. 180 tablet 1   Glucagon (GVOKE HYPOPEN 2-PACK) 1 MG/0.2ML SOAJ Inject 1 Act into the skin daily as needed. 2 mL 5   glucose blood (ACCU-CHEK AVIVA PLUS) test strip 1 each by Other route 3 (three) times daily. 300 each 1   Insulin Glargine-Lixisenatide (SOLIQUA) 100-33 UNT-MCG/ML SOPN Inject 20 Units into the skin daily. 9 mL 1   Insulin Pen Needle 32G X 6 MM MISC 1 Act by Does not apply route daily. 100 each 1   metFORMIN (GLUCOPHAGE-XR) 500 MG 24 hr tablet Take 1 tablet (500 mg total) by mouth daily with breakfast. 90 tablet 1   tamsulosin (FLOMAX) 0.4 MG CAPS capsule Take 1 capsule (0.4 mg total) by mouth daily. 90 capsule 1   indapamide (LOZOL) 1.25 MG tablet TAKE ONE TABLET BY MOUTH DAILY (Patient not taking: Reported on 03/05/2021) 90 tablet 0   lidocaine (LIDODERM) 5 % Place 2 patches onto the skin daily. (Patient not taking: No sig  reported)     omeprazole (PRILOSEC) 40 MG capsule Use Prilosec (omeprazole) 40 mg PO BID for 6 weeks to promote mucosal healing, then reduce to 40 mg daily and continue to titrate to lowest effective dose if abdominal symptoms resolved. (Patient not taking: No sig reported) 180 capsule 1   rosuvastatin (CRESTOR) 10 MG tablet Take 1 tablet (10 mg total) by mouth daily. (Patient not taking: Reported on 03/05/2021) 90 tablet 1   No current facility-administered medications for this encounter.    Physical Findings:  vitals were not taken for this visit.  Pain Assessment Pain Score: 0-No pain/10 Unable to assess due to telephone follow-up visit format.  Lab Findings: Lab Results   Component Value Date   WBC 8.4 02/04/2021   HGB 13.0 02/04/2021   HCT 37.5 (L) 02/04/2021   MCV 89.4 02/04/2021   PLT 250.0 02/04/2021     Radiographic Findings: No results found.  Impression/Plan: 1. 70 y.o. gentleman with Stage T2a adenocarcinoma of the prostate with Gleason score of 3+4, and PSA of 2.63. He will continue to follow up with urology for ongoing PSA determinations and has an appointment scheduled for labs on 03/31/2021 and a follow-up visit with Dr. Jeffie Pollock the following week. He understands what to expect with regards to PSA monitoring going forward.  He will continue taking the Flomax twice daily and finasteride daily as prescribed.  I will look forward to following his response to treatment via correspondence with urology, and would be happy to continue to participate in his care if clinically indicated. I talked to the patient about what to expect in the future, including his risk for erectile dysfunction and rectal bleeding. I encouraged him to call or return to the office if he has any questions regarding his previous radiation or possible radiation side effects. He was comfortable with this plan and will follow up as needed.     Nicholos Johns, PA-C

## 2021-03-10 NOTE — Progress Notes (Signed)
  Radiation Oncology         [phone] ________________________________  Name: Bradley Hull MRN: [id number]  Date: 01/08/2021  DOB: Mar 27, 1951  End of Treatment Note  Diagnosis:   70 y.o. gentleman with Stage T2a adenocarcinoma of the prostate with Gleason score of 3+4, and PSA of 2.63.     Indication for treatment:  Curative, Definitive Radiotherapy       Radiation treatment dates:   12/01/20 - 01/08/21  Site/dose:   The prostate was treated to 70 Gy in 28 fractions of 2.5 Gy  Beams/energy:   The patient was treated with IMRT using volumetric arc therapy delivering 6 MV X-rays to clockwise and counterclockwise circumferential arcs with a 90 degree collimator offset to avoid dose scalloping.  Image guidance was performed with daily cone beam CT prior to each fraction to align to gold markers in the prostate and assure proper bladder and rectal fill volumes.  Immobilization was achieved with BodyFix custom mold.  Narrative: The patient tolerated radiation treatment relatively well with severe urinary irritation resulting in acute urinary retention in the last 2 weeks of treatment.  He also experienced modest fatigue and occasional loose stools/diarrhea.   Plan: The patient has completed radiation treatment. He will return to radiation oncology clinic for routine followup in one month. I advised him to call or return sooner if he has any questions or concerns related to his recovery or treatment. ________________________________  Sheral Apley. Tammi Klippel, M.D.

## 2021-03-17 ENCOUNTER — Other Ambulatory Visit: Payer: Self-pay | Admitting: Urology

## 2021-03-17 DIAGNOSIS — C61 Malignant neoplasm of prostate: Secondary | ICD-10-CM

## 2021-03-23 ENCOUNTER — Telehealth: Payer: Self-pay | Admitting: Internal Medicine

## 2021-03-23 ENCOUNTER — Other Ambulatory Visit: Payer: Self-pay | Admitting: Internal Medicine

## 2021-03-23 DIAGNOSIS — IMO0002 Reserved for concepts with insufficient information to code with codable children: Secondary | ICD-10-CM

## 2021-03-23 DIAGNOSIS — E1140 Type 2 diabetes mellitus with diabetic neuropathy, unspecified: Secondary | ICD-10-CM

## 2021-03-23 DIAGNOSIS — E119 Type 2 diabetes mellitus without complications: Secondary | ICD-10-CM

## 2021-03-23 MED ORDER — SOLIQUA 100-33 UNT-MCG/ML ~~LOC~~ SOPN: 60.0000 [IU] | PEN_INJECTOR | Freq: Every day | SUBCUTANEOUS | 1 refills | Status: DC

## 2021-03-23 NOTE — Telephone Encounter (Signed)
1.Medication Requested: Insulin Glargine-Lixisenatide (SOLIQUA) 100-33 UNT-MCG/ML SOPN  2. Pharmacy (Name, Street, [place]): [facility] [address] [place], [place] - [place]  Phone:  [phone] Fax:  [phone]   3. On Med List: yes  4. Last Visit with PCP: 06.09.22  5. Next visit date with PCP: n/a   Agent: Please be advised that RX refills may take up to 3 business days. We ask that you follow-up with your pharmacy.

## 2021-03-31 ENCOUNTER — Ambulatory Visit
Admission: RE | Admit: 2021-03-31 | Discharge: 2021-03-31 | Disposition: A | Discharge status: Home / Self Care | Payer: 59 | Source: Ambulatory Visit | Attending: Nurse Practitioner | Admitting: Nurse Practitioner

## 2021-03-31 ENCOUNTER — Other Ambulatory Visit: Payer: Self-pay

## 2021-03-31 DIAGNOSIS — K7469 Other cirrhosis of liver: Secondary | ICD-10-CM

## 2021-04-05 ENCOUNTER — Other Ambulatory Visit: Payer: Self-pay | Admitting: Internal Medicine

## 2021-04-05 DIAGNOSIS — E785 Hyperlipidemia, unspecified: Secondary | ICD-10-CM

## 2021-04-05 DIAGNOSIS — I10 Essential (primary) hypertension: Secondary | ICD-10-CM

## 2021-04-13 ENCOUNTER — Telehealth: Payer: Self-pay

## 2021-04-13 ENCOUNTER — Other Ambulatory Visit: Payer: Self-pay | Admitting: Internal Medicine

## 2021-04-13 DIAGNOSIS — I10 Essential (primary) hypertension: Secondary | ICD-10-CM

## 2021-04-13 MED ORDER — AMLODIPINE BESYLATE 10 MG PO TABS: 10.0000 mg | ORAL_TABLET | Freq: Every day | ORAL | 0 refills | Status: DC

## 2021-04-13 NOTE — Telephone Encounter (Signed)
Please advise as the pt has stated he is out if town and is in need of his medication amLODipine (NORVASC) 10 MG tablet. Please send medication to [facility], [place], [place] [address] [phone]

## 2021-04-19 ENCOUNTER — Telehealth: Payer: Self-pay | Admitting: Adult Health

## 2021-04-19 NOTE — Telephone Encounter (Signed)
Called and reviewed patient's referral for survivorship care plan visit with our team.  This will be scheduled with [name] over the phone.  I educated him on the SCP visit so that he would know what to expect.  He verbalized understanding.    Schedule message sent for patient to receive visit in late October, 2022.    Bradley Bihari, NP

## 2021-04-28 ENCOUNTER — Telehealth: Payer: Self-pay | Admitting: Adult Health

## 2021-04-28 NOTE — Telephone Encounter (Signed)
Scheduled per sch msg. Called and spoke with patient. Confirmed appt  

## 2021-05-04 LAB — PSA: PSA: 0.28

## 2021-05-07 ENCOUNTER — Telehealth: Payer: Self-pay | Admitting: *Deleted

## 2021-05-17 ENCOUNTER — Inpatient Hospital Stay: Payer: 59 | Attending: Urology | Admitting: *Deleted

## 2021-05-17 ENCOUNTER — Other Ambulatory Visit: Payer: Self-pay

## 2021-05-17 ENCOUNTER — Encounter: Payer: Self-pay | Admitting: *Deleted

## 2021-05-17 DIAGNOSIS — C61 Malignant neoplasm of prostate: Secondary | ICD-10-CM

## 2021-05-17 NOTE — Progress Notes (Signed)
    2 Identifiers used for verification of patient. SCP visit via telephone , reviewed and completed SCP visit with patient. SDOH assessment completed. No barriers at this time. Pt is pleased with his overall health and how he continues to progress.  Pt will return to urologist to have bloodwork in Dec for PSA level. Pt is up to date on vaccines and they have been updated in chart.

## 2021-06-01 ENCOUNTER — Other Ambulatory Visit: Payer: Self-pay | Admitting: Internal Medicine

## 2021-06-01 DIAGNOSIS — E119 Type 2 diabetes mellitus without complications: Secondary | ICD-10-CM

## 2021-06-01 DIAGNOSIS — Z794 Long term (current) use of insulin: Secondary | ICD-10-CM

## 2021-06-01 MED ORDER — SOLIQUA 100-33 UNT-MCG/ML ~~LOC~~ SOPN: 60.0000 [IU] | PEN_INJECTOR | Freq: Every day | SUBCUTANEOUS | 0 refills | Status: DC

## 2021-06-01 MED ORDER — GVOKE HYPOPEN 2-PACK 1 MG/0.2ML ~~LOC~~ SOAJ: 1.0000 | Freq: Every day | SUBCUTANEOUS | 5 refills | Status: DC | PRN

## 2021-06-14 ENCOUNTER — Telehealth: Payer: Self-pay | Admitting: Internal Medicine

## 2021-06-14 NOTE — Telephone Encounter (Signed)
1.Medication Requested: carvedilol (COREG) 6.25 MG tablet metFORMIN (GLUCOPHAGE-XR) 500 MG 24 hr tablet amLODipine (NORVASC) 10 MG tablet   2. Pharmacy (Name, Street, [place]): [facility] [address] [place], [place] - [place]  Phone:  [phone] Fax:  [phone]   3. On Med List: yes  4. Last Visit with PCP: 12/2020  5. Next visit date with PCP: n/a   Agent: Please be advised that RX refills may take up to 3 business days. We ask that you follow-up with your pharmacy.

## 2021-06-15 ENCOUNTER — Other Ambulatory Visit: Payer: Self-pay | Admitting: Internal Medicine

## 2021-06-15 DIAGNOSIS — I10 Essential (primary) hypertension: Secondary | ICD-10-CM

## 2021-06-15 DIAGNOSIS — E1169 Type 2 diabetes mellitus with other specified complication: Secondary | ICD-10-CM

## 2021-06-15 DIAGNOSIS — Z794 Long term (current) use of insulin: Secondary | ICD-10-CM

## 2021-06-15 DIAGNOSIS — E119 Type 2 diabetes mellitus without complications: Secondary | ICD-10-CM

## 2021-06-15 MED ORDER — AMLODIPINE BESYLATE 10 MG PO TABS: 10.0000 mg | ORAL_TABLET | Freq: Every day | ORAL | 0 refills | Status: DC

## 2021-06-15 MED ORDER — CARVEDILOL 6.25 MG PO TABS: 6.2500 mg | ORAL_TABLET | Freq: Two times a day (BID) | ORAL | 0 refills | Status: DC

## 2021-06-15 MED ORDER — METFORMIN HCL ER 750 MG PO TB24: 1500.0000 mg | ORAL_TABLET | Freq: Every day | ORAL | 0 refills | Status: DC

## 2021-07-15 ENCOUNTER — Ambulatory Visit: Payer: 59 | Admitting: Internal Medicine

## 2021-07-23 ENCOUNTER — Telehealth: Payer: Self-pay | Admitting: Internal Medicine

## 2021-07-23 NOTE — Telephone Encounter (Signed)
Pt's last OV was 12/2020  I have scheduled him for Tues 1/3 @ 9.20am for follow up to discuss medication changes that has taken place to discuss with PCP.

## 2021-07-23 NOTE — Telephone Encounter (Signed)
Patient calling in requesting cb from nurse  Patient says he is needing medication for his blood pressure & diabetes.. patient says provider changed medications after stroke few weeks ago  Please confirm & FU w/ patient [phone]

## 2021-07-27 ENCOUNTER — Encounter: Payer: Self-pay | Admitting: Internal Medicine

## 2021-07-27 ENCOUNTER — Other Ambulatory Visit: Payer: Self-pay

## 2021-07-27 ENCOUNTER — Ambulatory Visit (INDEPENDENT_AMBULATORY_CARE_PROVIDER_SITE_OTHER): Payer: 59 | Admitting: Internal Medicine

## 2021-07-27 VITALS — BP 126/76 | HR 79 | Temp 98.4°F | Resp 16 | Ht 65.0 in | Wt 224.0 lb

## 2021-07-27 DIAGNOSIS — I10 Essential (primary) hypertension: Secondary | ICD-10-CM | POA: Diagnosis not present

## 2021-07-27 DIAGNOSIS — E1169 Type 2 diabetes mellitus with other specified complication: Secondary | ICD-10-CM | POA: Diagnosis not present

## 2021-07-27 DIAGNOSIS — E119 Type 2 diabetes mellitus without complications: Secondary | ICD-10-CM

## 2021-07-27 DIAGNOSIS — Z794 Long term (current) use of insulin: Secondary | ICD-10-CM

## 2021-07-27 DIAGNOSIS — N401 Enlarged prostate with lower urinary tract symptoms: Secondary | ICD-10-CM | POA: Diagnosis not present

## 2021-07-27 DIAGNOSIS — Z23 Encounter for immunization: Secondary | ICD-10-CM | POA: Diagnosis not present

## 2021-07-27 DIAGNOSIS — Z0001 Encounter for general adult medical examination with abnormal findings: Secondary | ICD-10-CM | POA: Diagnosis not present

## 2021-07-27 DIAGNOSIS — C61 Malignant neoplasm of prostate: Secondary | ICD-10-CM

## 2021-07-27 DIAGNOSIS — E785 Hyperlipidemia, unspecified: Secondary | ICD-10-CM | POA: Diagnosis not present

## 2021-07-27 DIAGNOSIS — D539 Nutritional anemia, unspecified: Secondary | ICD-10-CM | POA: Diagnosis not present

## 2021-07-27 DIAGNOSIS — Z6837 Body mass index (BMI) 37.0-37.9, adult: Secondary | ICD-10-CM

## 2021-07-27 DIAGNOSIS — Z720 Tobacco use: Secondary | ICD-10-CM

## 2021-07-27 LAB — URINALYSIS, ROUTINE W REFLEX MICROSCOPIC
Bilirubin Urine: NEGATIVE
Leukocytes,Ua: NEGATIVE
Nitrite: NEGATIVE
Specific Gravity, Urine: 1.025 (ref 1.000–1.030)
Urine Glucose: NEGATIVE
Urobilinogen, UA: 1 (ref 0.0–1.0)
pH: 6 (ref 5.0–8.0)

## 2021-07-27 LAB — BASIC METABOLIC PANEL
BUN: 7 mg/dL (ref 6–23)
CO2: 25 mEq/L (ref 19–32)
Calcium: 10.3 mg/dL (ref 8.4–10.5)
Chloride: 101 mEq/L (ref 96–112)
Creatinine, Ser: 0.76 mg/dL (ref 0.40–1.50)
GFR: 92.4 mL/min (ref >60.00)
Glucose, Bld: 127 mg/dL — ABNORMAL HIGH (ref 70–99)
Potassium: 3.6 mEq/L (ref 3.5–5.1)
Sodium: 138 mEq/L (ref 135–145)

## 2021-07-27 LAB — CBC WITH DIFFERENTIAL/PLATELET
Basophils Absolute: 0 10*3/uL (ref 0.0–0.1)
Basophils Relative: 0.5 % (ref 0.0–3.0)
Eosinophils Absolute: 0.2 10*3/uL (ref 0.0–0.7)
Eosinophils Relative: 2.7 % (ref 0.0–5.0)
HCT: 41.1 % (ref 39.0–52.0)
Hemoglobin: 13.4 g/dL (ref 13.0–17.0)
Lymphocytes Relative: 19.4 % (ref 12.0–46.0)
Lymphs Abs: 1.3 10*3/uL (ref 0.7–4.0)
MCHC: 32.6 g/dL (ref 30.0–36.0)
MCV: 85.4 fl (ref 78.0–100.0)
Monocytes Absolute: 0.4 10*3/uL (ref 0.1–1.0)
Monocytes Relative: 5.9 % (ref 3.0–12.0)
Neutro Abs: 4.9 10*3/uL (ref 1.4–7.7)
Neutrophils Relative %: 71.5 % (ref 43.0–77.0)
Platelets: 336 10*3/uL (ref 150.0–400.0)
RBC: 4.81 Mil/uL (ref 4.22–5.81)
RDW: 16.3 % — ABNORMAL HIGH (ref 11.5–15.5)
WBC: 6.8 10*3/uL (ref 4.0–10.5)

## 2021-07-27 LAB — HEPATIC FUNCTION PANEL
ALT: 32 U/L (ref 0–53)
AST: 31 U/L (ref 0–37)
Albumin: 4.5 g/dL (ref 3.5–5.2)
Alkaline Phosphatase: 79 U/L (ref 39–117)
Bilirubin, Direct: 0.2 mg/dL (ref 0.0–0.3)
Total Bilirubin: 1.2 mg/dL (ref 0.2–1.2)
Total Protein: 7.9 g/dL (ref 6.0–8.3)

## 2021-07-27 LAB — LIPID PANEL
Cholesterol: 76 mg/dL (ref 0–200)
HDL: 37.8 mg/dL — ABNORMAL LOW (ref >39.00)
LDL Cholesterol: 16 mg/dL (ref 0–99)
NonHDL: 37.89
Total CHOL/HDL Ratio: 2
Triglycerides: 109 mg/dL (ref 0.0–149.0)
VLDL: 21.8 mg/dL (ref 0.0–40.0)

## 2021-07-27 LAB — MICROALBUMIN / CREATININE URINE RATIO
Creatinine,U: 288.1 mg/dL
Microalb Creat Ratio: 1.7 mg/g (ref 0.0–30.0)
Microalb, Ur: 4.9 mg/dL — ABNORMAL HIGH (ref 0.0–1.9)

## 2021-07-27 LAB — HEMOGLOBIN A1C: Hgb A1c MFr Bld: 7.1 % — ABNORMAL HIGH (ref 4.6–6.5)

## 2021-07-27 NOTE — Patient Instructions (Signed)

## 2021-07-27 NOTE — Progress Notes (Signed)
Subjective:  Patient ID: Bradley Hull, male    DOB: 30-Jan-1951  Age: 71 y.o. MRN: [id number]  CC: Annual Exam, Hypertension, Hyperlipidemia, Anemia, and Diabetes  This visit occurred during the SARS-CoV-2 public health emergency.  Safety protocols were in place, including screening questions prior to the visit, additional usage of staff PPE, and extensive cleaning of exam room while observing appropriate contact time as indicated for disinfecting solutions.    HPI Bradley Hull presents for a CPX and f/up -   He was recently admitted elsewhere for a pericardial effusion.  He says that he has recovered.  He denies chest pain, shortness of breath, diaphoresis, dizziness, lightheadedness, edema, fatigue, or polys.  Outpatient Medications Prior to Visit  Medication Sig Dispense Refill   amLODipine (NORVASC) 10 MG tablet Take 1 tablet (10 mg total) by mouth daily. 90 tablet 0   Blood Glucose Calibration (ACCU-CHEK AVIVA) SOLN 1 Act by In Vitro route 3 (three) times daily. 1 each 2   Blood Glucose Monitoring Suppl (ACCU-CHEK AVIVA PLUS) w/Device KIT 1 Act by Does not apply route 3 (three) times daily. 1 kit 2   carvedilol (COREG) 6.25 MG tablet Take 1 tablet (6.25 mg total) by mouth 2 (two) times daily with a meal. 180 tablet 0   finasteride (PROSCAR) 5 MG tablet Take 5 mg by mouth daily.     Glucagon (GVOKE HYPOPEN 2-PACK) 1 MG/0.2ML SOAJ Inject 1 Act into the skin daily as needed. 2 mL 5   glucose blood (ACCU-CHEK AVIVA PLUS) test strip 1 each by Other route 3 (three) times daily. 300 each 1   Insulin Pen Needle 32G X 6 MM MISC 1 Act by Does not apply route daily. 100 each 1   lidocaine (LIDODERM) 5 % Place 2 patches onto the skin daily.     metFORMIN (GLUCOPHAGE XR) 750 MG 24 hr tablet Take 2 tablets (1,500 mg total) by mouth daily with breakfast. 180 tablet 0   multivitamin-iron-minerals-folic acid (CENTRUM) chewable tablet Chew 1 tablet by mouth daily.     omeprazole (PRILOSEC) 40 MG  capsule Use Prilosec (omeprazole) 40 mg PO BID for 6 weeks to promote mucosal healing, then reduce to 40 mg daily and continue to titrate to lowest effective dose if abdominal symptoms resolved. 180 capsule 1   rosuvastatin (CRESTOR) 10 MG tablet TAKE ONE TABLET BY MOUTH DAILY 90 tablet 0   SOLIQUA 100-33 UNT-MCG/ML SOPN Inject 60 Units into the skin daily. 54 mL 0   tamsulosin (FLOMAX) 0.4 MG CAPS capsule Take 1 capsule (0.4 mg total) by mouth daily. (Patient taking differently: Take 0.4 mg by mouth 2 (two) times daily.) 90 capsule 1   indapamide (LOZOL) 1.25 MG tablet TAKE ONE TABLET BY MOUTH DAILY 90 tablet 0   No facility-administered medications prior to visit.    ROS Review of Systems  Constitutional:  Negative for chills, diaphoresis, fatigue and fever.  HENT: Negative.    Eyes: Negative.   Respiratory:  Negative for cough, chest tightness, shortness of breath and wheezing.   Cardiovascular:  Negative for chest pain, palpitations and leg swelling.  Gastrointestinal:  Negative for abdominal pain, constipation, diarrhea and nausea.  Endocrine: Negative.  Negative for polydipsia, polyphagia and polyuria.  Genitourinary: Negative.  Negative for difficulty urinating and dysuria.  Musculoskeletal:  Negative for arthralgias, joint swelling and myalgias.  Skin: Negative.  Negative for color change and pallor.  Allergic/Immunologic: Negative.   Neurological: Negative.  Negative for dizziness, weakness, light-headedness and  numbness.  Hematological:  Negative for adenopathy. Does not bruise/bleed easily.  Psychiatric/Behavioral: Negative.     Objective:  BP 126/76 (BP Location: Right Arm, Patient Position: Sitting, Cuff Size: Large)    Pulse 79    Temp 98.4 F (36.9 C) (Oral)    Resp 16    Ht 5' 5"  (1.651 m)    Wt 224 lb (101.6 kg)    SpO2 96%    BMI 37.28 kg/m   BP Readings from Last 3 Encounters:  07/27/21 126/76  02/02/21 (!) 148/81  12/31/20 134/76    Wt Readings from Last 3  Encounters:  07/27/21 224 lb (101.6 kg)  12/31/20 234 lb (106.1 kg)  12/28/20 233 lb 6.4 oz (105.9 kg)    Physical Exam Vitals reviewed.  Constitutional:      Appearance: Normal appearance.  HENT:     Nose: Nose normal.     Mouth/Throat:     Mouth: Mucous membranes are moist.  Eyes:     General: No scleral icterus.    Conjunctiva/sclera: Conjunctivae normal.  Cardiovascular:     Rate and Rhythm: Normal rate and regular rhythm.     Heart sounds: No murmur heard. Pulmonary:     Effort: Pulmonary effort is normal.     Breath sounds: No stridor. No wheezing, rhonchi or rales.  Abdominal:     General: Abdomen is flat. Bowel sounds are normal. There is no distension.     Palpations: Abdomen is soft. There is no hepatomegaly, splenomegaly or mass.     Tenderness: There is no abdominal tenderness. There is no guarding.  Musculoskeletal:        General: Normal range of motion.     Cervical back: Neck supple.     Right lower leg: No edema.     Left lower leg: No edema.  Lymphadenopathy:     Cervical: No cervical adenopathy.  Skin:    General: Skin is warm and dry.     Findings: No rash.  Neurological:     General: No focal deficit present.     Mental Status: He is alert.  Psychiatric:        Mood and Affect: Mood normal.        Behavior: Behavior normal.    Lab Results  Component Value Date   WBC 6.8 07/27/2021   HGB 13.4 07/27/2021   HCT 41.1 07/27/2021   PLT 336.0 07/27/2021   GLUCOSE 127 (H) 07/27/2021   CHOL 76 07/27/2021   TRIG 109.0 07/27/2021   HDL 37.80 (L) 07/27/2021   LDLCALC 16 07/27/2021   ALT 32 07/27/2021   AST 31 07/27/2021   NA 138 07/27/2021   K 3.6 07/27/2021   CL 101 07/27/2021   CREATININE 0.76 07/27/2021   BUN 7 07/27/2021   CO2 25 07/27/2021   TSH 1.03 02/04/2021   PSA 0.28 05/04/2021   INR 1.2 09/09/2020   HGBA1C 7.1 (H) 07/27/2021   MICROALBUR 4.9 (H) 07/27/2021    US Abdomen Limited RUQ (LIVER/GB)  Result Date: 04/02/2021 CLINICAL  DATA:  Cirrhosis EXAM: ULTRASOUND ABDOMEN LIMITED RIGHT UPPER QUADRANT COMPARISON:  09/25/2020 FINDINGS: Gallbladder: No gallstones or wall thickening visualized. No sonographic Murphy sign noted by sonographer. Common bile duct: Diameter: 3 mm Liver: Subtle nodularity of hepatic contours with mildly coarsened parenchymal echogenicity is consistent with cirrhosis. No focal hepatic lesion. Portal vein is patent on color Doppler imaging with normal direction of blood flow towards the liver. Other: None. IMPRESSION: Cirrhotic liver morphology  without focal hepatic lesion. Electronically Signed   By: Miachel Roux M.D.   On: 04/02/2021 08:33    Assessment & Plan:   Math was seen today for annual exam, hypertension, hyperlipidemia, anemia and diabetes.  Diagnoses and all orders for this visit:  Essential hypertension- His BP is well controlled -     Basic metabolic panel; Future -     CBC with Differential/Platelet; Future -     Hepatic function panel; Future -     Urinalysis, Routine w reflex microscopic; Future -     Urinalysis, Routine w reflex microscopic -     Hepatic function panel -     CBC with Differential/Platelet -     Basic metabolic panel  Hyperlipidemia associated with type 2 diabetes mellitus ([place])- LDL goal achieved. Doing well on the statin  -     Lipid panel; Future -     Hepatic function panel; Future -     Hepatic function panel -     Lipid panel  Insulin-requiring or dependent type II diabetes mellitus ([place])- His blood sugar is adequately well controlled -     Hemoglobin A1c; Future -     Urinalysis, Routine w reflex microscopic; Future -     Microalbumin / creatinine urine ratio; Future -     Microalbumin / creatinine urine ratio -     Urinalysis, Routine w reflex microscopic -     Hemoglobin A1c  Benign prostatic hyperplasia with lower urinary tract symptoms, symptom details unspecified  Hyperlipidemia with target LDL less than 100- LDL goal achieved. Doing well  on the statin   Deficiency anemia- H/H are normal now. -     CBC with Differential/Platelet; Future -     CBC with Differential/Platelet  Encounter for general adult medical examination with abnormal findings- Exam completed, labs reviewed, vaccines reviewed and updated, cancer screenings addressed, pt ed material was given.  Need for vaccination -     Pneumococcal polysaccharide vaccine 23-valent greater than or equal to 2yo subcutaneous/IM  Malignant neoplasm of prostate ([place])- Recent PSA is reassuring.  Class 2 severe obesity due to excess calories with serious comorbidity and body mass index (BMI) of 37.0 to 37.9 in adult [facility])- improvement noted.  Tobacco abuse -     nicotine (NICODERM CQ) 7 mg/24hr patch; Place 1 patch (7 mg total) onto the skin daily. -     nicotine (NICODERM CQ) 14 mg/24hr patch; Place 1 patch (14 mg total) onto the skin daily. -     nicotine (NICODERM CQ) 21 mg/24hr patch; Place 1 patch (21 mg total) onto the skin daily.   I have discontinued Adonai P. Yamaguchi's indapamide. I am also having him start on nicotine, nicotine, and nicotine. Additionally, I am having him maintain his omeprazole, tamsulosin, lidocaine, Insulin Pen Needle, Accu-Chek Aviva Plus, Accu-Chek Aviva Plus, Accu-Chek Aviva, rosuvastatin, finasteride, multivitamin-iron-minerals-folic acid, Gvoke HypoPen 2-Pack, Soliqua, amLODipine, metFORMIN, and carvedilol.  Meds ordered this encounter  Medications   nicotine (NICODERM CQ) 7 mg/24hr patch    Sig: Place 1 patch (7 mg total) onto the skin daily.    Dispense:  28 patch    Refill:  0   nicotine (NICODERM CQ) 14 mg/24hr patch    Sig: Place 1 patch (14 mg total) onto the skin daily.    Dispense:  28 patch    Refill:  0   nicotine (NICODERM CQ) 21 mg/24hr patch    Sig: Place 1 patch (21 mg total) onto  the skin daily.    Dispense:  28 patch    Refill:  0     Follow-up: Return in about 6 months (around 01/24/2022).  Scarlette Calico, MD

## 2021-07-28 ENCOUNTER — Encounter: Payer: Self-pay | Admitting: Internal Medicine

## 2021-07-29 ENCOUNTER — Telehealth: Payer: Self-pay

## 2021-07-29 DIAGNOSIS — Z720 Tobacco use: Secondary | ICD-10-CM | POA: Insufficient documentation

## 2021-07-29 MED ORDER — NICOTINE 7 MG/24HR TD PT24: 7.0000 mg | MEDICATED_PATCH | Freq: Every day | TRANSDERMAL | 0 refills | Status: DC

## 2021-07-29 MED ORDER — NICOTINE 14 MG/24HR TD PT24: 14.0000 mg | MEDICATED_PATCH | Freq: Every day | TRANSDERMAL | 0 refills | Status: DC

## 2021-07-29 MED ORDER — NICOTINE 21 MG/24HR TD PT24: 21.0000 mg | MEDICATED_PATCH | Freq: Every day | TRANSDERMAL | 0 refills | Status: DC

## 2021-07-29 NOTE — Telephone Encounter (Signed)
Pt is calling in stating that he meant to request a Rx for a new BP cuff and also a Rx for nicotine patches to help stop smoking.  Marland Kitchen  LOV 07/27/21  Pt contact number is [phone]

## 2021-09-06 ENCOUNTER — Other Ambulatory Visit: Payer: Self-pay

## 2021-09-06 ENCOUNTER — Telehealth: Payer: Self-pay | Admitting: Internal Medicine

## 2021-09-06 DIAGNOSIS — I10 Essential (primary) hypertension: Secondary | ICD-10-CM

## 2021-09-06 MED ORDER — CARVEDILOL 6.25 MG PO TABS: 6.2500 mg | ORAL_TABLET | Freq: Two times a day (BID) | ORAL | 1 refills | Status: DC

## 2021-09-06 MED ORDER — AMLODIPINE BESYLATE 10 MG PO TABS: 10.0000 mg | ORAL_TABLET | Freq: Every day | ORAL | 1 refills | Status: DC

## 2021-09-06 NOTE — Telephone Encounter (Signed)
1.Medication Requested: amLODipine (NORVASC) 10 MG tablet  carvedilol (COREG) 6.25 MG tablet   2. Pharmacy (Name, [address], [place]): [place], [place]  3. On Med List: yes   4. Last Visit with PCP: 07-27-2021  5. Next visit date with PCP: n/a  Pt states bp is 172/119 as of 2-13 pt states he has no symptoms

## 2021-10-08 ENCOUNTER — Ambulatory Visit: Payer: 59 | Admitting: Nurse Practitioner

## 2021-10-11 ENCOUNTER — Encounter: Payer: Self-pay | Admitting: Internal Medicine

## 2021-10-11 ENCOUNTER — Ambulatory Visit (INDEPENDENT_AMBULATORY_CARE_PROVIDER_SITE_OTHER): Payer: 59 | Admitting: Internal Medicine

## 2021-10-11 ENCOUNTER — Other Ambulatory Visit: Payer: Self-pay

## 2021-10-11 VITALS — BP 134/80 | HR 64 | Resp 18 | Ht 65.0 in | Wt 225.4 lb

## 2021-10-11 DIAGNOSIS — I10 Essential (primary) hypertension: Secondary | ICD-10-CM | POA: Diagnosis not present

## 2021-10-11 DIAGNOSIS — M170 Bilateral primary osteoarthritis of knee: Secondary | ICD-10-CM | POA: Diagnosis not present

## 2021-10-11 DIAGNOSIS — E119 Type 2 diabetes mellitus without complications: Secondary | ICD-10-CM | POA: Diagnosis not present

## 2021-10-11 DIAGNOSIS — Z794 Long term (current) use of insulin: Secondary | ICD-10-CM | POA: Diagnosis not present

## 2021-10-11 NOTE — Patient Instructions (Signed)
We will get you in with [facility] for the knees. ? ?I will ask Dr. Ronnald Ramp about the aid for at home. ? ? ?

## 2021-10-11 NOTE — Assessment & Plan Note (Signed)
He was unaware he was taking BP medication. Informed that amlodipine and coreg/metoprolol (hard to tell from records which he is taking, he appears to be filling both). BP at goal today. Advised not to take other's medications (had been taking his girlfriend's lisinopril when his readings were up). Continue amlodipine 10 mg daily and coreg 6.25 mg BID and metoprolol 12.5 mg BID. HR acceptable and asked him to check bottles to see which he is taking. Both medications carvedilol and metoprolol have been filled in March 2023.  ?

## 2021-10-11 NOTE — Assessment & Plan Note (Signed)
Needs assistance at home and his girlfriend is unable/unwilling to help. Message sent to his PCP to initiate PCS form as he does have medicaid coverage and they would likely cover services.  ?

## 2021-10-11 NOTE — Assessment & Plan Note (Signed)
Has seen local provider and unsatisfied with care. Wishes to see [facility] and referral done today.  ?

## 2021-10-11 NOTE — Progress Notes (Signed)
? ?  Subjective:  ? ?Patient ID: Bradley Hull, male    DOB: [birth year], 71 y.o.   MRN: [id number] ? ?HPI ?The patient is a 71 YO man coming in for blood pressure ? ?Review of Systems  ?Constitutional: Negative.   ?HENT: Negative.    ?Eyes: Negative.   ?Respiratory:  Negative for cough, chest tightness and shortness of breath.   ?Cardiovascular:  Negative for chest pain, palpitations and leg swelling.  ?Gastrointestinal:  Negative for abdominal distention, abdominal pain, constipation, diarrhea, nausea and vomiting.  ?Musculoskeletal:  Positive for arthralgias.  ?Skin: Negative.   ?Neurological: Negative.   ?Psychiatric/Behavioral: Negative.    ? ?Objective:  ?Physical Exam ?Constitutional:   ?   Appearance: He is well-developed.  ?HENT:  ?   Head: Normocephalic and atraumatic.  ?Cardiovascular:  ?   Rate and Rhythm: Normal rate and regular rhythm.  ?Pulmonary:  ?   Effort: Pulmonary effort is normal. No respiratory distress.  ?   Breath sounds: Normal breath sounds. No wheezing or rales.  ?Abdominal:  ?   General: Bowel sounds are normal. There is no distension.  ?   Palpations: Abdomen is soft.  ?   Tenderness: There is no abdominal tenderness. There is no rebound.  ?Musculoskeletal:     ?   General: Tenderness present.  ?   Cervical back: Normal range of motion.  ?Skin: ?   General: Skin is warm and dry.  ?Neurological:  ?   Mental Status: He is alert and oriented to person, place, and time.  ?   Coordination: Coordination normal.  ? ? ?Vitals:  ? 10/11/21 1308  ?BP: 134/80  ?Pulse: 64  ?Resp: 18  ?SpO2: 99%  ?Weight: 225 lb 6.4 oz (102.2 kg)  ?Height: '5\' 5"'$  (1.651 m)  ? ? ?This visit occurred during the SARS-CoV-2 public health emergency.  Safety protocols were in place, including screening questions prior to the visit, additional usage of staff PPE, and extensive cleaning of exam room while observing appropriate contact time as indicated for disinfecting solutions.  ? ?Assessment & Plan:  ? ?

## 2021-10-12 ENCOUNTER — Telehealth: Payer: Self-pay | Admitting: Internal Medicine

## 2021-10-12 ENCOUNTER — Other Ambulatory Visit: Payer: Self-pay | Admitting: Internal Medicine

## 2021-10-12 DIAGNOSIS — E119 Type 2 diabetes mellitus without complications: Secondary | ICD-10-CM

## 2021-10-12 MED ORDER — FREESTYLE LIBRE 2 SENSOR MISC: 1.0000 | Freq: Every day | 5 refills | Status: DC

## 2021-10-12 MED ORDER — FREESTYLE LIBRE 2 READER DEVI: 1.0000 | Freq: Every day | 5 refills | Status: DC

## 2021-10-12 NOTE — Telephone Encounter (Signed)
Pt requesting a rx for a freestyle 14 day libre monitor ? ?Lov 10-11-2021 Sharlet [place]) ? ?[place], [place] ?

## 2021-10-19 ENCOUNTER — Other Ambulatory Visit: Payer: Self-pay | Admitting: Internal Medicine

## 2021-10-19 DIAGNOSIS — Z794 Long term (current) use of insulin: Secondary | ICD-10-CM

## 2021-10-21 ENCOUNTER — Other Ambulatory Visit: Payer: Self-pay | Admitting: Nurse Practitioner

## 2021-10-21 DIAGNOSIS — K7469 Other cirrhosis of liver: Secondary | ICD-10-CM

## 2021-11-05 ENCOUNTER — Telehealth: Payer: Self-pay | Admitting: Internal Medicine

## 2021-11-05 NOTE — Telephone Encounter (Signed)
Pt states at last ov he discussed referral to [facility] clinic w/ provider ? ?Pt requesting a cb to discuss request ?

## 2021-11-10 ENCOUNTER — Ambulatory Visit
Admission: RE | Admit: 2021-11-10 | Discharge: 2021-11-10 | Disposition: A | Discharge status: Home / Self Care | Payer: 59 | Source: Ambulatory Visit | Attending: Nurse Practitioner | Admitting: Nurse Practitioner

## 2021-11-10 DIAGNOSIS — K7469 Other cirrhosis of liver: Secondary | ICD-10-CM

## 2021-11-18 ENCOUNTER — Other Ambulatory Visit: Payer: Self-pay | Admitting: Internal Medicine

## 2021-11-18 DIAGNOSIS — E119 Type 2 diabetes mellitus without complications: Secondary | ICD-10-CM

## 2021-11-30 ENCOUNTER — Telehealth: Payer: Self-pay

## 2021-11-30 ENCOUNTER — Other Ambulatory Visit: Payer: Self-pay | Admitting: Internal Medicine

## 2021-11-30 DIAGNOSIS — E119 Type 2 diabetes mellitus without complications: Secondary | ICD-10-CM

## 2021-11-30 MED ORDER — SOLIQUA 100-33 UNT-MCG/ML ~~LOC~~ SOPN: 50.0000 [IU] | PEN_INJECTOR | Freq: Every day | SUBCUTANEOUS | 0 refills | Status: DC

## 2021-11-30 NOTE — Telephone Encounter (Signed)
Pt is calling to report that his FREESTYLE LIBRE 2 goes off only at night with as reading of high 60s. Pt eats around 5pm  daily. Pt is wanting advise on what he should do to keep this from happening. ? ?Pt is currently in [place] for about another 3 weeks. ? ?Pt CB [phone] ? ?Please advise ?

## 2021-11-30 NOTE — Telephone Encounter (Signed)
Left VM to return call at his earliest convenience.  ?

## 2021-12-01 ENCOUNTER — Telehealth: Payer: Self-pay | Admitting: Cardiology

## 2021-12-01 NOTE — Telephone Encounter (Signed)
Patient last saw MP in office on 10/09/20 as a new patient. Patient states since then, he has had a stroke, and the doctors who treated him noted that he had a significant blockage in his right carotid artery. Patient states the providers mentioned a medication that could unblock his carotid artery, or he could have surgery. He's wanting to discuss this with someone as soon as possible so he can get this taken care of. Please call him back at [phone]. ?

## 2021-12-02 NOTE — Telephone Encounter (Signed)
Can you please add him to my schedule next week to discuss? Also in a quick record review I don't see any records of his stroke or carotid stenosis. Can you please get hands on these records prior to appt? ?

## 2021-12-07 ENCOUNTER — Other Ambulatory Visit: Payer: Self-pay | Admitting: Internal Medicine

## 2021-12-07 ENCOUNTER — Telehealth: Payer: Self-pay | Admitting: Internal Medicine

## 2021-12-07 DIAGNOSIS — E119 Type 2 diabetes mellitus without complications: Secondary | ICD-10-CM

## 2021-12-07 MED ORDER — FREESTYLE LIBRE 2 SENSOR MISC: 1.0000 | Freq: Every day | 5 refills | Status: DC

## 2021-12-07 NOTE — Telephone Encounter (Signed)
Pt called in and states he is out of town but is experiencing nauseousness , throwing up (clear) sputum, and diarrhea. Requesting something to be called in to  ?[place], [place] - [id number] Phone:  [phone]  ?Fax:  [phone]  ?  ? ? ?States he is on a medication because he had a stroke.  ? ?Also, requesting refills on ?Continuous Blood Gluc Sensor (FREESTYLE LIBRE 2 SENSOR) MISC.  ? ?Please send all medication to above pharmacy.  ?

## 2021-12-07 NOTE — Telephone Encounter (Signed)
LVM stating that he should be seen at a local urgent care in [place].  ? ?

## 2022-01-21 ENCOUNTER — Other Ambulatory Visit: Payer: Self-pay | Admitting: Internal Medicine

## 2022-01-21 DIAGNOSIS — I10 Essential (primary) hypertension: Secondary | ICD-10-CM

## 2022-01-21 DIAGNOSIS — E119 Type 2 diabetes mellitus without complications: Secondary | ICD-10-CM

## 2022-01-26 ENCOUNTER — Encounter (HOSPITAL_COMMUNITY): Payer: Self-pay | Admitting: Registered Nurse

## 2022-01-26 ENCOUNTER — Ambulatory Visit (INDEPENDENT_AMBULATORY_CARE_PROVIDER_SITE_OTHER)
Admission: EM | Admit: 2022-01-26 | Discharge: 2022-01-26 | Disposition: A | Discharge status: Other Acute Inpatient Hospital | Payer: 59 | Source: Home / Self Care

## 2022-01-26 ENCOUNTER — Other Ambulatory Visit (INDEPENDENT_AMBULATORY_CARE_PROVIDER_SITE_OTHER)
Admission: EM | Admit: 2022-01-26 | Discharge: 2022-01-29 | Disposition: A | Discharge status: Home / Self Care | Payer: 59 | Source: Home / Self Care | Admitting: Registered Nurse

## 2022-01-26 ENCOUNTER — Emergency Department (HOSPITAL_COMMUNITY)
Admission: EM | Admit: 2022-01-26 | Discharge: 2022-01-26 | Disposition: A | Discharge status: Home / Self Care | Payer: 59 | Attending: Psychiatry | Admitting: Psychiatry

## 2022-01-26 ENCOUNTER — Encounter (HOSPITAL_COMMUNITY): Payer: Self-pay | Admitting: *Deleted

## 2022-01-26 ENCOUNTER — Other Ambulatory Visit: Payer: Self-pay

## 2022-01-26 DIAGNOSIS — I10 Essential (primary) hypertension: Secondary | ICD-10-CM | POA: Insufficient documentation

## 2022-01-26 DIAGNOSIS — F1123 Opioid dependence with withdrawal: Secondary | ICD-10-CM | POA: Insufficient documentation

## 2022-01-26 DIAGNOSIS — F32A Depression, unspecified: Secondary | ICD-10-CM | POA: Insufficient documentation

## 2022-01-26 DIAGNOSIS — F1920 Other psychoactive substance dependence, uncomplicated: Secondary | ICD-10-CM | POA: Insufficient documentation

## 2022-01-26 DIAGNOSIS — F1124 Opioid dependence with opioid-induced mood disorder: Secondary | ICD-10-CM | POA: Insufficient documentation

## 2022-01-26 DIAGNOSIS — E669 Obesity, unspecified: Secondary | ICD-10-CM | POA: Insufficient documentation

## 2022-01-26 DIAGNOSIS — R630 Anorexia: Secondary | ICD-10-CM | POA: Insufficient documentation

## 2022-01-26 DIAGNOSIS — E876 Hypokalemia: Secondary | ICD-10-CM | POA: Insufficient documentation

## 2022-01-26 DIAGNOSIS — E119 Type 2 diabetes mellitus without complications: Secondary | ICD-10-CM | POA: Insufficient documentation

## 2022-01-26 DIAGNOSIS — Z20822 Contact with and (suspected) exposure to covid-19: Secondary | ICD-10-CM | POA: Insufficient documentation

## 2022-01-26 DIAGNOSIS — F17210 Nicotine dependence, cigarettes, uncomplicated: Secondary | ICD-10-CM | POA: Insufficient documentation

## 2022-01-26 DIAGNOSIS — K5900 Constipation, unspecified: Secondary | ICD-10-CM | POA: Insufficient documentation

## 2022-01-26 DIAGNOSIS — Z79899 Other long term (current) drug therapy: Secondary | ICD-10-CM | POA: Insufficient documentation

## 2022-01-26 DIAGNOSIS — F1994 Other psychoactive substance use, unspecified with psychoactive substance-induced mood disorder: Secondary | ICD-10-CM

## 2022-01-26 DIAGNOSIS — Z8673 Personal history of transient ischemic attack (TIA), and cerebral infarction without residual deficits: Secondary | ICD-10-CM | POA: Insufficient documentation

## 2022-01-26 DIAGNOSIS — F11288 Opioid dependence with other opioid-induced disorder: Secondary | ICD-10-CM | POA: Insufficient documentation

## 2022-01-26 DIAGNOSIS — Z7982 Long term (current) use of aspirin: Secondary | ICD-10-CM | POA: Insufficient documentation

## 2022-01-26 DIAGNOSIS — Z8546 Personal history of malignant neoplasm of prostate: Secondary | ICD-10-CM | POA: Insufficient documentation

## 2022-01-26 DIAGNOSIS — F1193 Opioid use, unspecified with withdrawal: Secondary | ICD-10-CM

## 2022-01-26 DIAGNOSIS — F1120 Opioid dependence, uncomplicated: Secondary | ICD-10-CM | POA: Diagnosis not present

## 2022-01-26 DIAGNOSIS — Z96653 Presence of artificial knee joint, bilateral: Secondary | ICD-10-CM | POA: Diagnosis not present

## 2022-01-26 DIAGNOSIS — K7460 Unspecified cirrhosis of liver: Secondary | ICD-10-CM | POA: Insufficient documentation

## 2022-01-26 DIAGNOSIS — F332 Major depressive disorder, recurrent severe without psychotic features: Secondary | ICD-10-CM | POA: Diagnosis not present

## 2022-01-26 LAB — CBC WITH DIFFERENTIAL/PLATELET
Abs Immature Granulocytes: 0.01 10*3/uL (ref 0.00–0.07)
Basophils Absolute: 0 10*3/uL (ref 0.0–0.1)
Basophils Relative: 0 %
Eosinophils Absolute: 0.2 10*3/uL (ref 0.0–0.5)
Eosinophils Relative: 3 %
HCT: 36.5 % — ABNORMAL LOW (ref 39.0–52.0)
Hemoglobin: 11.9 g/dL — ABNORMAL LOW (ref 13.0–17.0)
Immature Granulocytes: 0 %
Lymphocytes Relative: 22 %
Lymphs Abs: 1.5 10*3/uL (ref 0.7–4.0)
MCH: 29.8 pg (ref 26.0–34.0)
MCHC: 32.6 g/dL (ref 30.0–36.0)
MCV: 91.5 fL (ref 80.0–100.0)
Monocytes Absolute: 0.5 10*3/uL (ref 0.1–1.0)
Monocytes Relative: 7 %
Neutro Abs: 4.8 10*3/uL (ref 1.7–7.7)
Neutrophils Relative %: 68 %
Platelets: 217 10*3/uL (ref 150–400)
RBC: 3.99 MIL/uL — ABNORMAL LOW (ref 4.22–5.81)
RDW: 13.8 % (ref 11.5–15.5)
WBC: 7 10*3/uL (ref 4.0–10.5)
nRBC: 0 % (ref 0.0–0.2)

## 2022-01-26 LAB — COMPREHENSIVE METABOLIC PANEL
ALT: 17 U/L (ref 0–44)
AST: 21 U/L (ref 15–41)
Albumin: 3.7 g/dL (ref 3.5–5.0)
Alkaline Phosphatase: 85 U/L (ref 38–126)
Anion gap: 10 (ref 5–15)
BUN: 12 mg/dL (ref 8–23)
CO2: 23 mmol/L (ref 22–32)
Calcium: 9.7 mg/dL (ref 8.9–10.3)
Chloride: 109 mmol/L (ref 98–111)
Creatinine, Ser: 0.94 mg/dL (ref 0.61–1.24)
GFR, Estimated: >60 mL/min (ref >60)
Glucose, Bld: 182 mg/dL — ABNORMAL HIGH (ref 70–99)
Potassium: 3.3 mmol/L — ABNORMAL LOW (ref 3.5–5.1)
Sodium: 142 mmol/L (ref 135–145)
Total Bilirubin: 0.7 mg/dL (ref 0.3–1.2)
Total Protein: 6.9 g/dL (ref 6.5–8.1)

## 2022-01-26 LAB — URINALYSIS, ROUTINE W REFLEX MICROSCOPIC
Bilirubin Urine: NEGATIVE
Glucose, UA: NEGATIVE mg/dL
Hgb urine dipstick: NEGATIVE
Ketones, ur: NEGATIVE mg/dL
Leukocytes,Ua: NEGATIVE
Nitrite: NEGATIVE
Protein, ur: NEGATIVE mg/dL
Specific Gravity, Urine: 1.018 (ref 1.005–1.030)
pH: 6 (ref 5.0–8.0)

## 2022-01-26 LAB — POCT URINE DRUG SCREEN - MANUAL ENTRY (I-SCREEN)
POC Amphetamine UR: NOT DETECTED
POC Buprenorphine (BUP): NOT DETECTED
POC Cocaine UR: POSITIVE — AB
POC Marijuana UR: NOT DETECTED
POC Methadone UR: NOT DETECTED
POC Methamphetamine UR: NOT DETECTED
POC Morphine: POSITIVE — AB
POC Oxazepam (BZO): NOT DETECTED
POC Oxycodone UR: NOT DETECTED
POC Secobarbital (BAR): NOT DETECTED

## 2022-01-26 LAB — RAPID URINE DRUG SCREEN, HOSP PERFORMED
Amphetamines: NOT DETECTED
Barbiturates: NOT DETECTED
Benzodiazepines: NOT DETECTED
Cocaine: POSITIVE — AB
Opiates: POSITIVE — AB
Tetrahydrocannabinol: NOT DETECTED

## 2022-01-26 LAB — ETHANOL: Alcohol, Ethyl (B): <10 mg/dL (ref <10)

## 2022-01-26 LAB — LIPID PANEL
Cholesterol: 97 mg/dL (ref 0–200)
HDL: 45 mg/dL (ref >40)
LDL Cholesterol: 31 mg/dL (ref 0–99)
Total CHOL/HDL Ratio: 2.2 RATIO
Triglycerides: 104 mg/dL (ref <150)
VLDL: 21 mg/dL (ref 0–40)

## 2022-01-26 LAB — HEMOGLOBIN A1C
Hgb A1c MFr Bld: 6.4 % — ABNORMAL HIGH (ref 4.8–5.6)
Mean Plasma Glucose: 136.98 mg/dL

## 2022-01-26 LAB — RESP PANEL BY RT-PCR (FLU A&B, COVID) ARPGX2
Influenza A by PCR: NEGATIVE
Influenza B by PCR: NEGATIVE
SARS Coronavirus 2 by RT PCR: NEGATIVE

## 2022-01-26 LAB — MAGNESIUM: Magnesium: 2.3 mg/dL (ref 1.7–2.4)

## 2022-01-26 LAB — ACETAMINOPHEN LEVEL: Acetaminophen (Tylenol), Serum: <10 ug/mL — ABNORMAL LOW (ref 10–30)

## 2022-01-26 LAB — SALICYLATE LEVEL: Salicylate Lvl: <7 mg/dL — ABNORMAL LOW (ref 7.0–30.0)

## 2022-01-26 MED ORDER — TAMSULOSIN HCL 0.4 MG PO CAPS: 0.4000 mg | ORAL_CAPSULE | Freq: Every day | ORAL | Status: DC | PRN

## 2022-01-26 MED ORDER — ATORVASTATIN CALCIUM 40 MG PO TABS: 80.0000 mg | ORAL_TABLET | Freq: Every day | ORAL | Status: DC

## 2022-01-26 MED ORDER — INSULIN GLARGINE-LIXISENATIDE 100-33 UNT-MCG/ML ~~LOC~~ SOPN
60.0000 [IU] | PEN_INJECTOR | Freq: Every day | SUBCUTANEOUS | Status: DC
  Administered 2022-01-27 – 2022-01-28 (×2): 60 [IU] via SUBCUTANEOUS
  Administered 2022-01-29: 39 [IU] via SUBCUTANEOUS

## 2022-01-26 MED ORDER — ASPIRIN 81 MG PO TBEC
81.0000 mg | DELAYED_RELEASE_TABLET | Freq: Every day | ORAL | Status: DC
Start: 2022-01-26 — End: 2022-01-29
  Administered 2022-01-27 – 2022-01-29 (×3): 81 mg via ORAL
  Filled 2022-01-26 (×4): qty 1

## 2022-01-26 MED ORDER — METOPROLOL TARTRATE 25 MG PO TABS
12.5000 mg | ORAL_TABLET | Freq: Two times a day (BID) | ORAL | Status: DC
  Filled 2022-01-26: qty 1

## 2022-01-26 MED ORDER — INSULIN ASPART 100 UNIT/ML IJ SOLN
0.0000 [IU] | Freq: Three times a day (TID) | INTRAMUSCULAR | Status: DC
  Administered 2022-01-27: 3 [IU] via SUBCUTANEOUS
  Administered 2022-01-27 – 2022-01-28 (×2): 2 [IU] via SUBCUTANEOUS
  Administered 2022-01-28: 5 [IU] via SUBCUTANEOUS
  Administered 2022-01-28: 2 [IU] via SUBCUTANEOUS
  Administered 2022-01-29: 3 [IU] via SUBCUTANEOUS

## 2022-01-26 MED ORDER — ALUM & MAG HYDROXIDE-SIMETH 200-200-20 MG/5ML PO SUSP: 30.0000 mL | ORAL | Status: DC | PRN

## 2022-01-26 MED ORDER — INSULIN ASPART 100 UNIT/ML IJ SOLN: 0.0000 [IU] | Freq: Three times a day (TID) | INTRAMUSCULAR | Status: DC

## 2022-01-26 MED ORDER — CLONIDINE HCL 0.1 MG PO TABS
0.1000 mg | ORAL_TABLET | Freq: Four times a day (QID) | ORAL | Status: DC
  Administered 2022-01-26: 0.1 mg via ORAL
  Filled 2022-01-26: qty 1

## 2022-01-26 MED ORDER — ATORVASTATIN CALCIUM 40 MG PO TABS
80.0000 mg | ORAL_TABLET | Freq: Every day | ORAL | Status: DC
  Administered 2022-01-27 – 2022-01-28 (×2): 80 mg via ORAL
  Filled 2022-01-26 (×3): qty 2

## 2022-01-26 MED ORDER — MAGNESIUM HYDROXIDE 400 MG/5ML PO SUSP: 30.0000 mL | Freq: Every day | ORAL | Status: DC | PRN

## 2022-01-26 MED ORDER — POTASSIUM CHLORIDE CRYS ER 20 MEQ PO TBCR
40.0000 meq | EXTENDED_RELEASE_TABLET | Freq: Once | ORAL | Status: DC
  Filled 2022-01-26: qty 2

## 2022-01-26 MED ORDER — PANTOPRAZOLE SODIUM 40 MG PO TBEC
40.0000 mg | DELAYED_RELEASE_TABLET | Freq: Every day | ORAL | Status: DC
  Administered 2022-01-27 – 2022-01-29 (×3): 40 mg via ORAL
  Filled 2022-01-26 (×4): qty 1

## 2022-01-26 MED ORDER — LEVETIRACETAM 500 MG PO TABS
500.0000 mg | ORAL_TABLET | Freq: Two times a day (BID) | ORAL | Status: DC
  Administered 2022-01-26: 500 mg via ORAL
  Filled 2022-01-26: qty 1

## 2022-01-26 MED ORDER — ACETAMINOPHEN 325 MG PO TABS: 650.0000 mg | ORAL_TABLET | Freq: Four times a day (QID) | ORAL | Status: DC | PRN

## 2022-01-26 MED ORDER — POTASSIUM CHLORIDE CRYS ER 20 MEQ PO TBCR
40.0000 meq | EXTENDED_RELEASE_TABLET | Freq: Once | ORAL | Status: AC
  Administered 2022-01-26: 40 meq via ORAL
  Filled 2022-01-26: qty 2

## 2022-01-26 MED ORDER — FINASTERIDE 5 MG PO TABS
5.0000 mg | ORAL_TABLET | Freq: Every day | ORAL | Status: DC
Start: 2022-01-26 — End: 2022-01-26
  Filled 2022-01-26 (×2): qty 1

## 2022-01-26 MED ORDER — CARVEDILOL 3.125 MG PO TABS
6.2500 mg | ORAL_TABLET | Freq: Two times a day (BID) | ORAL | Status: DC
Start: 2022-01-26 — End: 2022-01-26

## 2022-01-26 MED ORDER — DICYCLOMINE HCL 20 MG PO TABS: 20.0000 mg | ORAL_TABLET | Freq: Four times a day (QID) | ORAL | Status: DC | PRN

## 2022-01-26 MED ORDER — ONDANSETRON 4 MG PO TBDP: 4.0000 mg | ORAL_TABLET | Freq: Four times a day (QID) | ORAL | Status: DC | PRN

## 2022-01-26 MED ORDER — METOPROLOL TARTRATE 25 MG PO TABS
12.5000 mg | ORAL_TABLET | Freq: Two times a day (BID) | ORAL | Status: DC
  Administered 2022-01-27 – 2022-01-29 (×5): 12.5 mg via ORAL
  Filled 2022-01-26 (×6): qty 1

## 2022-01-26 MED ORDER — LEVETIRACETAM 500 MG PO TABS
500.0000 mg | ORAL_TABLET | Freq: Two times a day (BID) | ORAL | Status: DC
  Administered 2022-01-27 – 2022-01-29 (×5): 500 mg via ORAL
  Filled 2022-01-26 (×6): qty 1

## 2022-01-26 MED ORDER — CLONIDINE HCL 0.1 MG PO TABS: 0.1000 mg | ORAL_TABLET | Freq: Every day | ORAL | Status: DC

## 2022-01-26 MED ORDER — METHOCARBAMOL 500 MG PO TABS: 500.0000 mg | ORAL_TABLET | Freq: Three times a day (TID) | ORAL | Status: DC | PRN

## 2022-01-26 MED ORDER — CARVEDILOL 3.125 MG PO TABS
6.2500 mg | ORAL_TABLET | Freq: Two times a day (BID) | ORAL | Status: DC
  Administered 2022-01-27 – 2022-01-29 (×5): 6.25 mg via ORAL
  Filled 2022-01-26 (×5): qty 2

## 2022-01-26 MED ORDER — AMLODIPINE BESYLATE 10 MG PO TABS
10.0000 mg | ORAL_TABLET | Freq: Every day | ORAL | Status: DC
Start: 2022-01-26 — End: 2022-01-26
  Administered 2022-01-26: 10 mg via ORAL
  Filled 2022-01-26: qty 1

## 2022-01-26 MED ORDER — TAMSULOSIN HCL 0.4 MG PO CAPS
0.4000 mg | ORAL_CAPSULE | Freq: Every day | ORAL | Status: DC | PRN
Start: 2022-01-26 — End: 2022-01-26

## 2022-01-26 MED ORDER — CLONIDINE HCL 0.1 MG PO TABS: 0.1000 mg | ORAL_TABLET | ORAL | Status: DC

## 2022-01-26 MED ORDER — AMLODIPINE BESYLATE 10 MG PO TABS
10.0000 mg | ORAL_TABLET | Freq: Every day | ORAL | Status: DC
Start: 2022-01-26 — End: 2022-01-29
  Administered 2022-01-27 – 2022-01-29 (×3): 10 mg via ORAL
  Filled 2022-01-26 (×4): qty 1

## 2022-01-26 MED ORDER — ADULT MULTIVITAMIN W/MINERALS CH
1.0000 | ORAL_TABLET | Freq: Every day | ORAL | Status: DC
  Administered 2022-01-26: 1 via ORAL
  Filled 2022-01-26 (×3): qty 1

## 2022-01-26 MED ORDER — MAGNESIUM HYDROXIDE 400 MG/5ML PO SUSP
30.0000 mL | Freq: Every day | ORAL | Status: DC | PRN
Start: 2022-01-26 — End: 2022-01-26

## 2022-01-26 MED ORDER — ASPIRIN 81 MG PO TBEC
81.0000 mg | DELAYED_RELEASE_TABLET | Freq: Every day | ORAL | Status: DC
Start: 2022-01-26 — End: 2022-01-26
  Administered 2022-01-26: 81 mg via ORAL
  Filled 2022-01-26: qty 1

## 2022-01-26 MED ORDER — INSULIN GLARGINE-LIXISENATIDE 100-33 UNT-MCG/ML ~~LOC~~ SOPN: 60.0000 [IU] | PEN_INJECTOR | Freq: Every day | SUBCUTANEOUS | Status: DC

## 2022-01-26 MED ORDER — PANTOPRAZOLE SODIUM 40 MG PO TBEC
40.0000 mg | DELAYED_RELEASE_TABLET | Freq: Every day | ORAL | Status: DC
  Administered 2022-01-26: 40 mg via ORAL
  Filled 2022-01-26: qty 1

## 2022-01-26 MED ORDER — HYDROXYZINE HCL 25 MG PO TABS: 25.0000 mg | ORAL_TABLET | Freq: Four times a day (QID) | ORAL | Status: DC | PRN

## 2022-01-26 MED ORDER — GLUCAGON 1 MG/0.2ML ~~LOC~~ SOAJ: 1.0000 | Freq: Every day | SUBCUTANEOUS | Status: DC | PRN

## 2022-01-26 MED ORDER — CENTRUM PO CHEW
1.0000 | CHEWABLE_TABLET | Freq: Every day | ORAL | Status: DC
  Filled 2022-01-26 (×2): qty 1

## 2022-01-26 MED ORDER — LOPERAMIDE HCL 2 MG PO CAPS: 2.0000 mg | ORAL_CAPSULE | ORAL | Status: DC | PRN

## 2022-01-26 MED ORDER — FINASTERIDE 5 MG PO TABS
5.0000 mg | ORAL_TABLET | Freq: Every day | ORAL | Status: DC
  Administered 2022-01-27 – 2022-01-29 (×3): 5 mg via ORAL
  Filled 2022-01-26 (×5): qty 1

## 2022-01-26 MED ORDER — NAPROXEN 500 MG PO TABS: 500.0000 mg | ORAL_TABLET | Freq: Two times a day (BID) | ORAL | Status: DC | PRN

## 2022-01-26 NOTE — ED Notes (Signed)
Pt d/c from facility to go have labs collected at the ER via safe transport.

## 2022-01-26 NOTE — ED Notes (Signed)
Patient A&Ox4. Denies intent to harm self/others when asked. Denies A/VH. Patient denies any physical complaints when asked. No acute distress noted. Support and encouragement provided. Routine safety checks conducted according to facility protocol. Encouraged patient to notify staff if thoughts of harm toward self or others arise. Patient verbalize understanding and agreement. Will continue to monitor for safety.    

## 2022-01-26 NOTE — ED Notes (Signed)
Pt admitted to [facility] OBS unit while awaiting bed at [facility]. Patient reports he has long standing substance abuse and has been using hydrocodone '' buying them from the lady I was staying at , but I used to use heroine when I was younger. '' Patient denies any SI or HI . He reports he has been experiencing withdrawal symptoms and states '' I have had diarrhea. I feel dehydrated and weak with the chills. '' No signs pt is responding to internal stimuli. Skin searched and noted old scars to back and long scars to bilateral knees where patient reports are surgical scars. No wounds noted. No contraband found. Attempted PIV blood draw x 2 flash received but no flow for blood sample. Provider notified and will con't to push po fluids. Pt is safe. Will con't to monitor.

## 2022-01-26 NOTE — BH Assessment (Signed)
Comprehensive Clinical Assessment (CCA) Note  01/26/2022 Bradley Hull [id number]  Chief Complaint:  Chief Complaint  Patient presents with   Addiction Problem    Bradley Hull 71 year old present to [facility] with request for detox off opioid (hydrocodone). When asked how many pills he takes per day he stated, "a month ago I got a 170 hydrocodone '10mg'$  and they were gone in less than 10 days). He states he also buys them off the street. He takes the hydrocodone orally with beer. Denied suicidal/homicidal ideations, and denied auditory/visual hallucinations. Report he relapse on cocaine the other day after been clean for a year.    Visit Diagnosis:  Substance induced mood disorder (HCC)  Opioid withdrawal (HCC)  Opioid use disorder, severe, dependence ([place])      CCA Screening, Triage and Referral (STR)  Patient Reported Information How did you hear about [place]? No data recorded What Is the Reason for Your Visit/Call Today? Bradley Hull 71 year old present to [facility] with request for detox off opioid (hydrocodone). When asked how many pills he takes per day he stated, "a month ago I got a 170 hydrocodone '10mg'$  and they were gone in less than 10 days). He states he also buys them off the street. He takes the hydrocodone orally with beer. Denied suicidal/homicidal ideations, and denied auditory/visual hallucinations. Report he relapse on cocaine the other day after been clean for a year. Report he last had a pill on Sunday January 23, 2022 (4 pills). 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  Patient reports that he moved back to [place] on Saturday after having a confrontation with a lady he has been living with in [place]. Patient reports he called his sister to pick him up and now he is living with his sister. Patient  reports history of rehab treatment at [facility] years ago but he has not received any services for his drug problem or mood. Patient denies any psychiatric treatment and he does not have outpatient services.  Patient reports feeling depressed and reports having nightmares at night causing him to fall out the bed. Patient reports several medical issues to include history of stroke, prostate cancer, knee replacements, cirrhosis of the live and hepatitis C which he reports is not active due to taking medications for it. Patient is now retired and on disability living with his sister temporary. Patient reports stressors of his housing, health, relationship and substance use. Patient is oriented x4, engaged, alert and cooperative during assessment. Patient eye contact and speech is clear, his affect is appropriate with congruent mood. Patient denies SI, HI, AVH, SIB.   How Long Has This Been Causing You Problems? <Week  What Do You Feel Would Help You the Most Today? Alcohol or Drug Use Treatment   Have You Recently Had Any Thoughts About Hurting Yourself? No  Are You Planning to Commit Suicide/Harm Yourself At This time? No   Have you Recently Had Thoughts About [place]? No  Are You Planning to Harm Someone at This Time? No  Explanation: No data recorded  Have You Used Any Alcohol or Drugs in the Past 24 Hours? No  How Long Ago Did You Use Drugs or Alcohol? No data recorded What Did You Use and How Much? No data recorded  Do You Currently Have a Therapist/Psychiatrist? No  Name of Therapist/Psychiatrist: No data recorded  Have You Been Recently Discharged From Any Office Practice or Programs? No  Explanation of Discharge From Practice/Program: No data recorded    CCA Screening Triage Referral Assessment Type of Contact: Face-to-Face  Telemedicine Service Delivery:   Is this Initial or Reassessment? No data recorded Date Telepsych consult ordered in [facility]:   No data recorded Time Telepsych consult ordered in [facility]:  No data recorded Location of Assessment: [facility]  Provider Location: [facility] Assessment Services   Collateral Involvement: none   Does Patient Have a [place]? No data recorded Name and Contact of Legal Guardian: No data recorded If Minor and Not Living with Parent(s), Who has Custody? No data recorded Is CPS involved or ever been involved? No data recorded Is APS involved or ever been involved? No data recorded  Patient Determined To Be At Risk for Harm To Self or Others Based on Review of Patient Reported Information or Presenting Complaint? No  Method: No data recorded Availability of Means: No data recorded Intent: No data recorded Notification Required: No data recorded Additional Information for Danger to Others Potential: No data recorded Additional Comments for Danger to Others Potential: No data recorded Are There Guns or Other Weapons in Your Home? No data recorded Types of Guns/Weapons: No data recorded Are These Weapons Safely Secured?                            No data recorded Who Could Verify You Are Able To Have These Secured: No data recorded Do You Have any Outstanding Charges, Pending Court Dates, Parole/Probation? No data recorded Contacted To Inform of Risk of Harm To Self or Others: No data recorded   Does Patient Present under Involuntary Commitment? No  IVC Papers Initial File Date: No data recorded  [place] of Residence: [place]   Patient Currently Receiving the Following Services: Not Receiving Services   Determination of Need: Routine (7 days)   Options For Referral: Facility-Based Crisis     CCA Biopsychosocial Patient Reported Schizophrenia/Schizoaffective Diagnosis in Past: No   Strengths: No data recorded  Mental Health Symptoms Depression:   Irritability; Change in energy/activity   Duration of Depressive symptoms:    Mania:    None   Anxiety:    Worrying; Tension   Psychosis:   None   Duration of Psychotic symptoms:    Trauma:   None   Obsessions:   None   Compulsions:   None   Inattention:   None   Hyperactivity/Impulsivity:   None   Oppositional/Defiant Behaviors:   None   Emotional Irregularity:   None   Other Mood/Personality Symptoms:  No data recorded   Mental Status Exam Appearance and self-care  Stature:   Average   Weight:   Overweight   Clothing:   Age-appropriate   Grooming:   Normal   Cosmetic use:   None   Posture/gait:   Normal   Motor activity:   Not Remarkable   Sensorium  Attention:   Normal   Concentration:   Normal   Orientation:   Person; Place; Situation   Recall/memory:   Normal   Affect and Mood  Affect:   Appropriate   Mood:   Euthymic   Relating  Eye contact:   Normal   Facial expression:   Responsive   Attitude toward examiner:   Cooperative   Thought and Language  Speech flow:  Clear and Coherent  Thought content:   Appropriate to Mood and Circumstances   Preoccupation:   None   Hallucinations:   None   Organization:  No data recorded  [employer] of Knowledge:   Fair   Intelligence:   Average   Abstraction:   Normal   Judgement:   Fair   Art therapist:   Adequate   Insight:   Good   Decision Making:   Normal   Social Functioning  Social Maturity:   Responsible   Social Judgement:   Normal   Stress  Stressors:   Housing; Relationship; Illness   Coping Ability:   Overwhelmed   Skill Deficits:   None   Supports:   Family     Religion:    Leisure/Recreation:    Exercise/Diet:     CCA Employment/Education Employment/Work Situation: Employment / Work Nurse, 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 Why is Patient on Disability: physical health Patient's Job has Been Impacted by Current Illness: No  Education: Education Is Patient Currently  Attending School?: No Did Physicist, medical?: No   CCA Family/Childhood History Family and Relationship History: Family history Marital status: Divorced Does patient have children?: Yes How many children?: 2  Childhood History:  Childhood History Did patient suffer any verbal/emotional/physical/sexual abuse as a child?: No Did patient suffer from severe childhood neglect?: No Has patient ever been sexually abused/assaulted/raped as an adolescent or adult?: No Was the patient ever a victim of a crime or a disaster?: No Witnessed domestic violence?: No Has patient been affected by domestic violence as an adult?: No  Child/Adolescent Assessment:     CCA Substance Use Alcohol/Drug Use: Alcohol / Drug Use Pain Medications: See MAR Prescriptions: See MAR Over the Counter: See MAR History of alcohol / drug use?: Yes Substance #1 Name of Substance 1: Opioid Substance #2 Name of Substance 2: Alcohol 2 - Age of First Use: UTA 2 - Frequency: 2-40 oz 2 - Duration: ongoing 2 - Last Use / Amount: UTA 2 - Method of Aquiring: UTA 2 - Route of Substance Use: UTA                     ASAM's:  Six Dimensions of Multidimensional Assessment  Dimension 1:  Acute Intoxication and/or Withdrawal Potential:      Dimension 2:  Biomedical Conditions and Complications:      Dimension 3:  Emotional, Behavioral, or Cognitive Conditions and Complications:     Dimension 4:  Readiness to Change:     Dimension 5:  Relapse, Continued use, or Continued Problem Potential:     Dimension 6:  Recovery/Living Environment:     ASAM Severity Score:    ASAM Recommended Level of Treatment: ASAM Recommended Level of Treatment: Level II Intensive Outpatient Treatment   Substance use Disorder (SUD)    Recommendations for Services/Supports/Treatments: Recommendations for Services/Supports/Treatments Recommendations For Services/Supports/Treatments: Facility Based Crisis  Discharge  Disposition:    DSM5 Diagnoses: Patient Active Problem List   Diagnosis Date Noted   Opioid use disorder, severe, dependence ([place]) 01/26/2022   Opioid withdrawal ([place]) 01/26/2022   Substance induced mood disorder ([place]) 01/26/2022   Osteoarthritis of both knees 10/11/2021   Tobacco abuse 07/29/2021   Class 2 severe obesity due to excess calories with serious comorbidity and body mass index (BMI) of 37.0 to 37.9 in adult [facility]) 07/27/2021   Insulin-requiring or dependent type II diabetes mellitus ([place]) 02/10/2021   Deficiency anemia 12/31/2020   Gynecomastia, male 11/13/2020   Essential hypertension  11/08/2020   Encounter for general adult medical examination with abnormal findings 09/30/2020   Hyperlipidemia with target LDL less than 100 09/29/2020   Other cirrhosis of liver ([place]) 03/27/2020   Type 2 diabetes mellitus, uncontrolled, with neuropathy 03/27/2020   Hyperlipidemia associated with type 2 diabetes mellitus ([place]) 03/27/2020   Benign prostatic hyperplasia with lower urinary tract symptoms 03/27/2020   Full code status 03/27/2020   Alcohol abuse 12/02/2013   Chronic hepatitis C ([place]) 11/14/2013     Referrals to Alternative Service(s): Referred to Alternative Service(s):   Place:   Date:   Time:    Referred to Alternative Service(s):   Place:   Date:   Time:    Referred to Alternative Service(s):   Place:   Date:   Time:    Referred to Alternative Service(s):   Place:   Date:   Time:     Luther Redo, [facility]

## 2022-01-26 NOTE — ED Provider Notes (Signed)
Facility Based Crisis Admission H&P  Date: 01/26/22 Patient Name: Bradley Hull MRN: [id number] Chief Complaint:  Chief Complaint  Patient presents with   Addiction Problem    [name] 71 year old present to [facility] with request for detox off opioid (hydrocodone). When asked how many pills he takes per day he stated, "a month ago I got a 170 hydrocodone '10mg'$  and they were gone in less than 10 days). He states he also buys them off the street. He takes the hydrocodone orally with beer. Denied suicidal/homicidal ideations, and denied auditory/visual hallucinations. Report he relapse on cocaine the other day after been clean for a year.       Diagnoses:  Final diagnoses:  Substance induced mood disorder (HCC)  Opioid withdrawal (HCC)  Opioid use disorder, severe, dependence (HCC)    HPI: Bradley Hull 71 y.o., male patient presented to [facility]) as a walk in voluntarily accompanied by his sister with complaint of opiate withdrawal and requesting assistance with detox and long term rehab facility.    Bradley Hull, 71 y.o., male patient seen face to face by this provider, consulted with Dr. Ernie Hew; and chart reviewed on 01/26/22.  On evaluation Bradley Hull reports he is having some issues with depression and opiate withdrawal. Patient reports for the last three years he has been prescribed pain medication and has also been buying it off the street. Reports his last prescription was June 16th where he filled a prescription for 170 tablets "and I took them within a week." patient reports that he has a history of stroke and currently has blockage of the coronary artery which may cause them some pain at times. Patient reports that he is moving back to [place] from [place], where he was currently staying with a male friend.  "I Have my own place for the last 30 years and this woman asked me to move in with her. My family told me not to do  it but after my stroke she came and helped in eventually I just moved in. She also has a 6 yr. old son living with her. Then it just became about money, and she was selling me her pain medication also I guess as a means just to keep me there. But on Sunday early that morning she started raising a lot of sand I'm saying this shouldn't need anybody in her house. I just called my family to come get me and packed all of my things to move back here. patient denies prior psychiatric history other than substance use. Reports he has never had in/outpatient services, psychotropic medications, suicide attempt, or self-harm. He does report one prior stay every at rehabilitation center [facility] in [place], [place]) about 30 years ago related to heroin use.  Patient states that his family is supportive. During evaluation Bradley Hull is sitting upright in chair with no noted distress.  He is alert, oriented x 4. He is calm, cooperative and attentive.  His mood is dysphoric with congruent affect.  He has normal speech, and behavior.  Objectively there is no evidence of psychosis/mania or delusional thinking.  Patient is able to converse coherently, goal directed thoughts, no distractibility, or pre-occupation.  He denies suicidal/self-harm/homicidal ideation, psychosis, and paranoia.  Patient answered question appropriately.     PHQ 2-9:  [place] Office Visit from 07/03/2020 in [place]  Thoughts that you would be better off dead, or of hurting  yourself in some way Not at all  PHQ-9 Total Score 0       Flowsheet Row ED from 12/30/2020 in [place] DEPT Admission (Discharged) from 11/17/2020 in [place] No Risk No Risk        Total Time spent with patient: 30 minutes  Musculoskeletal  Strength & Muscle Tone: within normal limits Gait & Station: normal Patient leans: N/A  Psychiatric  Specialty Exam  Presentation General Appearance: Appropriate for Environment; Casual  Eye Contact:Good  Speech:Clear and Coherent; Normal Rate  Speech Volume:Normal  Handedness:Right   Mood and Affect  Mood:Dysphoric  Affect:Appropriate; Congruent   Thought Process  Thought Processes:Coherent; Goal Directed  Descriptions of Associations:Intact  Orientation:Full (Time, Place and Person)  Thought Content:Logical    Hallucinations:Hallucinations: None  Ideas of Reference:None  Suicidal Thoughts:Suicidal Thoughts: No  Homicidal Thoughts:Homicidal Thoughts: No   Sensorium  Memory:Immediate Good; Recent Good; Remote Good  Judgment:Intact  Insight:Present   Executive Functions  Concentration:Good  Attention Span:Good  [place] of Knowledge:Good  Language:Good   Psychomotor Activity  Psychomotor Activity:Psychomotor Activity: Normal   Assets  Assets:Communication Skills; Desire for Improvement; Financial Resources/Insurance; Housing; Leisure Time; Resilience; Social Support   Sleep  Sleep:Sleep: Good   Nutritional Assessment (For OBS and FBC admissions only) Has the patient had a weight loss or gain of 10 pounds or more in the last 3 months?: No Has the patient had a decrease in food intake/or appetite?: No Does the patient have dental problems?: No Does the patient have eating habits or behaviors that may be indicators of an eating disorder including binging or inducing vomiting?: No Has the patient recently lost weight without trying?: 0 Has the patient been eating poorly because of a decreased appetite?: 0 Malnutrition Screening Tool Score: 0    Physical Exam Vitals and nursing note reviewed. Exam conducted with a chaperone present.  Constitutional:      General: He is not in acute distress.    Appearance: Normal appearance. He is not ill-appearing.  Cardiovascular:     Rate and Rhythm: Normal rate.  Pulmonary:     Effort:  Pulmonary effort is normal.  Musculoskeletal:        General: Normal range of motion.     Cervical back: Normal range of motion.  Skin:    General: Skin is warm and dry.  Neurological:     Mental Status: He is alert and oriented to person, place, and time.  Psychiatric:        Attention and Perception: Attention and perception normal. He does not perceive auditory or visual hallucinations.        Mood and Affect: Affect normal. Mood is depressed.        Speech: Speech normal.        Behavior: Behavior normal. Behavior is cooperative.        Thought Content: Thought content is not paranoid or delusional. Thought content does not include homicidal or suicidal ideation.        Cognition and Memory: Cognition normal.        Judgment: Judgment normal.    Review of Systems  Constitutional:  Negative for diaphoresis.  HENT: Negative.    Eyes: Negative.   Respiratory:  Negative for cough and shortness of breath.   Cardiovascular: Negative.  Negative for chest pain, palpitations and leg swelling.  Gastrointestinal:  Positive for diarrhea (Related withdrawal). Negative for nausea.  Genitourinary:  Negative for dysuria and frequency.  Reports he does have stress incontinence   Musculoskeletal: Negative.   Skin: Negative.   Neurological:  Negative for dizziness, seizures, loss of consciousness and headaches.  Psychiatric/Behavioral:  Positive for substance abuse. Negative for hallucinations. Depression: Stable. Suicidal ideas: Opiates, recently used cocaine with brother.The patient does not have insomnia. Nervous/anxious: Stable.       Reports heroin use about 30 yrs ago went to rehab and clean up until knee surgery and addiction to pain medication.  Now getting a prescription and buying off street    Blood pressure 134/74, pulse 73, temperature 99.1 F (37.3 C), temperature source Oral, resp. rate 18, SpO2 100 %. There is no height or weight on file to calculate BMI.  Past Psychiatric  History: Substance use disorder    Is the patient at risk to self? No  Has the patient been a risk to self in the past 6 months? No .    Has the patient been a risk to self within the distant past? No   Is the patient a risk to others? No   Has the patient been a risk to others in the past 6 months? No   Has the patient been a risk to others within the distant past? No   Past Medical History:  Past Medical History:  Diagnosis Date   Arthritis    Benign prostatic hyperplasia    Cirrhosis ([place])    Colon polyps    Complication of anesthesia    bp droppped and heart rate sped up after 09-09-2020 endoscopy /colonscopy   DM type 2 (diabetes mellitus, type 2) ([place])    Hepatitis C    took treatment for 2018 issue resolved per pt   History of kidney stones    Hypertension    Nicotine addiction    Obesity    Osteoarthritis of both knees    Prostate cancer ([place])    Renal lithiasis     Past Surgical History:  Procedure Laterality Date   COLONOSCOPY  09/09/2020   [facility]   CYSTOSCOPY  11/17/2020   Procedure: CYSTOSCOPY FLEXIBLE;  Surgeon: Irine Seal, MD;  Location: [facility];  Service: Urology;;   ESOPHAGOGASTRODUODENOSCOPY  2016   KIDNEY STONE SURGERY Right yrs ago   LITHOTRIPSY Left 1986   MULTIPLE EXTRACTIONS WITH ALVEOLOPLASTY N/A 09/09/2016   Procedure: MULTIPLE EXTRACTION WITH ALVEOLOPLASTY - one, two, six, seven, nine, ten, eleven, 58, 37, twenty, twenty-one, twenty-two, twenty-four, twenty-five, twenty-six, twenty-seven, twenty-eight, twenty-nine, thirty-one, thirty-two; alveolplasty; removal bilateral mandible turi; removal cyst right mandible;  bone graft;  Surgeon: [name], DDS;  Location: [facility];  Service: Oral S   PILONIDAL CYST EXCISION  yrs ago   from spine   SPACE OAR INSTILLATION N/A 11/17/2020   Procedure: SPACE OAR INSTILLATION FIDUCIAL MARKER;  Surgeon: Irine Seal, MD;  Location: [facility];  Service: Urology;   Laterality: N/A;  prostate   TOTAL KNEE ARTHROPLASTY Bilateral 03/2020   UPPER GASTROINTESTINAL ENDOSCOPY  08/30/2020   [facility]    WISDOM TOOTH EXTRACTION     x4 removed    Family History:  Family History  Problem Relation Age of Onset   Lung cancer Mother        mets   Diabetes Mother    Breast cancer Mother    Diabetes Father    Hyperlipidemia Father    Colon cancer Father    Diabetes Sister    Hepatitis Brother    Schizophrenia Brother    Diabetes Sister  Diabetes Sister    Diabetes Brother    Diabetes Brother    Diabetes Maternal Grandmother    Esophageal cancer Neg Hx    Rectal cancer Neg Hx    Stomach cancer Neg Hx     Social History:  Social History   Socioeconomic History   Marital status: Single    Spouse name: Not on file   Number of children: 2   Years of education: Not on file   Highest education level: Not on file  Occupational History   Occupation: retired  Tobacco Use   Smoking status: Some Days    Packs/day: 0.25    Years: 51.00    Total pack years: 12.75    Types: Cigarettes   Smokeless tobacco: Never   Tobacco comments:    Pt smokes 1 pack every other day  Vaping Use   Vaping Use: Never used  Substance and Sexual Activity   Alcohol use: Yes    Comment: occ   Drug use: No   Sexual activity: Yes  Other Topics Concern   Not on file  Social History Narrative   Not on file   Social Determinants of Health   Financial Resource Strain: Low Risk  (05/17/2021)   Overall Financial Resource Strain (CARDIA)    Difficulty of Paying Living Expenses: Not hard at all  Food Insecurity: No Food Insecurity (05/17/2021)   Hunger Vital Sign    Worried About Running Out of Food in the Last Year: Never true    [place] in the Last Year: Never true  Transportation Needs: No Transportation Needs (05/17/2021)   PRAPARE - Hydrologist (Medical): No    Lack of Transportation (Non-Medical): No  Physical Activity:  Insufficiently Active (05/17/2021)   Exercise Vital Sign    Days of Exercise per Week: 2 days    Minutes of Exercise per Session: 30 min  Stress: No Stress Concern Present (05/17/2021)   [place]    Feeling of Stress : Only a little  Social Connections: Moderately Isolated (05/17/2021)   Social Connection and Isolation Panel [NHANES]    Frequency of Communication with Friends and Family: Three times a week    Frequency of Social Gatherings with Friends and Family: Three times a week    Attends Religious Services: 1 to 4 times per year    Active Member of Clubs or Organizations: No    Attends Archivist Meetings: Never    Marital Status: Never married  Intimate Partner Violence: Not At Risk (05/17/2021)   Humiliation, Afraid, Rape, and Kick questionnaire    Fear of Current or Ex-Partner: No    Emotionally Abused: No    Physically Abused: No    Sexually Abused: No    SDOH:  SDOH Screenings   Alcohol Screen: Low Risk  (05/17/2021)   Alcohol Screen    Last Alcohol Screening Score (AUDIT): 3  Depression (PHQ2-9): Low Risk  (05/17/2021)   Depression (PHQ2-9)    PHQ-2 Score: 0  Financial Resource Strain: Low Risk  (05/17/2021)   Overall Financial Resource Strain (CARDIA)    Difficulty of Paying Living Expenses: Not hard at all  Food Insecurity: No Food Insecurity (05/17/2021)   Hunger Vital Sign    Worried About Running Out of Food in the Last Year: Never true    [place] in the Last Year: Never true  Housing: Low Risk  (05/17/2021)  Housing    Last Housing Risk Score: 0  Physical Activity: Insufficiently Active (05/17/2021)   Exercise Vital Sign    Days of Exercise per Week: 2 days    Minutes of Exercise per Session: 30 min  Social Connections: Moderately Isolated (05/17/2021)   Social Connection and Isolation Panel [NHANES]    Frequency of Communication with Friends and Family: Three times  a week    Frequency of Social Gatherings with Friends and Family: Three times a week    Attends Religious Services: 1 to 4 times per year    Active Member of Clubs or Organizations: No    Attends Archivist Meetings: Never    Marital Status: Never married  Stress: No Stress Concern Present (05/17/2021)   [place]    Feeling of Stress : Only a little  Tobacco Use: High Risk (01/26/2022)   Patient History    Smoking Tobacco Use: Some Days    Smokeless Tobacco Use: Never    Passive Exposure: Not on file  Transportation Needs: No Transportation Needs (05/17/2021)   PRAPARE - Transportation    Lack of Transportation (Medical): No    Lack of Transportation (Non-Medical): No    Last Labs:  No visits with results within 6 Month(s) from this visit.  Latest known visit with results is:  Office Visit on 07/27/2021  Component Date Value Ref Range Status   PSA 05/04/2021 0.28   Final   Microalb, Ur 07/27/2021 4.9 (H)  0.0 - 1.9 mg/dL Final   Creatinine,U 07/27/2021 288.1  mg/dL Final   Microalb Creat Ratio 07/27/2021 1.7  0.0 - 30.0 mg/g Final   Color, Urine 07/27/2021 YELLOW  Yellow;Lt. Yellow;Straw;Dark Yellow;Amber;Green;Red;Brown Final   APPearance 07/27/2021 Cloudy (A)  Clear;Turbid;Slightly Cloudy;Cloudy Final   Specific Gravity, Urine 07/27/2021 1.025  1.000 - 1.030 Final   pH 07/27/2021 6.0  5.0 - 8.0 Final   Total Protein, Urine 07/27/2021 TRACE (A)  Negative Final   Urine Glucose 07/27/2021 NEGATIVE  Negative Final   Ketones, ur 07/27/2021 TRACE (A)  Negative Final   Bilirubin Urine 07/27/2021 NEGATIVE  Negative Final   Hgb urine dipstick 07/27/2021 TRACE-INTACT (A)  Negative Final   Urobilinogen, UA 07/27/2021 1.0  0.0 - 1.0 Final   Leukocytes,Ua 07/27/2021 NEGATIVE  Negative Final   Nitrite 07/27/2021 NEGATIVE  Negative Final   WBC, UA 07/27/2021 0-2/hpf  0-2/hpf Final   RBC / HPF 07/27/2021 0-2/hpf   0-2/hpf Final   Squamous Epithelial / LPF 07/27/2021 Rare(0-4/hpf)  Rare(0-4/hpf) Final   Ca Oxalate Crys, UA 07/27/2021 Presence of (A)  None Final   Amorphous 07/27/2021 Present (A)  None;Present Final   Total Bilirubin 07/27/2021 1.2  0.2 - 1.2 mg/dL Final   Bilirubin, Direct 07/27/2021 0.2  0.0 - 0.3 mg/dL Final   Alkaline Phosphatase 07/27/2021 79  39 - 117 U/L Final   AST 07/27/2021 31  0 - 37 U/L Final   ALT 07/27/2021 32  0 - 53 U/L Final   Total Protein 07/27/2021 7.9  6.0 - 8.3 g/dL Final   Albumin 07/27/2021 4.5  3.5 - 5.2 g/dL Final   Hgb A1c MFr Bld 07/27/2021 7.1 (H)  4.6 - 6.5 % Final   Glycemic Control Guidelines for People with Diabetes:Non Diabetic:  <6%Goal of Therapy: <7%Additional Action Suggested:  >8%    WBC 07/27/2021 6.8  4.0 - 10.5 K/uL Final   RBC 07/27/2021 4.81  4.22 - 5.81 Mil/uL Final  Hemoglobin 07/27/2021 13.4  13.0 - 17.0 g/dL Final   HCT 07/27/2021 41.1  39.0 - 52.0 % Final   MCV 07/27/2021 85.4  78.0 - 100.0 fl Final   MCHC 07/27/2021 32.6  30.0 - 36.0 g/dL Final   RDW 07/27/2021 16.3 (H)  11.5 - 15.5 % Final   Platelets 07/27/2021 336.0  150.0 - 400.0 K/uL Final   Neutrophils Relative % 07/27/2021 71.5  43.0 - 77.0 % Final   Lymphocytes Relative 07/27/2021 19.4  12.0 - 46.0 % Final   Monocytes Relative 07/27/2021 5.9  3.0 - 12.0 % Final   Eosinophils Relative 07/27/2021 2.7  0.0 - 5.0 % Final   Basophils Relative 07/27/2021 0.5  0.0 - 3.0 % Final   Neutro Abs 07/27/2021 4.9  1.4 - 7.7 K/uL Final   Lymphs Abs 07/27/2021 1.3  0.7 - 4.0 K/uL Final   Monocytes Absolute 07/27/2021 0.4  0.1 - 1.0 K/uL Final   Eosinophils Absolute 07/27/2021 0.2  0.0 - 0.7 K/uL Final   Basophils Absolute 07/27/2021 0.0  0.0 - 0.1 K/uL Final   Cholesterol 07/27/2021 76  0 - 200 mg/dL Final   ATP III Classification       Desirable:  < 200 mg/dL               Borderline High:  200 - 239 mg/dL          High:  > = 240 mg/dL   Triglycerides 07/27/2021 109.0  0.0 - 149.0 mg/dL  Final   Normal:  <150 mg/dLBorderline High:  150 - 199 mg/dL   HDL 07/27/2021 37.80 (L)  >39.00 mg/dL Final   VLDL 07/27/2021 21.8  0.0 - 40.0 mg/dL Final   LDL Cholesterol 07/27/2021 16  0 - 99 mg/dL Final   Total CHOL/HDL Ratio 07/27/2021 2   Final                  Men          Women1/2 Average Risk     3.4          3.3Average Risk          5.0          4.42X Average Risk          9.6          7.13X Average Risk          15.0          11.0                       NonHDL 07/27/2021 37.89   Final   NOTE:  Non-HDL goal should be 30 mg/dL higher than patient's LDL goal (i.e. LDL goal of < 70 mg/dL, would have non-HDL goal of < 100 mg/dL)   Sodium 07/27/2021 138  135 - 145 mEq/L Final   Potassium 07/27/2021 3.6  3.5 - 5.1 mEq/L Final   Chloride 07/27/2021 101  96 - 112 mEq/L Final   CO2 07/27/2021 25  19 - 32 mEq/L Final   Glucose, Bld 07/27/2021 127 (H)  70 - 99 mg/dL Final   BUN 07/27/2021 7  6 - 23 mg/dL Final   Creatinine, Ser 07/27/2021 0.76  0.40 - 1.50 mg/dL Final   GFR 07/27/2021 92.40  >60.00 mL/min Final   Calculated using the CKD-EPI Creatinine Equation (2021)   Calcium 07/27/2021 10.3  8.4 - 10.5 mg/dL Final    Allergies: Dilaudid [hydromorphone hcl]  PTA Medications: (Not in a hospital admission)   Long Term Goals: Improvement in symptoms so as ready for discharge  Short Term Goals: Patient will verbalize feelings in meetings with treatment team members., Patient will attend at least of 50% of the groups daily., Pt will complete the PHQ9 on admission, day 3 and discharge., Patient will participate in completing the [place], Patient will score a low risk of violence for 24 hours prior to discharge, and Patient will take medications as prescribed daily.  Medical Decision Making  Bradley Hull was [facility]  for Substance induced mood disorder [facility]), opiate withdrawal/detox,  crisis management, and stabilization. Routine labs ordered, which include  Lab Orders         Resp Panel by RT-PCR (Flu A&B, Covid) Anterior Nasal Swab         CBC with Differential/Platelet         Comprehensive metabolic panel         Hemoglobin A1c         Magnesium         Ethanol         Lipid panel         TSH         Urinalysis, Routine w reflex microscopic Urine, Clean Catch         POCT Urine Drug Screen - (I-Screen)    Medication Management: Medications started  amLODipine  10 mg Oral Daily   aspirin EC  81 mg Oral Daily   atorvastatin  80 mg Oral QHS   carvedilol  6.25 mg Oral BID WC   cloNIDine  0.1 mg Oral QID   Followed by   Derrill Memo ON 01/28/2022] cloNIDine  0.1 mg Oral BH-qamhs   Followed by   Derrill Memo ON 01/31/2022] cloNIDine  0.1 mg Oral QAC breakfast   finasteride  5 mg Oral Daily   insulin aspart  0-9 Units Subcutaneous TID WC   Insulin Glargine-Lixisenatide  60 Units Subcutaneous Daily   levETIRAcetam  500 mg Oral BID   metoprolol tartrate  12.5 mg Oral BID   multivitamin with minerals  1 tablet Oral Daily   pantoprazole  40 mg Oral Daily    Will maintain observation checks every 15 minutes for safety. Psychosocial education regarding relapse prevention and self-care; social and communication  Social work will consult with family for collateral information and discuss discharge and follow up plan.     Recommendations  Based on my evaluation the patient does not appear to have an emergency medical condition.  Dayton Kenley, NP 01/26/22  12:33 PM

## 2022-01-26 NOTE — ED Provider Triage Note (Signed)
Emergency Medicine Provider Triage Evaluation Note  Bradley Hull , a 71 y.o. male  was evaluated in triage.  Pt complains of sent from behavioral health urgent care for labs.  They attempted to draw labs x3 times at the facility but sent him to the ER as they were unsuccessful.  He presented there for addiction problem.  Review of Systems  Positive: None Negative: None  Physical Exam  BP 119/66 (BP Location: Right Arm)   Pulse 75   Temp 98.4 F (36.9 C) (Oral)   Resp 16   Ht '5\' 5"'$  (1.651 m)   Wt 102.2 kg   SpO2 100%   BMI 37.49 kg/m  Gen:   Awake, no distress   Resp:  Normal effort  MSK:   Moves extremities without difficulty  Other:    Medical Decision Making  Medically screening exam initiated at 4:31 PM.  Appropriate orders placed.  Bradley Hull was informed that the remainder of the evaluation will be completed by another provider, this initial triage assessment does not replace that evaluation, and the importance of remaining in the ED until their evaluation is complete.  We will order medical clearance labs   Delia Heady, PA-C 01/26/22 1610

## 2022-01-26 NOTE — ED Notes (Signed)
Patient denying SI, HI, AVH - presently in room and says he is cold - blankets provided - does not want daily meds at this time - will reschedule them for night administration - will continue to monitor for safety

## 2022-01-26 NOTE — Progress Notes (Signed)
   01/26/22 1138  [place] (Walk-ins at [facility] only)  What Is the Reason for Your Visit/Call Today? Bradley Hull 71 year old present to [facility] with request for detox off opioid (hydrocodone). When asked how many pills he takes per day he stated, "a month ago I got a 170 hydrocodone '10mg'$  and they were gone in less than 10 days). He states he also buys them off the street. He takes the hydrocodone orally with beer. Denied suicidal/homicidal ideations, and denied auditory/visual hallucinations. Report he relapse on cocaine the other day after been clean for a year. Report he last had a pill on Sunday January 23, 2022 (4 pills). 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  How Long Has This Been Causing You Problems? <Week  Have You Recently Had Any Thoughts About Hurting Yourself? No  Are You Planning to Commit Suicide/Harm Yourself At This time? No  Have you Recently Had Thoughts About [place]? No  Are You Planning To Harm Someone At This Time? No  Are you currently experiencing any auditory, visual or other hallucinations? No  Have You Used Any Alcohol or Drugs in the Past 24 Hours? No  Do you have any current medical co-morbidities that require immediate attention? Yes  Please describe current medical co-morbidities that require immediate attention: Health conditions, diabetic, high blood pressure, take insuline, reported had a stoke November 2022 andcarotid artery  What Do You Feel Would Help You the Most Today? Alcohol or Drug Use Treatment  If access to [facility] was not available, would you have sought care in the Emergency Department? Yes  Determination of Need Routine (7 days)  Options For Referral Inpatient Hospitalization

## 2022-01-26 NOTE — ED Notes (Signed)
Patient refusing blood work at this time he said "that's why they sent me to the hospital in the first place. I don't want it drawn here." - provider made aware that the only ones that can't be run off of blood in lab currently is the TSH and PT/INR

## 2022-01-26 NOTE — ED Notes (Signed)
Patient in room and calm no sxs of distress noted - asked if he wanted a nicotine patch because he mentioned smoking. He said not at this time - will continue to monitor for safety

## 2022-01-26 NOTE — ED Notes (Signed)
Pt arrived to the unit today

## 2022-01-26 NOTE — ED Notes (Signed)
Provider attempted blood draw as well as 2 additional RN. Patient blood draw unsucessful despite po fluids and meal.  CN at [facility] ED notified by provider to request blood work and to send patient back for admission to [facility].

## 2022-01-26 NOTE — ED Triage Notes (Cosign Needed)
The pt reports that he was sent here from the crisis center across the street  he was there for opiate addiction  they attempted to get his blood but were unable after multiple sticks  they sent him here to get his blood drawn

## 2022-01-27 DIAGNOSIS — F1920 Other psychoactive substance dependence, uncomplicated: Secondary | ICD-10-CM | POA: Diagnosis not present

## 2022-01-27 LAB — GLUCOSE, CAPILLARY
Glucose-Capillary: 136 mg/dL — ABNORMAL HIGH (ref 70–99)
Glucose-Capillary: 145 mg/dL — ABNORMAL HIGH (ref 70–99)
Glucose-Capillary: 170 mg/dL — ABNORMAL HIGH (ref 70–99)
Glucose-Capillary: 88 mg/dL (ref 70–99)
Glucose-Capillary: 207 mg/dL — ABNORMAL HIGH (ref 70–99)

## 2022-01-27 MED ORDER — LOPERAMIDE HCL 2 MG PO CAPS: 2.0000 mg | ORAL_CAPSULE | ORAL | Status: DC | PRN

## 2022-01-27 MED ORDER — METHOCARBAMOL 500 MG PO TABS
500.0000 mg | ORAL_TABLET | Freq: Three times a day (TID) | ORAL | Status: DC | PRN
  Administered 2022-01-27: 500 mg via ORAL
  Filled 2022-01-27: qty 1

## 2022-01-27 MED ORDER — NAPROXEN 500 MG PO TABS
500.0000 mg | ORAL_TABLET | Freq: Two times a day (BID) | ORAL | Status: DC | PRN
  Administered 2022-01-27: 500 mg via ORAL
  Filled 2022-01-27: qty 1

## 2022-01-27 MED ORDER — SERTRALINE HCL 25 MG PO TABS
25.0000 mg | ORAL_TABLET | Freq: Every day | ORAL | Status: DC
  Administered 2022-01-27 – 2022-01-29 (×3): 25 mg via ORAL
  Filled 2022-01-27 (×3): qty 1

## 2022-01-27 MED ORDER — HYDROXYZINE HCL 25 MG PO TABS
25.0000 mg | ORAL_TABLET | Freq: Four times a day (QID) | ORAL | Status: DC | PRN
  Administered 2022-01-27: 25 mg via ORAL
  Filled 2022-01-27: qty 1

## 2022-01-27 MED ORDER — NICOTINE 14 MG/24HR TD PT24
14.0000 mg | MEDICATED_PATCH | Freq: Once | TRANSDERMAL | Status: AC
  Administered 2022-01-27: 14 mg via TRANSDERMAL
  Filled 2022-01-27: qty 1

## 2022-01-27 MED ORDER — ADULT MULTIVITAMIN W/MINERALS CH
1.0000 | ORAL_TABLET | Freq: Every day | ORAL | Status: DC
Start: 2022-01-27 — End: 2022-01-29
  Administered 2022-01-27 – 2022-01-29 (×3): 1 via ORAL
  Filled 2022-01-27 (×3): qty 1

## 2022-01-27 MED ORDER — DICYCLOMINE HCL 20 MG PO TABS: 20.0000 mg | ORAL_TABLET | Freq: Four times a day (QID) | ORAL | Status: DC | PRN

## 2022-01-27 MED ORDER — ONDANSETRON 4 MG PO TBDP: 4.0000 mg | ORAL_TABLET | Freq: Four times a day (QID) | ORAL | Status: DC | PRN

## 2022-01-27 MED ORDER — ENSURE ENLIVE PO LIQD
237.0000 mL | Freq: Two times a day (BID) | ORAL | Status: DC
  Administered 2022-01-27 – 2022-01-28 (×3): 237 mL via ORAL

## 2022-01-27 NOTE — ED Notes (Signed)
Pt. Refused vitals.

## 2022-01-27 NOTE — ED Notes (Signed)
Patient resting quietly in bed with eyes closed, Respirations equal and unlabored, skin warm and dry, NAD. Routine safety checks conducted according to facility protocol. Will continue to monitor for safety. 

## 2022-01-27 NOTE — ED Notes (Signed)
Went to go get vitals on Pt. And he was upset that his room did not have a trash can to throw away his diaper. I brought him a paper bag told him I was sorry but that was the only thing we are allowed to use for a trash can as he requested plastic bags. He asked for his pampers from his locker I went to go look and there were none there. He said the lady up front when they came in told them we had pampers for him that he would be fine. Hes upset because we don't have the pampers he needs and he is leaking. He doesn't want to be here and he refused to do anything we have asked him.

## 2022-01-27 NOTE — ED Notes (Signed)
Pt. Refused group and AA tonight.

## 2022-01-27 NOTE — ED Notes (Signed)
Gave the Pt. The blue pads and got his sisters number to call so she could bring him his pampers later today. He didn't get in touch with her so he will try again later. He saids he doesn't know why she didn't leave his stuff here with him but she should be coming with a suitcase of his clothes and stuff.

## 2022-01-27 NOTE — Progress Notes (Signed)
SPIRITUALITY GROUP NOTE  Spirituality group facilitated by Simone Curia, MDiv, [place].  Group Description: Group focused on topic of hope. Patients participated in facilitated discussion around topic, connecting with one another around experiences and definitions for hope. Group members engaged with visual explorer photos, reflecting on what hope looks like for them today. Group engaged in discussion around how their definitions of hope are present today in hospital.  Modalities: Psycho-social ed, Adlerian, Narrative, MI  Patient Progress: Bradley "Jaci Standard" was present throughout group.  Actively engaged in discussion - recalling ways he exercised hope in leaving his living situation with ex-SO. Recalled elements of his faith and upbringing that motivate him.

## 2022-01-27 NOTE — ED Provider Notes (Addendum)
Behavioral Health Progress Note  Date and Time: 01/27/2022 8:10 AM Name: Bradley Hull MRN:  [id number]  Subjective:   Patient seen and assessed at bedside. Patient reports stressors include opiate use, ex-girlfriend gambling, ex-girlfriend "hustling", moving out of girlfriend house last Sunday, exgirlfriend's son's drug dealing. Reports last beer was Monday and hydrocodone this past Sunday. Reports drinking beer 2-3 40 oz per day and regular hydrocodone use. Relapsed on smoking cocaine with brother this past Monday after having not used cocaine for past year.  Reports depressed mood, fatigue, feeling of worthlessness, poor appetite, decreased concentration for several months. Reports having had symptoms of depression in past. Reports symptoms of anxiety.   Denies manic symptoms or psychotic symptoms outside of cocaine use.  Patient reports withdrawal symptoms of dry heaving, diarrhea, abdominal cramping.  Does report sleep has been appropriate; however, does endorse continued lack of appetite.  Amenable to starting Ensure.  Reports mood being "so-so" given stressors.  Denies SI/HI/AVH.  Amenable to going back to sisters house at time of discharge with eventual plan to move in with new roommate on August 1.  Diagnosis:  Final diagnoses:  Substance induced mood disorder ([place])    Total Time spent with patient: 45 minutes   Labs  Lab Results:     Latest Ref Rng & Units 01/26/2022    4:45 PM 07/27/2021   10:03 AM 02/04/2021    1:18 PM  CBC  WBC 4.0 - 10.5 K/uL 7.0  6.8  8.4   Hemoglobin 13.0 - 17.0 g/dL 11.9  13.4  13.0   Hematocrit 39.0 - 52.0 % 36.5  41.1  37.5   Platelets 150 - 400 K/uL 217  336.0  250.0       Latest Ref Rng & Units 01/26/2022    4:45 PM 07/27/2021   10:03 AM 12/30/2020    7:54 PM  CMP  Glucose 70 - 99 mg/dL 182  127  147   BUN 8 - 23 mg/dL '12  7  17   '$ Creatinine 0.61 - 1.24 mg/dL 0.94  0.76  0.59   Sodium 135 - 145 mmol/L 142  138  138   Potassium 3.5 - 5.1  mmol/L 3.3  3.6  3.6   Chloride 98 - 111 mmol/L 109  101  104   CO2 22 - 32 mmol/L '23  25  27   '$ Calcium 8.9 - 10.3 mg/dL 9.7  10.3  9.7   Total Protein 6.5 - 8.1 g/dL 6.9  7.9  7.4   Total Bilirubin 0.3 - 1.2 mg/dL 0.7  1.2  0.7   Alkaline Phos 38 - 126 U/L 85  79  90   AST 15 - 41 U/L 21  31  36   ALT 0 - 44 U/L 17  32  35     Physical Findings   PHQ2-9    Flowsheet Row ED from 01/26/2022 in [facility] Office Visit from 05/17/2021 in [facility] Office Visit from 07/03/2020 in [facility] Office Visit from 05/29/2020 in [place]  PHQ-2 Total Score 0 0 0 0  PHQ-9 Total Score -- -- 0 --      [place] ED from 01/26/2022 in [facility] Most recent reading at 01/26/2022  7:02 PM ED from 01/26/2022 in [place] Most recent reading at 01/26/2022  4:24 PM ED from 01/26/2022 in [place]  [place] Most recent reading at 01/26/2022  3:58 PM  C-SSRS RISK CATEGORY No Risk No Risk No Risk        Musculoskeletal  Strength & Muscle Tone: within normal limits Gait & Station: normal Patient leans: N/A  Psychiatric Specialty Exam  Presentation  General Appearance: Appropriate for Environment; Casual  Eye Contact:Good  Speech:Clear and Coherent; Normal Rate  Speech Volume:Normal  Handedness:Right   Mood and Affect  Mood:Dysphoric  Affect:Appropriate; Congruent   Thought Process  Thought Processes:Coherent; Goal Directed  Descriptions of Associations:Intact  Orientation:Full (Time, Place and Person)  Thought Content:Logical  Diagnosis of Schizophrenia or Schizoaffective disorder in past: No    Hallucinations:Hallucinations: None  Ideas of Reference:None  Suicidal Thoughts:Suicidal Thoughts: No  Homicidal Thoughts:Homicidal Thoughts:  No   Sensorium  Memory:Immediate Good; Recent Good; Remote Good  Judgment:Intact  Insight:Present   Executive Functions  Concentration:Good  Attention Span:Good  [place] of Knowledge:Good  Language:Good   Psychomotor Activity  Psychomotor Activity:Psychomotor Activity: Normal   Assets  Assets:Communication Skills; Desire for Improvement; Financial Resources/Insurance; Housing; Leisure Time; Resilience; Social Support   Sleep  Sleep:Sleep: Good   Physical Exam  Physical Exam Vitals and nursing note reviewed.  Constitutional:      General: He is not in acute distress.    Appearance: He is well-developed.  HENT:     Head: Normocephalic and atraumatic.  Eyes:     Conjunctiva/sclera: Conjunctivae normal.  Cardiovascular:     Rate and Rhythm: Normal rate and regular rhythm.     Heart sounds: No murmur heard. Pulmonary:     Effort: Pulmonary effort is normal. No respiratory distress.     Breath sounds: Normal breath sounds.  Abdominal:     Palpations: Abdomen is soft.     Tenderness: There is no abdominal tenderness.  Musculoskeletal:        General: No swelling.     Cervical back: Neck supple.     Right lower leg: No edema.     Left lower leg: No edema.  Skin:    General: Skin is warm and dry.     Capillary Refill: Capillary refill takes less than 2 seconds.  Neurological:     General: No focal deficit present.     Mental Status: He is alert and oriented to person, place, and time. Mental status is at baseline.     Cranial Nerves: No cranial nerve deficit.  Psychiatric:        Mood and Affect: Mood normal.    Review of Systems  Respiratory:  Negative for shortness of breath.   Cardiovascular:  Negative for chest pain.  Gastrointestinal:  Positive for abdominal pain, diarrhea, nausea and vomiting. Negative for constipation and heartburn.  Genitourinary:  Positive for frequency and urgency.  Neurological:  Negative for headaches.   Blood  pressure 120/69, pulse 63, temperature 99.4 F (37.4 C), temperature source Oral, resp. rate 20, SpO2 100 %. There is no height or weight on file to calculate BMI.  ASSESSMENT Bradley Hull is a 71 y.o. male with PMHx of opioid use disorder, cocaine use disorder, substance-induced mood disorder, osteoarthritis of bilateral knees status post TKA, CVA in 2022, complete R ICA stenosis, liver cirrhosis, questionable seizure disorder, hypertension, hyperlipidemia, BPH, malignant neoplasm of prostate s/p radiaiton 2022, anemia, hyperlipidemia, type 2 diabetes on insulin, presenting to [facility] on 01/26/2022 for opioid detox. This is FBC day 0.  PLAN Opiate use disorder Cocaine use disorder Nicotine use Disorder MDD- severe,  recurrent episode w/o psychosis While opiate use may be contributing to patient's depressive symptoms, patient does appear to have multiple stressors and symptoms that are more specific to MDD rather than just substance-induced mood disorder.  Patient was amenable to starting Zoloft.  Given patient is not acutely exhibiting severe opiate withdrawal, no plan to start clonidine taper. -Opiate Detox PRN in place -COWS+CIWA (Both 0) -NRT -Ensure for nutritional supplementation -Start Zoloft 25 mg daily. R/b/se discussed with patient and patient amenable to medication trial.  GERD -Pantoprazole 40 mg daily  Hypokalemia K3.4 -Repleted  Questionable Seizure Disorder -Continue home Keppra 500 mg twice daily  T2DM, controlled A1c 6.4 -Insulin Glargine-Lixisenatide 60 u daily -Moderate SSI  History of CVA Complete R ICA stenosis -Continue baby aspirin  Osteoarthritis s/p TKA -Monitor mobility and pain  BPH Malignant Neoplasm of Prostate, treated Urinary Incontinence -PSA -Continue finasteride 5 mg daily and Flomax 0.5 mg as needed  Hyperlipidemia Lipid panel wnl -Continue lipitor 80 mg qd  HTN Patient denies symptoms of opiates -Continue home metoprolol 12.5 mg  bid -Continue amlodipine 10 mg qd -Continue home Coreg 12.5 mg bid wc  Liver Cirrhosis without varices HCV, treated Not in acute decompensation. Never had ascites. -Continue to monitor  Dispo: Barriers to discharge include opiate withdrawal but plan currently is to return to sisters after hospitalization   France Ravens, MD 01/27/2022 8:10 AM

## 2022-01-27 NOTE — ED Notes (Signed)
Patient with no sxs of distress noted - will continue to monitor for safety

## 2022-01-27 NOTE — Clinical Social Work Psych Note (Signed)
LCSW Initial Note  LCSW met with Bradley Hull for introduction and to begin discussions regarding treatment and potential discharge planning.   Bradley Hull presented with a dysphoric affect, congruent mood, however, he was pleasant and cooperative throughout this encounter.[name] shared that he initially presented to the [facility] seeking assistance with detox services for an ongoing addiction to opiates. He also is requesting assistance with mental health services to address depressive symptoms.   Per [name]'s initial CCA note: " 71 year old present to [facility] with request for detox off opioid (hydrocodone). When asked how many pills he takes per day he stated, "a month ago I got a 170 hydrocodone 88m and they were gone in less than 10 days). He states he also buys them off the street. He takes the hydrocodone orally with beer. Denied suicidal/homicidal ideations, and denied auditory/visual hallucinations. Report he relapse on cocaine the other day after been clean for a year. Report he last had a pill on Sunday January 23, 2022 (4 pills). 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 Patient reports that [place] after having a confrontation with a lady he has been living with in [place] Patient reports he called his sister to pick him up and now he is living with his sister".    [name] plans to receive medication management services with his primary care provider. HNadara Mustardand Dr. JLurline Hare MD and Dr. LSerafina Mitchell MD spoke about starting the anti-depressant, Zoloft to address his concerns regarding depressive symptoms. HAesonexpressed that his main goal was to complete detox and "clean my body out".  [name] that he plans to return home with his sister at discharge. [name] shared that he has  potential housing arrangement confirmed for August with a roommate.  HNehaldenied having any additional questions or concerns at this time.   LCSW will continue to follow.   JRadonna Ricker MSW, LCSW Clinical SEducation officer, museum([facility]

## 2022-01-27 NOTE — ED Notes (Addendum)
Patient is awake and alert on unit without issue or complaint.  He ate breakfast and received 2 units of novolog for FS of 145.  Patient is calm and pleasant without signs of withdrawal.  Denies avh shi or plan.  Encouraged him to seek out staff if overwhelmed by thoughts or feelings.  Will monitor and provide supportive environment.

## 2022-01-27 NOTE — Progress Notes (Signed)
Patient went outside for a short period and socialized with Probation officer and peer.  Patient then ate lunch and returned to bed where he remains at this time.  No withdrawals at this time.  Will monitor and provide safe environment.

## 2022-01-28 ENCOUNTER — Encounter (HOSPITAL_COMMUNITY): Payer: Self-pay

## 2022-01-28 DIAGNOSIS — F1920 Other psychoactive substance dependence, uncomplicated: Secondary | ICD-10-CM | POA: Diagnosis not present

## 2022-01-28 LAB — GLUCOSE, CAPILLARY
Glucose-Capillary: 128 mg/dL — ABNORMAL HIGH (ref 70–99)
Glucose-Capillary: 137 mg/dL — ABNORMAL HIGH (ref 70–99)

## 2022-01-28 MED ORDER — SENNOSIDES-DOCUSATE SODIUM 8.6-50 MG PO TABS
1.0000 | ORAL_TABLET | Freq: Every day | ORAL | Status: DC
  Administered 2022-01-28 – 2022-01-29 (×2): 1 via ORAL
  Filled 2022-01-28 (×2): qty 1

## 2022-01-28 MED ORDER — TRAZODONE HCL 50 MG PO TABS
50.0000 mg | ORAL_TABLET | Freq: Every day | ORAL | Status: DC
  Administered 2022-01-28: 50 mg via ORAL
  Filled 2022-01-28: qty 1

## 2022-01-28 MED ORDER — OXYBUTYNIN CHLORIDE 5 MG PO TABS
2.5000 mg | ORAL_TABLET | Freq: Once | ORAL | Status: AC
  Administered 2022-01-28: 2.5 mg via ORAL
  Filled 2022-01-28: qty 1

## 2022-01-28 MED ORDER — POLYETHYLENE GLYCOL 3350 17 G PO PACK
17.0000 g | PACK | Freq: Every day | ORAL | Status: DC
  Administered 2022-01-28 – 2022-01-29 (×2): 17 g via ORAL
  Filled 2022-01-28 (×2): qty 1

## 2022-01-28 NOTE — Clinical Social Work Psych Note (Signed)
LCSW Update Note  Bradley Hull presented with a dysphoric mood, congruent affect, however was pleasant and cooperative.   Bradley Hull denied having any SI, HI or AVH at this time. Bradley Hull did express complaints of stomach upset, constipation and back pain.   Bradley Hull shared with providers that after thinking over night, he does not believe that staying with either of his two sisters temporarily is "the best thing for me". Bradley Hull continued to state "Im just not comfortable inserting myself into their lives. They have grandchildren and the kids just stare at me like Im not suppose to be there."  Bradley Hull expressed interest in residential treatment services as an alternative for housing and to continue care on his opioid addiction issues. LCSW explained the referral process and how Bradley Hull's insurance [facility]) could be a barrier. LCSW also prepared Bradley Hull by sharing that it is possible his various medical concerns could be a barrier as well.  Bradley Hull expressed understanding and was agreeable to move forward in the referral process.    LCSW spoke with Bradley Hull  [facility]) admissions due to it possibly being an option for this patient. According to [facility], the patient's information will be reviewed. [facility] did share that [employer] system was currently down and that this could delay acceptance, if found appropriate.   [place] admissions reports they will contact LCSW once the patient's insurance can be verified and his chart is reviewed.   LCSW shared this information with Bradley Hull as an update. Bradley Hull shared that he was UNSURE if he wanted to go to [place] due to it being "so far". Bradley Hull reports that he wanted to discuss this matter with his sisters and that he will let staff know once he made up his mind.   LCSW will continue to follow.     Radonna Ricker, MSW, LCSW Clinical Education officer, museum ([place]) [facility]

## 2022-01-28 NOTE — ED Notes (Signed)
Patient is calm and in good behavioral control.  Patient is pleasant, organized and logical. No complaints or distress.  Denies avh shi or plan.  Patient is motivated for treatment and exhibits fair insight into substance use.  Will monitor and provide safe supportive environment.

## 2022-01-28 NOTE — ED Provider Notes (Signed)
Behavioral Health Progress Note  Date and Time: 01/28/2022 10:10 AM Name: Bradley Hull MRN:  [id number]  Subjective:   Patient seen and assessed at bedside. Patient reports "feeling better". Reports appetite still poor but reports attempting to drink Ensure which he is tolerating. Reports having constipation (no BM since 2 days ago). Denies any other withdrawal symptoms at this time. Reports sleep was disrupted which he attributes to nighttime meds (hydroxyzine, methocarbamol, naproxen), also possibly secondary to nicotine patch.   Denies present SI/HI/AVH.  Discussed disposition and patient says that he would be interested in a residential substance rehab treatment; however, if he is unable to go to a substance rehab treatment, he would be able to go to one of his sisters house until moving in with new roommate on August 1.  Diagnosis:  Final diagnoses:  Substance induced mood disorder ([place])    Total Time spent with patient: 45 minutes   Labs  Lab Results:     Latest Ref Rng & Units 01/26/2022    4:45 PM 07/27/2021   10:03 AM 02/04/2021    1:18 PM  CBC  WBC 4.0 - 10.5 K/uL 7.0  6.8  8.4   Hemoglobin 13.0 - 17.0 g/dL 11.9  13.4  13.0   Hematocrit 39.0 - 52.0 % 36.5  41.1  37.5   Platelets 150 - 400 K/uL 217  336.0  250.0       Latest Ref Rng & Units 01/26/2022    4:45 PM 07/27/2021   10:03 AM 12/30/2020    7:54 PM  CMP  Glucose 70 - 99 mg/dL 182  127  147   BUN 8 - 23 mg/dL '12  7  17   '$ Creatinine 0.61 - 1.24 mg/dL 0.94  0.76  0.59   Sodium 135 - 145 mmol/L 142  138  138   Potassium 3.5 - 5.1 mmol/L 3.3  3.6  3.6   Chloride 98 - 111 mmol/L 109  101  104   CO2 22 - 32 mmol/L '23  25  27   '$ Calcium 8.9 - 10.3 mg/dL 9.7  10.3  9.7   Total Protein 6.5 - 8.1 g/dL 6.9  7.9  7.4   Total Bilirubin 0.3 - 1.2 mg/dL 0.7  1.2  0.7   Alkaline Phos 38 - 126 U/L 85  79  90   AST 15 - 41 U/L 21  31  36   ALT 0 - 44 U/L 17  32  35     Physical Findings   PHQ2-9    Flowsheet Row ED from  01/26/2022 in [facility] Most recent reading at 01/27/2022  8:30 AM ED from 01/26/2022 in [place] Most recent reading at 01/26/2022 12:33 PM Office Visit from 05/17/2021 in [place] Most recent reading at 05/17/2021  2:25 PM Office Visit from 07/03/2020 in [place] Most recent reading at 07/03/2020  7:59 AM Office Visit from 05/29/2020 in [place] Most recent reading at 05/29/2020  1:32 PM  PHQ-2 Total Score 2 0 0 0 0  PHQ-9 Total Score 7 -- -- 0 --      Flowsheet Row ED from 01/26/2022 in [facility] Most recent reading at 01/26/2022  7:02 PM ED from 01/26/2022 in [place] Most recent reading at 01/26/2022  4:24 PM ED from 01/26/2022 in [facility] Most  recent reading at 01/26/2022  3:58 PM  C-SSRS RISK CATEGORY No Risk No Risk No Risk        Musculoskeletal  Strength & Muscle Tone: within normal limits Gait & Station: normal Patient leans: N/A  Psychiatric Specialty Exam  Presentation  General Appearance: Appropriate for Environment; Casual  Eye Contact:Fair  Speech:Clear and Coherent; Normal Rate  Speech Volume:Normal  Handedness:Right   Mood and Affect  Mood:Depressed  Affect:Appropriate; Congruent   Thought Process  Thought Processes:Coherent; Goal Directed; Linear  Descriptions of Associations:Intact  Orientation:Full (Time, Place and Person)  Thought Content:Logical  Diagnosis of Schizophrenia or Schizoaffective disorder in past: No    Hallucinations:Hallucinations: None   Ideas of Reference:None  Suicidal Thoughts:Suicidal Thoughts: No   Homicidal Thoughts:Homicidal Thoughts: No    Sensorium  Memory:Immediate Good; Recent Good; Remote  Good  Judgment:Intact  Insight:Present   Executive Functions  Concentration:Good  Attention Span:Good  [place] of Knowledge:Good  Language:Good   Psychomotor Activity  Psychomotor Activity:Psychomotor Activity: Normal    Assets  Assets:Communication Skills; Desire for Improvement; Financial Resources/Insurance; Leisure Time; Social Support; Transportation   Sleep  Sleep:Sleep: Fair    Physical Exam  Physical Exam Vitals and nursing note reviewed.  Constitutional:      General: He is not in acute distress.    Appearance: He is well-developed.  HENT:     Head: Normocephalic and atraumatic.  Eyes:     Conjunctiva/sclera: Conjunctivae normal.  Cardiovascular:     Rate and Rhythm: Normal rate and regular rhythm.     Heart sounds: No murmur heard. Pulmonary:     Effort: Pulmonary effort is normal. No respiratory distress.     Breath sounds: Normal breath sounds.  Abdominal:     Palpations: Abdomen is soft.     Tenderness: There is no abdominal tenderness.  Musculoskeletal:        General: No swelling.     Cervical back: Neck supple.     Right lower leg: No edema.     Left lower leg: No edema.  Skin:    General: Skin is warm and dry.     Capillary Refill: Capillary refill takes less than 2 seconds.  Neurological:     General: No focal deficit present.     Mental Status: He is alert and oriented to person, place, and time. Mental status is at baseline.     Cranial Nerves: No cranial nerve deficit.  Psychiatric:        Mood and Affect: Mood normal.    Review of Systems  Respiratory:  Negative for shortness of breath.   Cardiovascular:  Negative for chest pain.  Gastrointestinal:  Positive for abdominal pain, diarrhea, nausea and vomiting. Negative for constipation and heartburn.  Genitourinary:  Positive for frequency and urgency.  Neurological:  Negative for headaches.   Blood pressure (!) 146/78, pulse 64, temperature 98.9 F (37.2 C),  temperature source Tympanic, resp. rate 20, SpO2 100 %. There is no height or weight on file to calculate BMI.  ASSESSMENT Bradley Hull is a 71 y.o. male with PMHx of opioid use disorder, cocaine use disorder, substance-induced mood disorder, osteoarthritis of bilateral knees status post TKA, CVA in 2022, complete R ICA stenosis, liver cirrhosis, questionable seizure disorder, hypertension, hyperlipidemia, BPH, malignant neoplasm of prostate s/p radiaiton 2022, anemia, hyperlipidemia, type 2 diabetes on insulin, presenting to [facility] on 01/26/2022 for opioid detox. This is FBC day 0.  PLAN Opiate use disorder Cocaine use disorder Nicotine use Disorder  MDD- severe, recurrent episode w/o psychosis -Opiate Detox PRN in place -COWS+CIWA (Both 0) -Discontinue NRT as patient does not want -Ensure for nutritional supplementation -Continue Zoloft 25 mg daily. R/b/se discussed with patient and patient amenable to medication trial. -Schedule Trazodone 50 mg qhs -Start bowel regiment: sennokot + miralax  GERD -Pantoprazole 40 mg daily  Hypokalemia, resolved  Questionable Seizure Disorder -Continue home Keppra 500 mg twice daily  T2DM, controlled A1c 6.4 -Insulin Glargine-Lixisenatide 60 u daily -Moderate SSI  History of CVA Complete R ICA stenosis -Continue baby aspirin  Osteoarthritis s/p TKA -Monitor mobility and pain  BPH Malignant Neoplasm of Prostate, treated Urinary Incontinence -PSA -Continue finasteride 5 mg daily and Flomax 0.5 mg as needed -Start oxybutynin 2.5 mg for urinary incontinence and possible bladder spasms -Monitor for bladder retention  Hyperlipidemia Lipid panel wnl -Continue lipitor 80 mg qd  HTN Patient denies symptoms of opiates -Continue home metoprolol 12.5 mg bid -Continue amlodipine 10 mg qd -Continue home Coreg 12.5 mg bid wc  Liver Cirrhosis without varices HCV, treated Not in acute decompensation. Never had ascites. -Continue to  monitor  Dispo: Barriers to discharge include opiate withdrawal but plan currently is to return to sisters after hospitalization   France Ravens, MD 01/28/2022 10:10 AM

## 2022-01-28 NOTE — BH IP Treatment Plan (Signed)
Interdisciplinary Treatment and Diagnostic Plan Update  01/28/2022 Time of Session: 10:00AM  Bradley Hull MRN: [id number]  Diagnosis:  Final diagnoses:  Substance induced mood disorder (Bradley Hull)     Current Medications:  Current Facility-Administered Medications  Medication Dose Route Frequency Provider Last Rate Last Admin   acetaminophen (TYLENOL) tablet 650 mg  650 mg Oral Q6H PRN Rankin, Shuvon B, NP       alum & mag hydroxide-simeth (MAALOX/MYLANTA) 200-200-20 MG/5ML suspension 30 mL  30 mL Oral Q4H PRN Rankin, Shuvon B, NP       amLODipine (NORVASC) tablet 10 mg  10 mg Oral Daily Rankin, Shuvon B, NP   10 mg at 01/28/22 0947   aspirin EC tablet 81 mg  81 mg Oral Daily Rankin, Shuvon B, NP   81 mg at 01/28/22 0947   atorvastatin (LIPITOR) tablet 80 mg  80 mg Oral QHS Rankin, Shuvon B, NP   80 mg at 01/27/22 2131   carvedilol (COREG) tablet 6.25 mg  6.25 mg Oral BID WC Rankin, Shuvon B, NP   6.25 mg at 01/28/22 0813   dicyclomine (BENTYL) tablet 20 mg  20 mg Oral Q6H PRN Ival Bible, MD       feeding supplement (ENSURE ENLIVE / ENSURE PLUS) liquid 237 mL  237 mL Oral BID BM France Ravens, MD   237 mL at 01/28/22 0947   finasteride (PROSCAR) tablet 5 mg  5 mg Oral Daily Rankin, Shuvon B, NP   5 mg at 01/28/22 0947   Glucagon SOAJ 1 Act  1 Act Subcutaneous Daily PRN Rankin, Shuvon B, NP       hydrOXYzine (ATARAX) tablet 25 mg  25 mg Oral Q6H PRN Ival Bible, MD   25 mg at 01/27/22 2136   insulin aspart (novoLOG) injection 0-15 Units  0-15 Units Subcutaneous TID WC France Ravens, MD   2 Units at 01/28/22 1159   Insulin Glargine-Lixisenatide 100-33 UNT-MCG/ML SOPN 60 Units  60 Units Subcutaneous Daily Rankin, Shuvon B, NP   60 Units at 01/28/22 1001   levETIRAcetam (KEPPRA) tablet 500 mg  500 mg Oral BID Rankin, Shuvon B, NP   500 mg at 01/28/22 0948   loperamide (IMODIUM) capsule 2-4 mg  2-4 mg Oral PRN Ival Bible, MD       magnesium hydroxide (MILK OF MAGNESIA)  suspension 30 mL  30 mL Oral Daily PRN Rankin, Shuvon B, NP       methocarbamol (ROBAXIN) tablet 500 mg  500 mg Oral Q8H PRN Ival Bible, MD   500 mg at 01/27/22 2132   metoprolol tartrate (LOPRESSOR) tablet 12.5 mg  12.5 mg Oral BID Rankin, Shuvon B, NP   12.5 mg at 01/28/22 0948   multivitamin with minerals tablet 1 tablet  1 tablet Oral Daily Ival Bible, MD   1 tablet at 01/28/22 0950   naproxen (NAPROSYN) tablet 500 mg  500 mg Oral BID PRN Ival Bible, MD   500 mg at 01/27/22 2138   ondansetron (ZOFRAN-ODT) disintegrating tablet 4 mg  4 mg Oral Q6H PRN Ival Bible, MD       pantoprazole (PROTONIX) EC tablet 40 mg  40 mg Oral Daily Rankin, Shuvon B, NP   40 mg at 01/28/22 0950   polyethylene glycol (MIRALAX / GLYCOLAX) packet 17 g  17 g Oral Daily France Ravens, MD   17 g at 01/28/22 0958   senna-docusate (Senokot-S) tablet 1 tablet  1 tablet Oral  Daily France Ravens, MD   1 tablet at 01/28/22 0957   sertraline (ZOLOFT) tablet 25 mg  25 mg Oral Daily France Ravens, MD   25 mg at 01/28/22 0950   tamsulosin (FLOMAX) capsule 0.4 mg  0.4 mg Oral Daily PRN Rankin, Shuvon B, NP       traZODone (DESYREL) tablet 50 mg  50 mg Oral Aliene Altes, MD       Current Outpatient Medications  Medication Sig Dispense Refill   amLODipine (NORVASC) 10 MG tablet Take 1 tablet (10 mg total) by mouth daily. 90 tablet 1   aspirin EC 81 MG tablet Take 81 mg by mouth daily. Swallow whole.     atorvastatin (LIPITOR) 80 MG tablet Take 80 mg by mouth at bedtime.     carvedilol (COREG) 6.25 MG tablet Take 1 tablet (6.25 mg total) by mouth 2 (two) times daily with a meal. 180 tablet 1   finasteride (PROSCAR) 5 MG tablet Take 5 mg by mouth daily.     Glucagon (GVOKE HYPOPEN 2-PACK) 1 MG/0.2ML SOAJ Inject 1 Act into the skin daily as needed. 2 mL 5   levETIRAcetam (KEPPRA) 500 MG tablet Take 500 mg by mouth in the morning and at bedtime.     metoprolol tartrate (LOPRESSOR) 25 MG tablet Take  12.5 mg by mouth 2 (two) times daily.     multivitamin-iron-minerals-folic acid (CENTRUM) chewable tablet Chew 1 tablet by mouth daily.     omeprazole (PRILOSEC) 40 MG capsule Use Prilosec (omeprazole) 40 mg PO BID for 6 weeks to promote mucosal healing, then reduce to 40 mg daily and continue to titrate to lowest effective dose if abdominal symptoms resolved. (Patient not taking: Reported on 01/26/2022) 180 capsule 1   SOLIQUA 100-33 UNT-MCG/ML SOPN Inject 50 Units into the skin daily. (Patient taking differently: Inject 60 Units into the skin daily.) 15 mL 0   tamsulosin (FLOMAX) 0.4 MG CAPS capsule Take 1 capsule (0.4 mg total) by mouth daily. (Patient taking differently: Take 0.4 mg by mouth daily as needed.) 90 capsule 1   PTA Medications: Prior to Admission medications   Medication Sig Start Date End Date Taking? Authorizing Provider  amLODipine (NORVASC) 10 MG tablet Take 1 tablet (10 mg total) by mouth daily. 09/06/21   [name], MD  aspirin EC 81 MG tablet Take 81 mg by mouth daily. Swallow whole.    [provider]  atorvastatin (LIPITOR) 80 MG tablet Take 80 mg by mouth at bedtime. 10/06/21   [provider]  carvedilol (COREG) 6.25 MG tablet Take 1 tablet (6.25 mg total) by mouth 2 (two) times daily with a meal. 09/06/21   [name], MD  finasteride (PROSCAR) 5 MG tablet Take 5 mg by mouth daily.    [provider]  Glucagon (GVOKE HYPOPEN 2-PACK) 1 MG/0.2ML SOAJ Inject 1 Act into the skin daily as needed. 06/01/21   [name], MD  levETIRAcetam (KEPPRA) 500 MG tablet Take 500 mg by mouth in the morning and at bedtime. 10/06/21   [provider]  metoprolol tartrate (LOPRESSOR) 25 MG tablet Take 12.5 mg by mouth 2 (two) times daily. 10/03/21   [provider]  multivitamin-iron-minerals-folic acid (CENTRUM) chewable tablet Chew 1 tablet by mouth daily.    [provider]  omeprazole (PRILOSEC) 40 MG capsule Use Prilosec  (omeprazole) 40 mg PO BID for 6 weeks to promote mucosal healing, then reduce to 40 mg daily and continue to titrate  to lowest effective dose if abdominal symptoms resolved. Patient not taking: Reported on 01/26/2022 09/10/20   Cirigliano, Vito V, DO  SOLIQUA 100-33 UNT-MCG/ML SOPN Inject 50 Units into the skin daily. Patient taking differently: Inject 60 Units into the skin daily. 11/30/21   [name], MD  tamsulosin (FLOMAX) 0.4 MG CAPS capsule Take 1 capsule (0.4 mg total) by mouth daily. Patient taking differently: Take 0.4 mg by mouth daily as needed. 09/29/20   [name], MD    Patient Stressors: Financial difficulties   Health problems   Marital or family conflict   Substance abuse    Patient Strengths: Ability for insight  Motivation for treatment/growth  Supportive family/friends   Treatment Modalities: Medication Management, Group therapy, Case management,  1 to 1 session with clinician, Psychoeducation, Recreational therapy.   Physician Treatment Plan for Primary and Secondary Diagnosis:  Final diagnoses:  Substance induced mood disorder (Bradley Hull)   Long Term Goal(s):    Short Term Goals:    Medication Management: Evaluate patient's response, side effects, and tolerance of medication regimen.  Therapeutic Interventions: 1 to 1 sessions, Unit Group sessions and Medication administration.  Evaluation of Outcomes: Progressing  LCSW Treatment Plan for Primary Diagnosis:  Final diagnoses:  Substance induced mood disorder (Bradley Hull)    Long Term Goal(s): Safe transition to appropriate next level of care at discharge.  Short Term Goals: Facilitate acceptance of mental health diagnosis and concerns through verbal commitment to aftercare plan and appointments at discharge. and Identify minimum of 2 triggers associated with mental health/substance abuse issues with treatment team members.  Therapeutic Interventions: Assess for all discharge needs, 1 to 1 time with Research officer, political party, Explore available resources and support systems, Assess for adequacy in community support network, Educate family and significant other(s) on suicide prevention, Complete Psychosocial Assessment, Interpersonal group therapy.  Evaluation of Outcomes: Progressing   Progress in Treatment: Attending groups: Yes. Participating in groups: Yes. Taking medication as prescribed: Yes. Toleration medication: Yes. Family/Significant other contact made: No, will contact:  no one at this time.  Patient understands diagnosis: Yes. Discussing patient identified problems/goals with staff: Yes. Medical problems stabilized or resolved: Yes. Denies suicidal/homicidal ideation: Yes. Issues/concerns per patient self-inventory: No. Other: None   New problem(s) identified: No, Describe:  None   New Short Term/Long Term Goal(s): Imran wants to receive detox services to "clear out" the opioids he used prior to coming to this facility. Joseff shared that a long-term goal is stable housing.   Patient Goals:  "I need to clean myself and get straight. Im too old to be going through this here"  Discharge Plan or Barriers: Mancel expressed interest in participating in residential substance abuse treatment services, however due to his medical complexities, Shadman will likely discharge home with his sister with outpatient resources for psychiatry, therapy and substance abuse.  Reason for Continuation of Hospitalization: Depression Medication stabilization  Estimated Length of Stay: 3-5 days  Last 3 [place] Suicide Severity Risk Score: [facility] ED from 01/26/2022 in [facility] Most recent reading at 01/26/2022  7:02 PM ED from 01/26/2022 in [place] Most recent reading at 01/26/2022  4:24 PM ED from 01/26/2022 in [facility] Most recent reading at 01/26/2022  3:58 PM  C-SSRS RISK CATEGORY No Risk No Risk No Risk        Last PHQ 2/9 Scores:    01/27/2022    8:30 AM 01/26/2022   12:33  PM 05/17/2021    2:25 PM  Depression screen PHQ 2/9  Decreased Interest 1 0 0  Down, Depressed, Hopeless 1 0 0  PHQ - 2 Score 2 0 0  Altered sleeping 1    Tired, decreased energy 1    Change in appetite 1    Feeling bad or failure about yourself  1    Trouble concentrating 1    Moving slowly or fidgety/restless 0    Suicidal thoughts 0    PHQ-9 Score 7    Difficult doing work/chores Somewhat difficult      Scribe for Treatment Team: Marylee Floras, LCSW 01/28/2022 3:58 PM

## 2022-01-28 NOTE — ED Notes (Signed)
Doing vitals this morning Pt. Wanted me to let the Nurse know that whatever he gave him last night for meds he didn't want to take again. I told RN on shift and told him to pass it along.

## 2022-01-28 NOTE — ED Notes (Signed)
Blood draw attempted without return.  Provider notified.  Pt declined second attempt at this time

## 2022-01-28 NOTE — Progress Notes (Signed)
Patient is intermittently visible on unit.  Calm, pleasant organized and logical.  He is motivated for treatment and is without complaint or distress.  Denies avh shi or plan.  Will monitor and provide safe environment for patient.

## 2022-01-29 ENCOUNTER — Encounter (HOSPITAL_COMMUNITY): Payer: Self-pay | Admitting: Registered Nurse

## 2022-01-29 DIAGNOSIS — F1994 Other psychoactive substance use, unspecified with psychoactive substance-induced mood disorder: Secondary | ICD-10-CM | POA: Diagnosis not present

## 2022-01-29 DIAGNOSIS — F1920 Other psychoactive substance dependence, uncomplicated: Secondary | ICD-10-CM | POA: Diagnosis not present

## 2022-01-29 LAB — GLUCOSE, CAPILLARY
Glucose-Capillary: 199 mg/dL — ABNORMAL HIGH (ref 70–99)
Glucose-Capillary: 80 mg/dL (ref 70–99)

## 2022-01-29 MED ORDER — SERTRALINE HCL 25 MG PO TABS: 25.0000 mg | ORAL_TABLET | Freq: Every day | ORAL | 0 refills | Status: DC

## 2022-01-29 NOTE — ED Notes (Signed)
Pt's sister bought pt's home medication (Insulin) for use. Pharmacy [facility]) notified. Pt request to wait an hour to take remainder 21 units of insulin due to FSBS 80. Provided education on long acting insulin. Pt eating meal at present. Will continue to monitor for safety.

## 2022-01-29 NOTE — ED Notes (Signed)
Patient has been pleasant. Patient was cooperative with medication administration. Patient went to bed immediately medication given. Patient states he want to get his health together. First thing he would like to fix is the blockage in his carotid artery. States the blockage lessens his quality of life because it makes him tired. Patient resting comfortably. No acute distress noted. Will continue to monitor safety.

## 2022-01-29 NOTE — ED Notes (Signed)
Pt sleeping in no acute distress. RR even and unlabored. Safety maintained. 

## 2022-01-29 NOTE — ED Provider Notes (Signed)
FBC/OBS ASAP Discharge Summary  Date and Time: 01/29/2022 12:50 PM  Name: Bradley Hull  MRN:  [id number]   Discharge Diagnoses:  Final diagnoses:  Substance induced mood disorder ([place])    Subjective: Patient seen and evaluated face-to-face by this provider, and chart reviewed. On evaluation, patient is alert and oriented x4. His thought process is logical and goal oriented. His speech is clear and coherent. His mood is euthymic and affect is congruent. Patient denies SI/HI/AVH. There is no objective evidence that the patient is currently responding to internal or external stimuli. He rates his mood 4 out of 10 with 10 being the worst. He states that he feels slightly depressed from blaming himself for his current situation. He describes his current situation as dealing with the same woman for years and using drugs.  He states that he is hopeful to get accepted to residential treatment but if he does not, then he will live with his sister until his apartment is ready. He denies experiencing any side effects to current medication regimen.   Stay Summary: Jd 71 year old male patient who presented to the [facility] on [facility]/22 with request for detox off opioid (hydrocodone). Reported buying them from off the street. He takes the hydrocodone orally with beer.Denied suicidal/homicidal ideations, and denied auditory/visual hallucinations.Report he relapse on cocaine the other day after been clean for a year. [facility] use [facility]. H\e was started on Zoloft 25 mg p.o. daily for substance-induced mood disorder. Patient's UDS positive for opiates and cocaine on arrival. Blood alcohol level was negative. Patient has not exhibited any significant withdrawal symptoms and both [place] and CIWA are 0. Patient completed a screening for [facility] on 01/28/22 and per CSW patient was declined treatment.Outpatient substance abuse and psychiatric services  discussed with patient. Patient verbalizes understanding and agrees to the stated plan. He states that he plan to stay with his sister until he is able to move into his apartment with his roommate on 02/22/22. Patient has been observed on the unit without any aggressive, disruptive, psychotic or self harm behaviors.   Total Time spent with patient: 20 minutes  Past Psychiatric History: Polysubstance use disorder  Past Medical History:  Past Medical History:  Diagnosis Date   Arthritis    Benign prostatic hyperplasia    Cirrhosis ([place])    Colon polyps    Complication of anesthesia    bp droppped and heart rate sped up after 09-09-2020 endoscopy /colonscopy   DM type 2 (diabetes mellitus, type 2) ([place])    Hepatitis C    took treatment for 2018 issue resolved per pt   History of kidney stones    Hypertension    Nicotine addiction    Obesity    Osteoarthritis of both knees    Prostate cancer ([place])    Renal lithiasis     Past Surgical History:  Procedure Laterality Date   COLONOSCOPY  09/09/2020   [facility]   CYSTOSCOPY  11/17/2020   Procedure: CYSTOSCOPY FLEXIBLE;  Surgeon: Irine Seal, MD;  Location: [facility];  Service: Urology;;   ESOPHAGOGASTRODUODENOSCOPY  2016   KIDNEY STONE SURGERY Right yrs ago   LITHOTRIPSY Left 1986   MULTIPLE EXTRACTIONS WITH ALVEOLOPLASTY N/A 09/09/2016   Procedure: MULTIPLE EXTRACTION WITH ALVEOLOPLASTY - one, two, six, seven, nine, ten, eleven, sixteen, seventeen, twenty, twenty-one, twenty-two, twenty-four, twenty-five, twenty-six, twenty-seven, twenty-eight, twenty-nine, thirty-one, thirty-two; alveolplasty; removal bilateral mandible turi; removal cyst right mandible;  bone  graft;  Surgeon: [name], DDS;  Location: [facility];  Service: Oral S   PILONIDAL CYST EXCISION  yrs ago   from spine   SPACE OAR INSTILLATION N/A 11/17/2020   Procedure: SPACE OAR INSTILLATION FIDUCIAL MARKER;  Surgeon: Irine Seal, MD;  Location: [facility];  Service: Urology;  Laterality: N/A;  prostate   TOTAL KNEE ARTHROPLASTY Bilateral 03/2020   UPPER GASTROINTESTINAL ENDOSCOPY  08/30/2020   [facility]    WISDOM TOOTH EXTRACTION     x4 removed   Family History:  Family History  Problem Relation Age of Onset   Lung cancer Mother        mets   Diabetes Mother    Breast cancer Mother    Diabetes Father    Hyperlipidemia Father    Colon cancer Father    Diabetes Sister    Hepatitis Brother    Schizophrenia Brother    Diabetes Sister    Diabetes Sister    Diabetes Brother    Diabetes Brother    Diabetes Maternal Grandmother    Esophageal cancer Neg Hx    Rectal cancer Neg Hx    Stomach cancer Neg Hx    Family Psychiatric History: No history reported.  Social History:  Social History   Substance and Sexual Activity  Alcohol Use Yes   Comment: occ     Social History   Substance and Sexual Activity  Drug Use Yes    Social History   Socioeconomic History   Marital status: Single    Spouse name: Not on file   Number of children: 2   Years of education: Not on file   Highest education level: Not on file  Occupational History   Occupation: retired  Tobacco Use   Smoking status: Some Days    Packs/day: 0.25    Years: 51.00    Total pack years: 12.75    Types: Cigarettes   Smokeless tobacco: Never   Tobacco comments:    Pt smokes 1 pack every other day  Vaping Use   Vaping Use: Never used  Substance and Sexual Activity   Alcohol use: Yes    Comment: occ   Drug use: Yes   Sexual activity: Yes  Other Topics Concern   Not on file  Social History Narrative   Not on file   Social Determinants of Health   Financial Resource Strain: Low Risk  (05/17/2021)   Overall Financial Resource Strain (CARDIA)    Difficulty of Paying Living Expenses: Not hard at all  Food Insecurity: No Food Insecurity (05/17/2021)   Hunger Vital Sign    Worried About Running Out of Food in the Last Year: Never true    [place] in the Last Year: Never true  Transportation Needs: No Transportation Needs (05/17/2021)   PRAPARE - Hydrologist (Medical): No    Lack of Transportation (Non-Medical): No  Physical Activity: Insufficiently Active (05/17/2021)   Exercise Vital Sign    Days of Exercise per Week: 2 days    Minutes of Exercise per Session: 30 min  Stress: No Stress Concern Present (05/17/2021)   [place]    Feeling of Stress : Only a little  Social Connections: Moderately Isolated (05/17/2021)   Social Connection and Isolation Panel [NHANES]    Frequency of Communication with Friends and Family: Three times a week    Frequency of Social Gatherings with  Friends and Family: Three times a week    Attends Religious Services: 1 to 4 times per year    Active Member of Clubs or Organizations: No    Attends Archivist Meetings: Never    Marital Status: Never married   SDOH:  SDOH Screenings   Alcohol Screen: Low Risk  (05/17/2021)   Alcohol Screen    Last Alcohol Screening Score (AUDIT): 3  Depression (PHQ2-9): Medium Risk (01/27/2022)   Depression (PHQ2-9)    PHQ-2 Score: 7  Financial Resource Strain: Low Risk  (05/17/2021)   Overall Financial Resource Strain (CARDIA)    Difficulty of Paying Living Expenses: Not hard at all  Food Insecurity: No Food Insecurity (05/17/2021)   Hunger Vital Sign    Worried About Running Out of Food in the Last Year: Never true    [place] in the Last Year: Never true  Housing: Low Risk  (05/17/2021)   Housing    Last Housing Risk Score: 0  Physical Activity: Insufficiently Active (05/17/2021)   Exercise Vital Sign    Days of Exercise per Week: 2 days    Minutes of Exercise per Session: 30 min  Social Connections: Moderately Isolated (05/17/2021)   Social Connection and Isolation Panel [NHANES]    Frequency of Communication with Friends and Family: Three  times a week    Frequency of Social Gatherings with Friends and Family: Three times a week    Attends Religious Services: 1 to 4 times per year    Active Member of Clubs or Organizations: No    Attends Archivist Meetings: Never    Marital Status: Never married  Stress: No Stress Concern Present (05/17/2021)   [place]    Feeling of Stress : Only a little  Tobacco Use: High Risk (01/29/2022)   Patient History    Smoking Tobacco Use: Some Days    Smokeless Tobacco Use: Never    Passive Exposure: Not on file  Transportation Needs: No Transportation Needs (05/17/2021)   PRAPARE - Transportation    Lack of Transportation (Medical): No    Lack of Transportation (Non-Medical): No    Tobacco Cessation:  Prescription not provided because: declined  Current Medications:  Current Facility-Administered Medications  Medication Dose Route Frequency Provider Last Rate Last Admin   acetaminophen (TYLENOL) tablet 650 mg  650 mg Oral Q6H PRN Rankin, Shuvon B, NP       alum & mag hydroxide-simeth (MAALOX/MYLANTA) 200-200-20 MG/5ML suspension 30 mL  30 mL Oral Q4H PRN Rankin, Shuvon B, NP       amLODipine (NORVASC) tablet 10 mg  10 mg Oral Daily Rankin, Shuvon B, NP   10 mg at 01/29/22 0901   aspirin EC tablet 81 mg  81 mg Oral Daily Rankin, Shuvon B, NP   81 mg at 01/29/22 0901   atorvastatin (LIPITOR) tablet 80 mg  80 mg Oral QHS Rankin, Shuvon B, NP   80 mg at 01/28/22 2034   carvedilol (COREG) tablet 6.25 mg  6.25 mg Oral BID WC Rankin, Shuvon B, NP   6.25 mg at 01/29/22 0900   dicyclomine (BENTYL) tablet 20 mg  20 mg Oral Q6H PRN Ival Bible, MD       feeding supplement (ENSURE ENLIVE / ENSURE PLUS) liquid 237 mL  237 mL Oral BID BM France Ravens, MD   237 mL at 01/28/22 0947   finasteride (PROSCAR) tablet 5 mg  5  mg Oral Daily Rankin, Shuvon B, NP   5 mg at 01/29/22 0901   Glucagon SOAJ 1 Act  1 Act Subcutaneous  Daily PRN Rankin, Shuvon B, NP       hydrOXYzine (ATARAX) tablet 25 mg  25 mg Oral Q6H PRN Ival Bible, MD   25 mg at 01/27/22 2136   insulin aspart (novoLOG) injection 0-15 Units  0-15 Units Subcutaneous TID WC France Ravens, MD   3 Units at 01/29/22 0845   Insulin Glargine-Lixisenatide 100-33 UNT-MCG/ML SOPN 60 Units  60 Units Subcutaneous Daily Rankin, Shuvon B, NP   39 Units at 01/29/22 0910   levETIRAcetam (KEPPRA) tablet 500 mg  500 mg Oral BID Rankin, Shuvon B, NP   500 mg at 01/29/22 5400   loperamide (IMODIUM) capsule 2-4 mg  2-4 mg Oral PRN Ival Bible, MD       magnesium hydroxide (MILK OF MAGNESIA) suspension 30 mL  30 mL Oral Daily PRN Rankin, Shuvon B, NP       methocarbamol (ROBAXIN) tablet 500 mg  500 mg Oral Q8H PRN Ival Bible, MD   500 mg at 01/27/22 2132   metoprolol tartrate (LOPRESSOR) tablet 12.5 mg  12.5 mg Oral BID Rankin, Shuvon B, NP   12.5 mg at 01/29/22 0900   multivitamin with minerals tablet 1 tablet  1 tablet Oral Daily Ival Bible, MD   1 tablet at 01/29/22 0901   naproxen (NAPROSYN) tablet 500 mg  500 mg Oral BID PRN Ival Bible, MD   500 mg at 01/27/22 2138   ondansetron (ZOFRAN-ODT) disintegrating tablet 4 mg  4 mg Oral Q6H PRN Ival Bible, MD       pantoprazole (PROTONIX) EC tablet 40 mg  40 mg Oral Daily Rankin, Shuvon B, NP   40 mg at 01/29/22 0902   polyethylene glycol (MIRALAX / GLYCOLAX) packet 17 g  17 g Oral Daily France Ravens, MD   17 g at 01/29/22 0902   senna-docusate (Senokot-S) tablet 1 tablet  1 tablet Oral Daily France Ravens, MD   1 tablet at 01/29/22 0902   sertraline (ZOLOFT) tablet 25 mg  25 mg Oral Daily France Ravens, MD   25 mg at 01/29/22 0901   tamsulosin (FLOMAX) capsule 0.4 mg  0.4 mg Oral Daily PRN Rankin, Shuvon B, NP       traZODone (DESYREL) tablet 50 mg  50 mg Oral Aliene Altes, MD   50 mg at 01/28/22 2035   Current Outpatient Medications  Medication Sig Dispense Refill   amLODipine  (NORVASC) 10 MG tablet Take 1 tablet (10 mg total) by mouth daily. 90 tablet 1   aspirin EC 81 MG tablet Take 81 mg by mouth daily. Swallow whole.     atorvastatin (LIPITOR) 80 MG tablet Take 80 mg by mouth at bedtime.     carvedilol (COREG) 6.25 MG tablet Take 1 tablet (6.25 mg total) by mouth 2 (two) times daily with a meal. 180 tablet 1   finasteride (PROSCAR) 5 MG tablet Take 5 mg by mouth daily.     Glucagon (GVOKE HYPOPEN 2-PACK) 1 MG/0.2ML SOAJ Inject 1 Act into the skin daily as needed. 2 mL 5   levETIRAcetam (KEPPRA) 500 MG tablet Take 500 mg by mouth in the morning and at bedtime.     metoprolol tartrate (LOPRESSOR) 25 MG tablet Take 12.5 mg by mouth 2 (two) times daily.     multivitamin-iron-minerals-folic acid (CENTRUM) chewable tablet Chew  1 tablet by mouth daily.     omeprazole (PRILOSEC) 40 MG capsule Use Prilosec (omeprazole) 40 mg PO BID for 6 weeks to promote mucosal healing, then reduce to 40 mg daily and continue to titrate to lowest effective dose if abdominal symptoms resolved. (Patient not taking: Reported on 01/26/2022) 180 capsule 1   sertraline (ZOLOFT) 25 MG tablet Take 1 tablet (25 mg total) by mouth daily. 30 tablet 0   SOLIQUA 100-33 UNT-MCG/ML SOPN Inject 50 Units into the skin daily. (Patient taking differently: Inject 60 Units into the skin daily.) 15 mL 0   tamsulosin (FLOMAX) 0.4 MG CAPS capsule Take 1 capsule (0.4 mg total) by mouth daily. (Patient taking differently: Take 0.4 mg by mouth daily as needed.) 90 capsule 1    PTA Medications: (Not in a hospital admission)      01/27/2022    8:30 AM 01/26/2022   12:33 PM 05/17/2021    2:25 PM  Depression screen PHQ 2/9  Decreased Interest 1 0 0  Down, Depressed, Hopeless 1 0 0  PHQ - 2 Score 2 0 0  Altered sleeping 1    Tired, decreased energy 1    Change in appetite 1    Feeling bad or failure about yourself  1    Trouble concentrating 1    Moving slowly or fidgety/restless 0    Suicidal thoughts 0     PHQ-9 Score 7    Difficult doing work/chores Somewhat difficult      Flowsheet Row ED from 01/26/2022 in [facility] Most recent reading at 01/26/2022  7:02 PM ED from 01/26/2022 in [facility] Most recent reading at 01/26/2022  4:24 PM ED from 01/26/2022 in [facility] Most recent reading at 01/26/2022  3:58 PM  C-SSRS RISK CATEGORY No Risk No Risk No Risk       Musculoskeletal  Strength & Muscle Tone: within normal limits Gait & Station: normal Patient leans: N/A  Psychiatric Specialty Exam  Presentation  General Appearance: Appropriate for Environment  Eye Contact:Fair  Speech:Clear and Coherent  Speech Volume:Normal  Handedness:Right   Mood and Affect  Mood:Dysphoric  Affect:Congruent   Thought Process  Thought Processes:Coherent; Goal Directed  Descriptions of Associations:Intact  Orientation:Full (Time, Place and Person)  Thought Content:Logical  Diagnosis of Schizophrenia or Schizoaffective disorder in past: No    Hallucinations:Hallucinations: None  Ideas of Reference:None  Suicidal Thoughts:Suicidal Thoughts: No  Homicidal Thoughts:Homicidal Thoughts: No   Sensorium  Memory:Immediate Fair; Recent Fair; Remote Fair  Judgment:Intact  Insight:Present; Fair   Community education officer  Concentration:Fair  Attention Span:Fair  [place]   Psychomotor Activity  Psychomotor Activity:Psychomotor Activity: Normal   Assets  Assets:Communication Skills; Desire for Improvement; Financial Resources/Insurance; Leisure Time; Social Support; Resilience   Sleep  Sleep:Sleep: Fair   No data recorded  Physical Exam  Physical Exam HENT:     Head: Normocephalic.     Nose: Nose normal.  Eyes:     Conjunctiva/sclera: Conjunctivae normal.  Cardiovascular:     Rate and Rhythm: Normal rate.  Pulmonary:     Effort:  Pulmonary effort is normal.  Musculoskeletal:        General: Normal range of motion.     Cervical back: Normal range of motion.  Neurological:     Mental Status: He is alert and oriented to person, place, and time.    Review of Systems  Constitutional: Negative.  HENT: Negative.    Eyes: Negative.   Respiratory: Negative.    Cardiovascular: Negative.   Gastrointestinal: Negative.   Genitourinary: Negative.   Musculoskeletal: Negative.   Skin: Negative.   Neurological: Negative.   Endo/Heme/Allergies: Negative.    Blood pressure 128/70, pulse 63, temperature 97.9 F (36.6 C), temperature source Tympanic, resp. rate 18, SpO2 100 %. There is no height or weight on file to calculate BMI.  Demographic Factors:  Male, Low socioeconomic status, and Unemployed  Loss Factors: Financial problems/change in socioeconomic status  Historical Factors: NA  Risk Reduction Factors:   Sense of responsibility to family, Living with another person, especially a relative, and Positive social support  Continued Clinical Symptoms:  Alcohol/Substance Abuse/Dependencies  Cognitive Features That Contribute To Risk:  None    Suicide Risk:  Minimal: No identifiable suicidal ideation.  Patients presenting with no risk factors but with morbid ruminations; may be classified as minimal risk based on the severity of the depressive symptoms  Plan Of Care/Follow-up recommendations:  Activity:  as tolerated.  Follow up with list of counseling and substance abuse resources for outpatient services.   Disposition: Discharge to home. Patient picked up by his sister and transported home.    Follow-up Information     Call [employer] at [facility].   Specialty: Internal Medicine Why: Please continue to follow up with Dr. [name] for primary care serices, as well as medication management. Please be prepared to present any discharge paperwork from this encounter, inlcuding a list of  medications precribed. Contact information: [place] [facility] [place] [phone]               Marissa Calamity, NP 01/29/2022, 12:50 PM

## 2022-01-29 NOTE — Group Note (Signed)
Group Topic: Positive Affirmations  Group Date: 01/29/2022 Start Time: 0900 End Time: 0927 Facilitators: Mervyn Skeeters D, NT  Department: [facility]  Number of Participants: 5  Group Focus: affirmation, coping skills, daily focus, problem solving, relapse prevention, and self-esteem Treatment Modality:  Psychoeducation Interventions utilized were assignment Purpose: enhance coping skills, express feelings, reinforce self-care, and relapse prevention strategies  Name: Bradley Hull Date of Birth: [birth year]  MR: [id number]    Level of Participation: moderate Quality of Participation: attentive Interactions with others: gave feedback Mood/Affect: appropriate Triggers (if applicable): n/a Cognition: coherent/clear Progress: Moderate Response: n/a Plan: follow-up needed  Patients Problems:  Patient Active Problem List   Diagnosis Date Noted   Opioid use disorder, severe, dependence ([place]) 01/26/2022   Opioid withdrawal ([place]) 01/26/2022   Substance induced mood disorder ([place]) 01/26/2022   Osteoarthritis of both knees 10/11/2021   Tobacco abuse 07/29/2021   Class 2 severe obesity due to excess calories with serious comorbidity and body mass index (BMI) of 37.0 to 37.9 in adult [facility]) 07/27/2021   Insulin-requiring or dependent type II diabetes mellitus ([place]) 02/10/2021   Deficiency anemia 12/31/2020   Gynecomastia, male 11/13/2020   Essential hypertension 11/08/2020   Encounter for general adult medical examination with abnormal findings 09/30/2020   Hyperlipidemia with target LDL less than 100 09/29/2020   Other cirrhosis of liver ([place]) 03/27/2020   Type 2 diabetes mellitus, uncontrolled, with neuropathy 03/27/2020   Hyperlipidemia associated with type 2 diabetes mellitus ([place]) 03/27/2020   Benign prostatic hyperplasia with lower urinary tract symptoms 03/27/2020   Full code status 03/27/2020   Alcohol abuse 12/02/2013   Chronic hepatitis C ([place])  11/14/2013

## 2022-01-29 NOTE — ED Notes (Signed)
Patient A&O x 4, ambulatory. Patient discharged in no acute distress. Patient denied SI/HI, A/VH upon discharge. Patient verbalized understanding of all discharge instructions explained by staff, to include follow up appointments, RX's and safety plan. Pt belongings returned to patient from locker intact. Patient escorted to lobby via staff for transport to destination. Safety maintained.

## 2022-01-29 NOTE — ED Notes (Signed)
Patient A&Ox4. Denies intent to harm self/others when asked. Denies A/VH. Patient denies any physical complaints when asked. No acute distress noted. Received all am medication without difficulty. Pt insulin pen had only 39 units left. Explained to pt that he would receive the 39 units this am and would receive the remainder 21 units (totaling 60 units as ordered) once received from pharmacy. Pt verbalized understanding and agreement. Pharmacy notified. Routine safety checks conducted according to facility protocol. Encouraged patient to notify staff if thoughts of harm toward self or others arise. Patient verbalize understanding and agreement. Will continue to monitor for safety.

## 2022-01-29 NOTE — Progress Notes (Signed)
CSW followed with [facility]; address [address] [place], [place] [address]/Phone#: [phone]; in reference to provider request via secure chat to see if patient was accepted. It was reported that the patient was declined due to no appropriate beds available.   Glennie [place], MSW, [facility] Phone: [phone] Disposition/TOC

## 2022-01-29 NOTE — Discharge Instructions (Addendum)
Discharge recommendations:  Patient is to take medications as prescribed. Please see information for follow-up appointment with psychiatry and therapy. Please follow up with your primary care provider for all medical related needs.   Therapy: We recommend that patient participate in individual therapy to address mental health concerns.  Medications: The patient is to contact a medical professional and/or outpatient provider to address any new side effects that develop. The patient should update outpatient providers of any new medications and/or medication changes.   Safety:  The patient should abstain from use of illicit substances/drugs and abuse of any medications. If symptoms worsen or do not continue to improve or if the patient becomes actively suicidal or homicidal then it is recommended that the patient return to the closest hospital emergency department, the [facility] or call 911 for further evaluation and treatment. National Suicide Prevention Lifeline 1-800-SUICIDE or 442-230-9174.  About 988 988 offers 24/7 access to trained crisis counselors who can help people experiencing mental health-related distress. People can call or text 988 or chat 988lifeline.org for themselves or if they are worried about a loved one who may need crisis support.

## 2022-01-30 LAB — GLUCOSE, CAPILLARY: Glucose-Capillary: 236 mg/dL — ABNORMAL HIGH (ref 70–99)

## 2022-02-02 ENCOUNTER — Other Ambulatory Visit: Payer: Self-pay | Admitting: Internal Medicine

## 2022-02-02 ENCOUNTER — Telehealth: Payer: Self-pay | Admitting: Internal Medicine

## 2022-02-02 DIAGNOSIS — I10 Essential (primary) hypertension: Secondary | ICD-10-CM

## 2022-02-02 MED ORDER — CARVEDILOL 6.25 MG PO TABS: 6.2500 mg | ORAL_TABLET | Freq: Two times a day (BID) | ORAL | 0 refills | Status: DC

## 2022-02-02 NOTE — Telephone Encounter (Signed)
1.Medication Requested: carvedilol (COREG) 6.25 MG tablet 2. Pharmacy (Name, Street, [place]): [place], [place] Phone:  [phone]  Fax:  [phone]     3. On Med List: yes  4. Last Visit with PCP: 3.20.23 (another provider)  5. Next visit date with PCP: 8.8.2023  Agent: Please be advised that RX refills may take up to 3 business days. We ask that you follow-up with your pharmacy.

## 2022-02-09 ENCOUNTER — Other Ambulatory Visit: Payer: Self-pay | Admitting: Internal Medicine

## 2022-02-09 DIAGNOSIS — E119 Type 2 diabetes mellitus without complications: Secondary | ICD-10-CM

## 2022-03-01 ENCOUNTER — Ambulatory Visit: Payer: 59 | Admitting: Internal Medicine

## 2022-03-12 IMAGING — MG DIGITAL DIAGNOSTIC BILAT W/ TOMO W/ CAD
6 of 12 series · 6 of 36 positions shown · non-contrast
Comparison: None.

CLINICAL DATA: Patient reports RIGHT breast mass noted 2 months
ago. Patient has had numerous changes in medications. Recently, he
has been diagnosed with prostate cancer and starts therapy soon.

EXAM:
DIGITAL DIAGNOSTIC BILATERAL MAMMOGRAM WITH TOMOSYNTHESIS AND CAD
TECHNIQUE: Bilateral digital diagnostic mammography and breast tomosynthesis
was performed. The images were evaluated with computer-aided
detection.

[L MLO synth-2D]
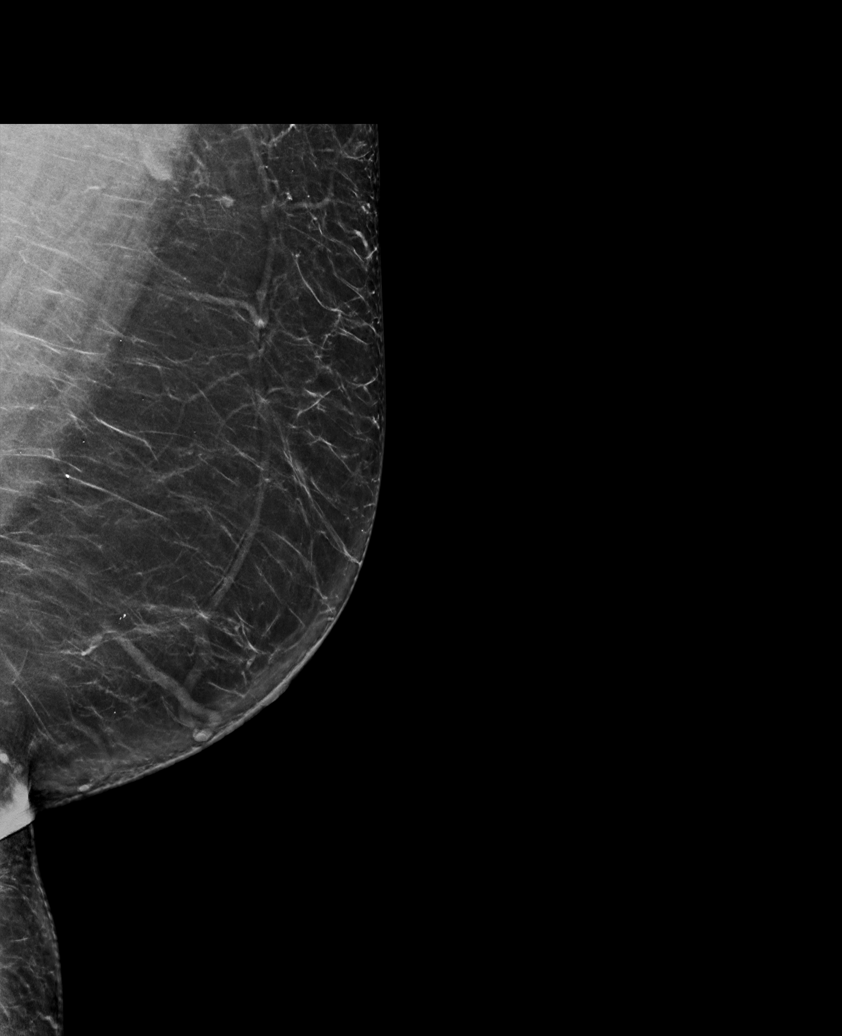

[L CC synth-2D (1 of 2)]
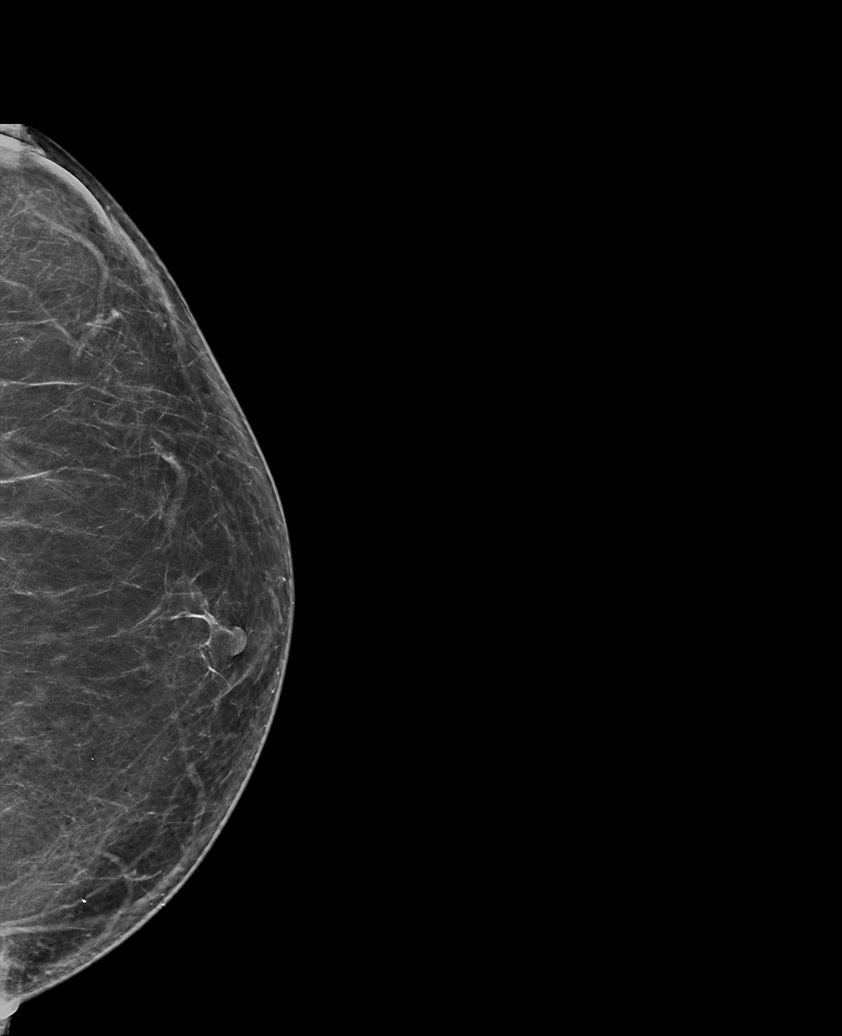

[R MLO synth-2D (1 of 2)]
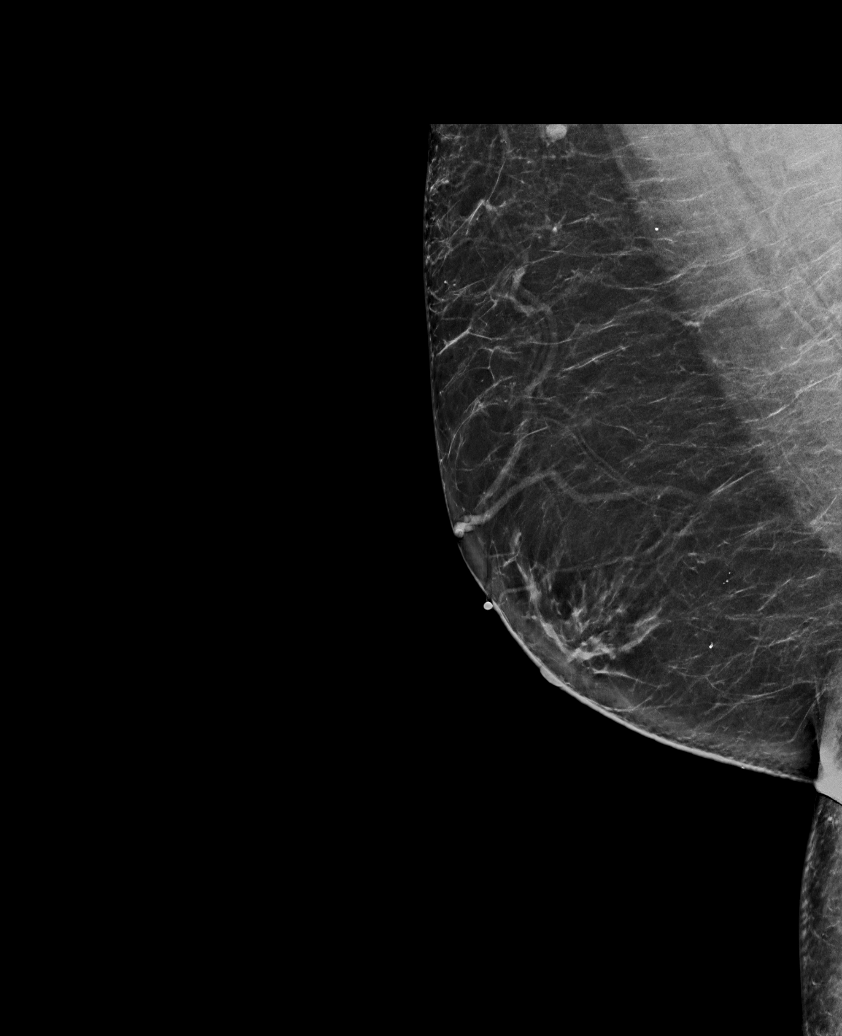

[R CC synth-2D]
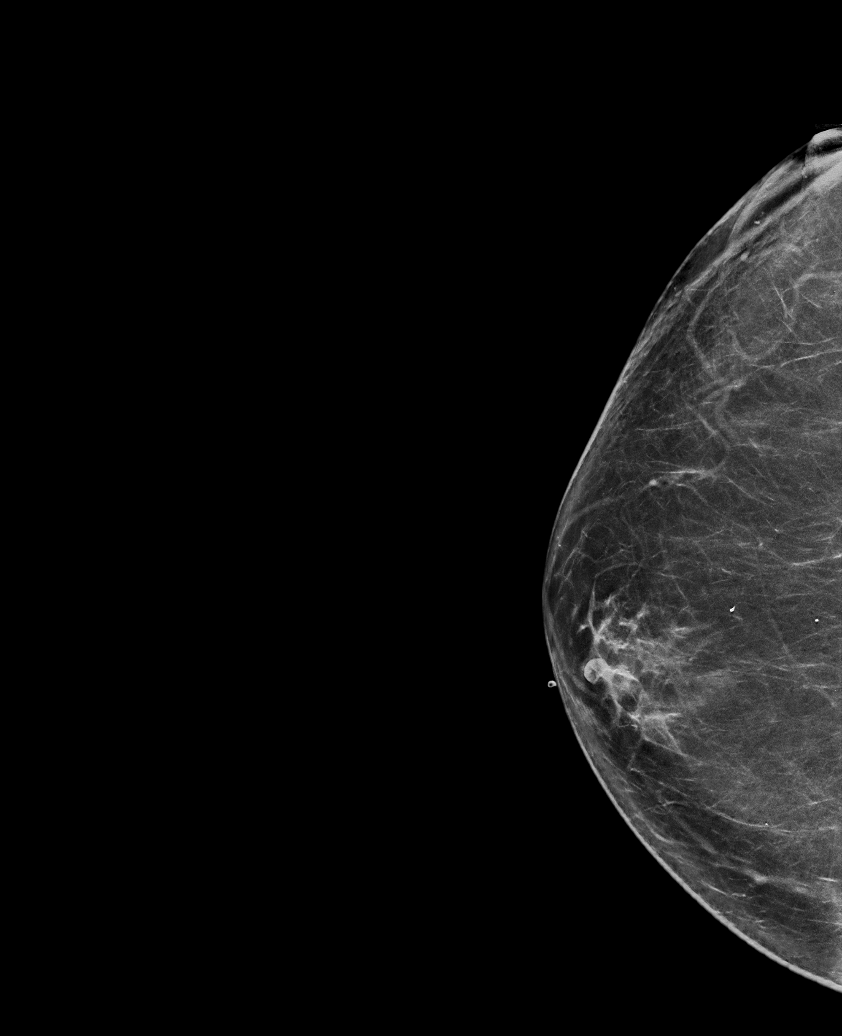

[R MLO synth-2D (2 of 2)]
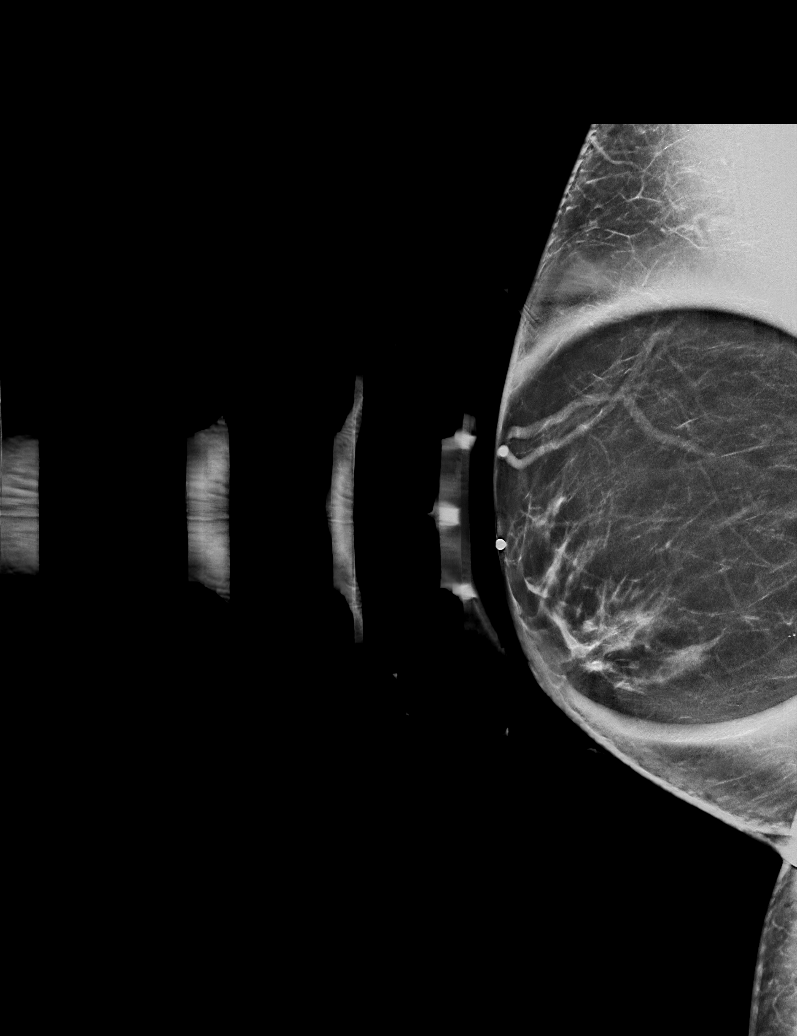

[L CC synth-2D (2 of 2)]
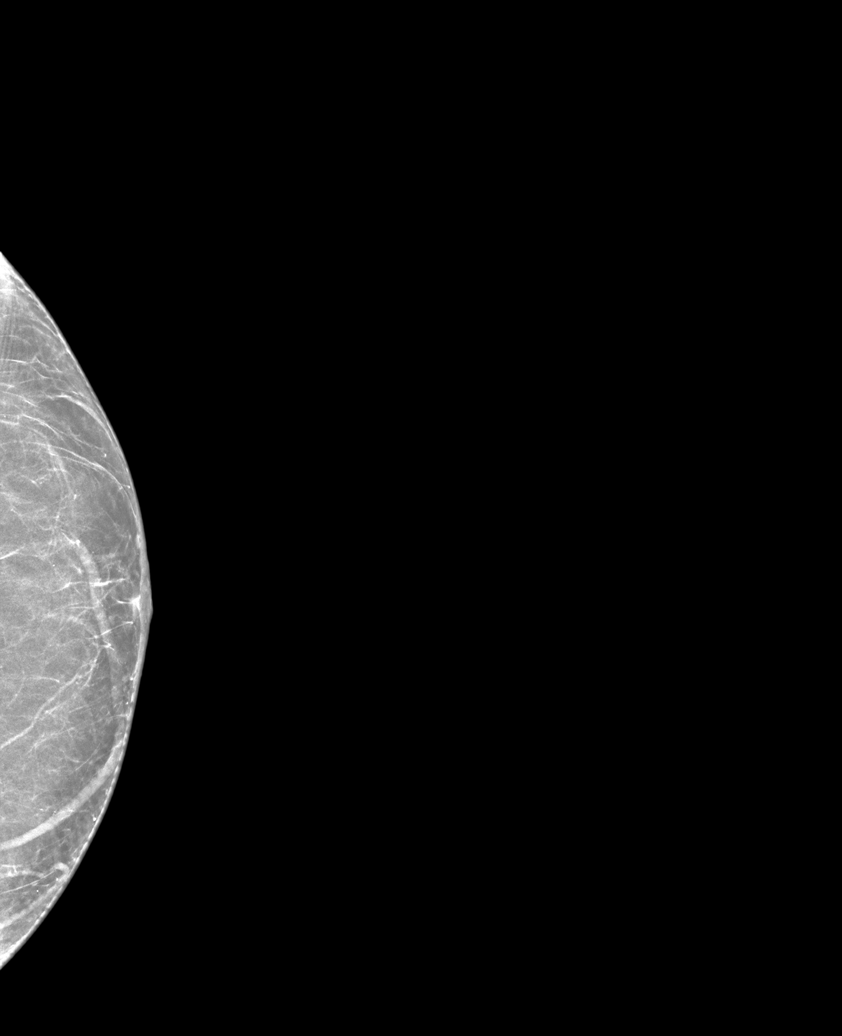

[6 of 36 positions shown; findings below may reference images not displayed]

ACR Breast Density Category b: There are scattered areas of
fibroglandular density.
FINDINGS: There is asymmetric fibroglandular tissue in the retroareolar region
of the RIGHT breast corresponding to the area of patient's concern.
No suspicious mass, distortion, or microcalcifications are
identified in either breast. The LEFT breast is primarily fatty
replaced.

On physical exam I palpate soft nonfocal thickening in the
retroareolar region of the RIGHT breast, asymmetric compared to the
LEFT. There is no associated tenderness on exam. I palpate no
discrete mass.
IMPRESSION: Findings consistent with benign gynecomastia. No mammographic
evidence for malignancy.

Gynecomastia is common, and can occur with changes in the
testosterone:estrogen ratio. Potential causes include numerous
prescription medications and recreational drugs (marijuana and
anabolic steroids in particular). Approximately 2% of patients with
gynecomastia have testicular tumors. 20% of cases are idiopathic.
Gynecomastia does not increase the risk for breast cancer.

RECOMMENDATION:
Recommend correlation with testicular exam and possible testicular
ultrasound. Consider evaluation of serum estradiol, alpha
fetoprotein, and human chorionic gonadotrophin levels. Surgical
consultation for possible excision can be considered if symptoms
become problematic.

I have discussed the findings and recommendations with the patient.
If applicable, a reminder letter will be sent to the patient
regarding the next appointment.

BI-RADS CATEGORY  2: Benign.

## 2022-03-23 ENCOUNTER — Other Ambulatory Visit: Payer: Self-pay | Admitting: Internal Medicine

## 2022-03-23 DIAGNOSIS — I10 Essential (primary) hypertension: Secondary | ICD-10-CM

## 2022-03-29 ENCOUNTER — Telehealth: Payer: Self-pay | Admitting: Internal Medicine

## 2022-03-29 ENCOUNTER — Other Ambulatory Visit: Payer: Self-pay | Admitting: Internal Medicine

## 2022-03-29 DIAGNOSIS — I10 Essential (primary) hypertension: Secondary | ICD-10-CM

## 2022-03-29 MED ORDER — AMLODIPINE BESYLATE 10 MG PO TABS: 10.0000 mg | ORAL_TABLET | Freq: Every day | ORAL | 0 refills | Status: DC

## 2022-03-29 NOTE — Telephone Encounter (Signed)
Patient needs his amlodopine refilled - Please send to [facility]

## 2022-03-29 NOTE — Telephone Encounter (Signed)
Patient has an appointment scheduled for 04/12/2022.  He is completely out of his medication and would like enough sent to get him to this appointment.

## 2022-04-05 DIAGNOSIS — G894 Chronic pain syndrome: Secondary | ICD-10-CM | POA: Diagnosis not present

## 2022-04-05 DIAGNOSIS — M179 Osteoarthritis of knee, unspecified: Secondary | ICD-10-CM | POA: Diagnosis not present

## 2022-04-05 DIAGNOSIS — M5136 Other intervertebral disc degeneration, lumbar region: Secondary | ICD-10-CM | POA: Diagnosis not present

## 2022-04-12 ENCOUNTER — Ambulatory Visit (INDEPENDENT_AMBULATORY_CARE_PROVIDER_SITE_OTHER): Payer: Medicare Other | Admitting: Internal Medicine

## 2022-04-12 ENCOUNTER — Encounter: Payer: Self-pay | Admitting: Internal Medicine

## 2022-04-12 VITALS — BP 138/78 | HR 90 | Temp 98.8°F | Resp 16 | Ht 65.0 in | Wt 226.0 lb

## 2022-04-12 DIAGNOSIS — Z794 Long term (current) use of insulin: Secondary | ICD-10-CM | POA: Diagnosis not present

## 2022-04-12 DIAGNOSIS — D539 Nutritional anemia, unspecified: Secondary | ICD-10-CM

## 2022-04-12 DIAGNOSIS — E119 Type 2 diabetes mellitus without complications: Secondary | ICD-10-CM

## 2022-04-12 DIAGNOSIS — R0609 Other forms of dyspnea: Secondary | ICD-10-CM | POA: Diagnosis not present

## 2022-04-12 DIAGNOSIS — R9431 Abnormal electrocardiogram [ECG] [EKG]: Secondary | ICD-10-CM | POA: Diagnosis not present

## 2022-04-12 DIAGNOSIS — Z23 Encounter for immunization: Secondary | ICD-10-CM | POA: Diagnosis not present

## 2022-04-12 DIAGNOSIS — I10 Essential (primary) hypertension: Secondary | ICD-10-CM

## 2022-04-12 LAB — CBC WITH DIFFERENTIAL/PLATELET
Basophils Absolute: 0 10*3/uL (ref 0.0–0.1)
Basophils Relative: 0.5 % (ref 0.0–3.0)
Eosinophils Absolute: 0.4 10*3/uL (ref 0.0–0.7)
Eosinophils Relative: 4.7 % (ref 0.0–5.0)
HCT: 35.1 % — ABNORMAL LOW (ref 39.0–52.0)
Hemoglobin: 11.8 g/dL — ABNORMAL LOW (ref 13.0–17.0)
Lymphocytes Relative: 14.1 % (ref 12.0–46.0)
Lymphs Abs: 1.1 10*3/uL (ref 0.7–4.0)
MCHC: 33.8 g/dL (ref 30.0–36.0)
MCV: 89.9 fl (ref 78.0–100.0)
Monocytes Absolute: 0.4 10*3/uL (ref 0.1–1.0)
Monocytes Relative: 4.5 % (ref 3.0–12.0)
Neutro Abs: 6.2 10*3/uL (ref 1.4–7.7)
Neutrophils Relative %: 76.2 % (ref 43.0–77.0)
Platelets: 253 10*3/uL (ref 150.0–400.0)
RBC: 3.9 Mil/uL — ABNORMAL LOW (ref 4.22–5.81)
RDW: 15 % (ref 11.5–15.5)
WBC: 8.1 10*3/uL (ref 4.0–10.5)

## 2022-04-12 LAB — BASIC METABOLIC PANEL
BUN: 13 mg/dL (ref 6–23)
CO2: 26 mEq/L (ref 19–32)
Calcium: 9.4 mg/dL (ref 8.4–10.5)
Chloride: 108 mEq/L (ref 96–112)
Creatinine, Ser: 0.77 mg/dL (ref 0.40–1.50)
GFR: 91.58 mL/min (ref >60.00)
Glucose, Bld: 88 mg/dL (ref 70–99)
Potassium: 3.9 mEq/L (ref 3.5–5.1)
Sodium: 143 mEq/L (ref 135–145)

## 2022-04-12 LAB — IBC + FERRITIN
Ferritin: 30.1 ng/mL (ref 22.0–322.0)
Iron: 56 ug/dL (ref 42–165)
Saturation Ratios: 11.5 % — ABNORMAL LOW (ref 20.0–50.0)
TIBC: 485.8 ug/dL — ABNORMAL HIGH (ref 250.0–450.0)
Transferrin: 347 mg/dL (ref 212.0–360.0)

## 2022-04-12 LAB — TROPONIN I (HIGH SENSITIVITY): High Sens Troponin I: 3 ng/L (ref 2–17)

## 2022-04-12 LAB — BRAIN NATRIURETIC PEPTIDE: Pro B Natriuretic peptide (BNP): 28 pg/mL (ref 0.0–100.0)

## 2022-04-12 LAB — VITAMIN B12: Vitamin B-12: 299 pg/mL (ref 211–911)

## 2022-04-12 LAB — PSA: PSA: 0

## 2022-04-12 LAB — FOLATE: Folate: 14.5 ng/mL (ref >5.9)

## 2022-04-12 NOTE — Patient Instructions (Signed)

## 2022-04-12 NOTE — Progress Notes (Unsigned)
Subjective:  Patient ID: Bradley Hull, male    DOB: Aug 01, 1950  Age: 71 y.o. MRN: [id number]  CC: Hypertension, Diabetes, and Hyperlipidemia   HPI Bradley Hull presents for f/up - Since I last saw him he developed dyspnea on exertion.  He denies chest pain, diaphoresis, or edema.  Outpatient Medications Prior to Visit  Medication Sig Dispense Refill   amLODipine (NORVASC) 10 MG tablet Take 1 tablet (10 mg total) by mouth daily. 90 tablet 0   aspirin EC 81 MG tablet Take 81 mg by mouth daily. Swallow whole.     atorvastatin (LIPITOR) 80 MG tablet Take 80 mg by mouth at bedtime.     carvedilol (COREG) 6.25 MG tablet TAKE 1 TABLET BY MOUTH TWICE DAILY WITH A MEAL 180 tablet 0   finasteride (PROSCAR) 5 MG tablet Take 5 mg by mouth daily.     Glucagon (GVOKE HYPOPEN 2-PACK) 1 MG/0.2ML SOAJ Inject 1 Act into the skin daily as needed. 2 mL 5   levETIRAcetam (KEPPRA) 500 MG tablet Take 500 mg by mouth in the morning and at bedtime.     multivitamin-iron-minerals-folic acid (CENTRUM) chewable tablet Chew 1 tablet by mouth daily.     SOLIQUA 100-33 UNT-MCG/ML SOPN Inject 60 Units into the skin daily. 54 mL 0   tamsulosin (FLOMAX) 0.4 MG CAPS capsule Take 1 capsule (0.4 mg total) by mouth daily. (Patient taking differently: Take 0.4 mg by mouth daily as needed.) 90 capsule 1   omeprazole (PRILOSEC) 40 MG capsule Use Prilosec (omeprazole) 40 mg PO BID for 6 weeks to promote mucosal healing, then reduce to 40 mg daily and continue to titrate to lowest effective dose if abdominal symptoms resolved. (Patient not taking: Reported on 01/26/2022) 180 capsule 1   sertraline (ZOLOFT) 25 MG tablet Take 1 tablet (25 mg total) by mouth daily. 30 tablet 0   No facility-administered medications prior to visit.    ROS Review of Systems  Constitutional:  Negative for diaphoresis and fatigue.  HENT: Negative.    Respiratory:  Positive for shortness of breath. Negative for cough, chest tightness and  wheezing.   Cardiovascular:  Negative for chest pain, palpitations and leg swelling.  Gastrointestinal:  Negative for abdominal pain, constipation, diarrhea, nausea and vomiting.  Genitourinary: Negative.  Negative for difficulty urinating.  Musculoskeletal: Negative.  Negative for arthralgias and myalgias.  Skin: Negative.   Neurological:  Negative for dizziness, weakness and light-headedness.  Hematological:  Negative for adenopathy. Does not bruise/bleed easily.  Psychiatric/Behavioral: Negative.      Objective:  BP 138/78 (BP Location: Left Arm, Patient Position: Sitting, Cuff Size: Normal)   Pulse 90   Temp 98.8 F (37.1 C) (Oral)   Resp 16   Ht '5\' 5"'$  (1.651 m)   Wt 226 lb (102.5 kg)   SpO2 98%   BMI 37.61 kg/m   BP Readings from Last 3 Encounters:  04/12/22 138/78  01/26/22 119/66  10/11/21 134/80    Wt Readings from Last 3 Encounters:  04/12/22 226 lb (102.5 kg)  01/26/22 225 lb 5 oz (102.2 kg)  10/11/21 225 lb 6.4 oz (102.2 kg)    Physical Exam Vitals reviewed.  HENT:     Mouth/Throat:     Mouth: Mucous membranes are moist.  Eyes:     General: No scleral icterus.    Conjunctiva/sclera: Conjunctivae normal.  Cardiovascular:     Rate and Rhythm: Normal rate and regular rhythm.     Heart sounds: Normal heart  sounds, S1 normal and S2 normal. No murmur heard.    Comments: EKG- NSR, 70 bpm Septal infarct pattern is not new ???inf/lateral infarct pattern is old No LVH Pulmonary:     Effort: Pulmonary effort is normal.     Breath sounds: No stridor. No wheezing, rhonchi or rales.  Abdominal:     General: Abdomen is flat.     Palpations: There is no mass.     Tenderness: There is no abdominal tenderness. There is no guarding.     Hernia: No hernia is present.  Musculoskeletal:     Right lower leg: No edema.     Left lower leg: No edema.  Skin:    General: Skin is warm and dry.  Neurological:     General: No focal deficit present.     Mental Status: He  is alert. Mental status is at baseline.     Lab Results  Component Value Date   WBC 8.1 04/12/2022   HGB 11.8 (L) 04/12/2022   HCT 35.1 (L) 04/12/2022   PLT 253.0 04/12/2022   GLUCOSE 88 04/12/2022   CHOL 97 01/26/2022   TRIG 104 01/26/2022   HDL 45 01/26/2022   LDLCALC 31 01/26/2022   ALT 17 01/26/2022   AST 21 01/26/2022   NA 143 04/12/2022   K 3.9 04/12/2022   CL 108 04/12/2022   CREATININE 0.77 04/12/2022   BUN 13 04/12/2022   CO2 26 04/12/2022   TSH 1.03 02/04/2021   PSA 0.28 05/04/2021   INR 1.2 09/09/2020   HGBA1C 6.4 (H) 01/26/2022   MICROALBUR 4.9 (H) 07/27/2021    No results found.  Assessment & Plan:   Bradley Hull was seen today for hypertension, diabetes and hyperlipidemia.  Diagnoses and all orders for this visit:  Flu vaccine need -     Flu Vaccine QUAD High Dose(Fluad)  DOE (dyspnea on exertion)- His enzymes are reassuring but EKG is abnormal.  I recommended a myocardial perfusion imaging. -     EKG 12-Lead -     Brain natriuretic peptide; Future -     Troponin I (High Sensitivity); Future -     Troponin I (High Sensitivity) -     Brain natriuretic peptide -     MYOCARDIAL PERFUSION IMAGING; Future -     Cardiac Stress Test: Informed Consent Details: Physician/Practitioner Attestation; Transcribe to consent form and obtain patient signature; Future  Abnormal electrocardiogram (ECG) (EKG) -     Brain natriuretic peptide; Future -     Troponin I (High Sensitivity); Future -     Troponin I (High Sensitivity) -     Brain natriuretic peptide -     MYOCARDIAL PERFUSION IMAGING; Future -     Cardiac Stress Test: Informed Consent Details: Physician/Practitioner Attestation; Transcribe to consent form and obtain patient signature; Future  Deficiency anemia- Will evaluate for vitamin deficiencies. -     IBC + Ferritin; Future -     Reticulocytes; Future -     Vitamin B12; Future -     CBC with Differential/Platelet; Future -     Folate; Future -      Vitamin B1; Future -     Zinc; Future -     Zinc -     Vitamin B1 -     Folate -     CBC with Differential/Platelet -     Vitamin B12 -     Reticulocytes -     IBC + Ferritin  Essential hypertension-  His blood pressure is adequately well controlled. -     Basic metabolic panel; Future -     Basic metabolic panel  Insulin-requiring or dependent type II diabetes mellitus ([place]) -     HM Diabetes Foot Exam -     Ambulatory referral to Ophthalmology  Abnormal electrocardiogram -     MYOCARDIAL PERFUSION IMAGING; Future -     Cardiac Stress Test: Informed Consent Details: Physician/Practitioner Attestation; Transcribe to consent form and obtain patient signature; Future   I am having Bradley Hull maintain his omeprazole, tamsulosin, finasteride, multivitamin-iron-minerals-folic acid, Gvoke HypoPen 2-Pack, levETIRAcetam, atorvastatin, aspirin EC, sertraline, Soliqua, carvedilol, and amLODipine.  No orders of the defined types were placed in this encounter.    Follow-up: Return in about 3 months (around 07/12/2022).  Scarlette Calico, MD

## 2022-04-14 ENCOUNTER — Telehealth: Payer: Self-pay

## 2022-04-14 MED ORDER — SHINGRIX 50 MCG/0.5ML IM SUSR: 0.5000 mL | Freq: Once | INTRAMUSCULAR | 1 refills | Status: AC

## 2022-04-14 NOTE — Telephone Encounter (Signed)
Patient is calling in wanting to know if his lab results are back.

## 2022-04-17 LAB — ZINC: Zinc: 95 ug/dL (ref 60–130)

## 2022-04-17 LAB — RETICULOCYTES
ABS Retic: 53760 cells/uL (ref 25000–90000)
Retic Ct Pct: 1.4 %

## 2022-04-17 LAB — VITAMIN B1: Vitamin B1 (Thiamine): 15 nmol/L (ref 8–30)

## 2022-04-20 DIAGNOSIS — R350 Frequency of micturition: Secondary | ICD-10-CM | POA: Diagnosis not present

## 2022-04-20 NOTE — Telephone Encounter (Signed)
Called pt, LVM.   

## 2022-04-25 ENCOUNTER — Telehealth: Payer: Self-pay | Admitting: Internal Medicine

## 2022-04-25 NOTE — Telephone Encounter (Signed)
Left message for patient to call back and schedule Medicare Annual Wellness Visit (AWV).   Please offer to do virtually or by telephone.  Left office number and my jabber #[phone].  AWVI eligible as of 01/23/2019  Please schedule at anytime with Nurse Health Advisor.  

## 2022-05-03 DIAGNOSIS — M5136 Other intervertebral disc degeneration, lumbar region: Secondary | ICD-10-CM | POA: Diagnosis not present

## 2022-05-03 DIAGNOSIS — M179 Osteoarthritis of knee, unspecified: Secondary | ICD-10-CM | POA: Diagnosis not present

## 2022-05-03 DIAGNOSIS — G894 Chronic pain syndrome: Secondary | ICD-10-CM | POA: Diagnosis not present

## 2022-05-05 ENCOUNTER — Other Ambulatory Visit: Payer: Self-pay | Admitting: Nurse Practitioner

## 2022-05-05 DIAGNOSIS — I8510 Secondary esophageal varices without bleeding: Secondary | ICD-10-CM | POA: Diagnosis not present

## 2022-05-05 DIAGNOSIS — K7469 Other cirrhosis of liver: Secondary | ICD-10-CM | POA: Diagnosis not present

## 2022-05-10 ENCOUNTER — Ambulatory Visit
Admission: RE | Admit: 2022-05-10 | Discharge: 2022-05-10 | Disposition: A | Discharge status: Home / Self Care | Payer: Medicare Other | Source: Ambulatory Visit | Attending: Nurse Practitioner | Admitting: Nurse Practitioner

## 2022-05-10 DIAGNOSIS — K7460 Unspecified cirrhosis of liver: Secondary | ICD-10-CM | POA: Diagnosis not present

## 2022-05-10 DIAGNOSIS — N281 Cyst of kidney, acquired: Secondary | ICD-10-CM | POA: Diagnosis not present

## 2022-05-10 DIAGNOSIS — K7469 Other cirrhosis of liver: Secondary | ICD-10-CM

## 2022-05-10 DIAGNOSIS — I8510 Secondary esophageal varices without bleeding: Secondary | ICD-10-CM

## 2022-05-12 ENCOUNTER — Other Ambulatory Visit: Payer: Self-pay | Admitting: Internal Medicine

## 2022-05-12 DIAGNOSIS — Z794 Long term (current) use of insulin: Secondary | ICD-10-CM

## 2022-05-15 ENCOUNTER — Other Ambulatory Visit: Payer: Self-pay | Admitting: Internal Medicine

## 2022-05-30 DIAGNOSIS — G894 Chronic pain syndrome: Secondary | ICD-10-CM | POA: Diagnosis not present

## 2022-05-30 DIAGNOSIS — M5136 Other intervertebral disc degeneration, lumbar region: Secondary | ICD-10-CM | POA: Diagnosis not present

## 2022-05-30 DIAGNOSIS — M179 Osteoarthritis of knee, unspecified: Secondary | ICD-10-CM | POA: Diagnosis not present

## 2022-06-13 ENCOUNTER — Telehealth: Payer: Self-pay | Admitting: Internal Medicine

## 2022-06-13 DIAGNOSIS — Z5181 Encounter for therapeutic drug level monitoring: Secondary | ICD-10-CM | POA: Diagnosis not present

## 2022-06-13 NOTE — Telephone Encounter (Signed)
Caller & Relationship to patient:Bradley Hull   Call back [phone]   Date of last office visit:04/12/22   Date of next office visit:not scheduled yet   Medication(s) to be refilled: Patient wanted to switch out his disk reader on his arm with the reader, test strips and lancets. atorvastatin (LIPITOR) 80 MG tablet carvedilol (COREG) 6.25 MG tablet  amLODipine (NORVASC) 10 MG tablet  Metoprolol       Preferred Pharmacy: [facility] in [place]

## 2022-06-15 ENCOUNTER — Other Ambulatory Visit: Payer: Self-pay | Admitting: Internal Medicine

## 2022-06-15 DIAGNOSIS — I10 Essential (primary) hypertension: Secondary | ICD-10-CM

## 2022-06-15 DIAGNOSIS — E785 Hyperlipidemia, unspecified: Secondary | ICD-10-CM

## 2022-06-16 ENCOUNTER — Other Ambulatory Visit: Payer: Self-pay | Admitting: Internal Medicine

## 2022-06-16 ENCOUNTER — Emergency Department (HOSPITAL_COMMUNITY)
Admission: EM | Admit: 2022-06-16 | Discharge: 2022-06-16 | Disposition: A | Discharge status: Home / Self Care | Payer: Medicare Other | Attending: Emergency Medicine | Admitting: Emergency Medicine

## 2022-06-16 ENCOUNTER — Emergency Department (HOSPITAL_COMMUNITY): Payer: Medicare Other

## 2022-06-16 ENCOUNTER — Other Ambulatory Visit: Payer: Self-pay

## 2022-06-16 ENCOUNTER — Encounter (HOSPITAL_COMMUNITY): Payer: Self-pay

## 2022-06-16 DIAGNOSIS — R197 Diarrhea, unspecified: Secondary | ICD-10-CM | POA: Insufficient documentation

## 2022-06-16 DIAGNOSIS — E119 Type 2 diabetes mellitus without complications: Secondary | ICD-10-CM | POA: Insufficient documentation

## 2022-06-16 DIAGNOSIS — N132 Hydronephrosis with renal and ureteral calculous obstruction: Secondary | ICD-10-CM | POA: Diagnosis not present

## 2022-06-16 DIAGNOSIS — E785 Hyperlipidemia, unspecified: Secondary | ICD-10-CM

## 2022-06-16 DIAGNOSIS — R110 Nausea: Secondary | ICD-10-CM | POA: Diagnosis not present

## 2022-06-16 DIAGNOSIS — R112 Nausea with vomiting, unspecified: Secondary | ICD-10-CM | POA: Diagnosis not present

## 2022-06-16 DIAGNOSIS — Z7982 Long term (current) use of aspirin: Secondary | ICD-10-CM | POA: Insufficient documentation

## 2022-06-16 DIAGNOSIS — R160 Hepatomegaly, not elsewhere classified: Secondary | ICD-10-CM | POA: Diagnosis not present

## 2022-06-16 DIAGNOSIS — Z794 Long term (current) use of insulin: Secondary | ICD-10-CM | POA: Insufficient documentation

## 2022-06-16 DIAGNOSIS — Z8546 Personal history of malignant neoplasm of prostate: Secondary | ICD-10-CM | POA: Diagnosis not present

## 2022-06-16 DIAGNOSIS — I959 Hypotension, unspecified: Secondary | ICD-10-CM | POA: Diagnosis not present

## 2022-06-16 DIAGNOSIS — R1111 Vomiting without nausea: Secondary | ICD-10-CM | POA: Diagnosis not present

## 2022-06-16 DIAGNOSIS — I10 Essential (primary) hypertension: Secondary | ICD-10-CM | POA: Diagnosis not present

## 2022-06-16 DIAGNOSIS — R109 Unspecified abdominal pain: Secondary | ICD-10-CM | POA: Insufficient documentation

## 2022-06-16 DIAGNOSIS — N281 Cyst of kidney, acquired: Secondary | ICD-10-CM | POA: Diagnosis not present

## 2022-06-16 DIAGNOSIS — Z79899 Other long term (current) drug therapy: Secondary | ICD-10-CM | POA: Diagnosis not present

## 2022-06-16 DIAGNOSIS — T68XXXA Hypothermia, initial encounter: Secondary | ICD-10-CM | POA: Diagnosis not present

## 2022-06-16 LAB — CBG MONITORING, ED: Glucose-Capillary: 195 mg/dL — ABNORMAL HIGH (ref 70–99)

## 2022-06-16 LAB — COMPREHENSIVE METABOLIC PANEL
ALT: 16 U/L (ref 0–44)
AST: 18 U/L (ref 15–41)
Albumin: 4.1 g/dL (ref 3.5–5.0)
Alkaline Phosphatase: 85 U/L (ref 38–126)
Anion gap: 7 (ref 5–15)
BUN: 8 mg/dL (ref 8–23)
CO2: 25 mmol/L (ref 22–32)
Calcium: 9.5 mg/dL (ref 8.9–10.3)
Chloride: 109 mmol/L (ref 98–111)
Creatinine, Ser: 0.96 mg/dL (ref 0.61–1.24)
GFR, Estimated: >60 mL/min (ref >60)
Glucose, Bld: 180 mg/dL — ABNORMAL HIGH (ref 70–99)
Potassium: 3.5 mmol/L (ref 3.5–5.1)
Sodium: 141 mmol/L (ref 135–145)
Total Bilirubin: 0.9 mg/dL (ref 0.3–1.2)
Total Protein: 8 g/dL (ref 6.5–8.1)

## 2022-06-16 LAB — URINALYSIS, ROUTINE W REFLEX MICROSCOPIC
Bilirubin Urine: NEGATIVE
Glucose, UA: NEGATIVE mg/dL
Ketones, ur: 5 mg/dL — AB
Leukocytes,Ua: NEGATIVE
Nitrite: NEGATIVE
Protein, ur: NEGATIVE mg/dL
RBC / HPF: >50 RBC/hpf — ABNORMAL HIGH (ref 0–5)
Specific Gravity, Urine: 1.008 (ref 1.005–1.030)
pH: 7 (ref 5.0–8.0)

## 2022-06-16 LAB — CBC WITH DIFFERENTIAL/PLATELET
Abs Immature Granulocytes: 0.04 10*3/uL (ref 0.00–0.07)
Basophils Absolute: 0.1 10*3/uL (ref 0.0–0.1)
Basophils Relative: 1 %
Eosinophils Absolute: 0.2 10*3/uL (ref 0.0–0.5)
Eosinophils Relative: 2 %
HCT: 38.9 % — ABNORMAL LOW (ref 39.0–52.0)
Hemoglobin: 12.9 g/dL — ABNORMAL LOW (ref 13.0–17.0)
Immature Granulocytes: 0 %
Lymphocytes Relative: 10 %
Lymphs Abs: 1 10*3/uL (ref 0.7–4.0)
MCH: 30 pg (ref 26.0–34.0)
MCHC: 33.2 g/dL (ref 30.0–36.0)
MCV: 90.5 fL (ref 80.0–100.0)
Monocytes Absolute: 0.4 10*3/uL (ref 0.1–1.0)
Monocytes Relative: 4 %
Neutro Abs: 9 10*3/uL — ABNORMAL HIGH (ref 1.7–7.7)
Neutrophils Relative %: 83 %
Platelets: 285 10*3/uL (ref 150–400)
RBC: 4.3 MIL/uL (ref 4.22–5.81)
RDW: 14 % (ref 11.5–15.5)
WBC: 10.6 10*3/uL — ABNORMAL HIGH (ref 4.0–10.5)
nRBC: 0 % (ref 0.0–0.2)

## 2022-06-16 LAB — LIPASE, BLOOD: Lipase: 33 U/L (ref 11–51)

## 2022-06-16 MED ORDER — CARVEDILOL 6.25 MG PO TABS: 6.2500 mg | ORAL_TABLET | Freq: Two times a day (BID) | ORAL | 1 refills | Status: DC

## 2022-06-16 MED ORDER — ONDANSETRON 4 MG PO TBDP
4.0000 mg | ORAL_TABLET | Freq: Once | ORAL | Status: AC
  Administered 2022-06-16: 4 mg via ORAL
  Filled 2022-06-16: qty 1

## 2022-06-16 MED ORDER — ATORVASTATIN CALCIUM 80 MG PO TABS: 80.0000 mg | ORAL_TABLET | Freq: Every day | ORAL | 1 refills | Status: DC

## 2022-06-16 MED ORDER — SODIUM CHLORIDE 0.9 % IV BOLUS: 1000.0000 mL | Freq: Once | INTRAVENOUS | Status: DC

## 2022-06-16 MED ORDER — MORPHINE SULFATE (PF) 4 MG/ML IV SOLN
4.0000 mg | Freq: Once | INTRAVENOUS | Status: DC
  Filled 2022-06-16: qty 1

## 2022-06-16 MED ORDER — METOCLOPRAMIDE HCL 5 MG/ML IJ SOLN: 10.0000 mg | Freq: Once | INTRAMUSCULAR | Status: DC

## 2022-06-16 MED ORDER — OXYCODONE-ACETAMINOPHEN 5-325 MG PO TABS: 1.0000 | ORAL_TABLET | Freq: Four times a day (QID) | ORAL | 0 refills | Status: AC | PRN

## 2022-06-16 MED ORDER — KETOROLAC TROMETHAMINE 30 MG/ML IJ SOLN
30.0000 mg | Freq: Once | INTRAMUSCULAR | Status: AC
  Administered 2022-06-16: 30 mg via INTRAMUSCULAR
  Filled 2022-06-16: qty 1

## 2022-06-16 MED ORDER — KETOROLAC TROMETHAMINE 15 MG/ML IJ SOLN: 15.0000 mg | Freq: Once | INTRAMUSCULAR | Status: DC

## 2022-06-16 MED ORDER — ONDANSETRON HCL 4 MG/2ML IJ SOLN
4.0000 mg | Freq: Once | INTRAMUSCULAR | Status: DC
  Filled 2022-06-16: qty 2

## 2022-06-16 MED ORDER — FREESTYLE LIBRE 2 READER DEVI: 1.0000 | Freq: Every day | 5 refills | Status: DC

## 2022-06-16 MED ORDER — OXYCODONE-ACETAMINOPHEN 5-325 MG PO TABS
1.0000 | ORAL_TABLET | Freq: Once | ORAL | Status: AC
  Administered 2022-06-16: 1 via ORAL
  Filled 2022-06-16: qty 1

## 2022-06-16 MED ORDER — AMLODIPINE BESYLATE 10 MG PO TABS: 10.0000 mg | ORAL_TABLET | Freq: Every day | ORAL | 1 refills | Status: DC

## 2022-06-16 MED ORDER — TAMSULOSIN HCL 0.4 MG PO CAPS: 0.4000 mg | ORAL_CAPSULE | Freq: Every day | ORAL | 0 refills | Status: AC

## 2022-06-16 MED ORDER — FREESTYLE LIBRE 2 SENSOR MISC: 1.0000 | Freq: Every day | 5 refills | Status: DC

## 2022-06-16 NOTE — Discharge Instructions (Addendum)
You have a 7 mm stone in the mid right ureter.  You were prescribed medication to help with your pain, please take 1 tablet 6 hours for severe pain.  You may add additional ibuprofen in between this.  You were also given a prescription for Flomax, this should help with passing of your stone, please take 1 tablet daily for the next 7 days.  Please schedule an appointment with [facility] for further evaluation of your kidney stone or if your symptoms do not improve.

## 2022-06-16 NOTE — ED Notes (Signed)
[facility] called for transport.

## 2022-06-16 NOTE — ED Triage Notes (Signed)
BIBA from home for n/v/d since 1400, Rt side flank pain has hx of kidney stones and DM, not eating/drinking  CBG 196

## 2022-06-16 NOTE — ED Provider Notes (Signed)
  Physical Exam  BP 135/74   Pulse 94   Temp 98.5 F (36.9 C) (Oral)   Resp 13   Ht '5\' 5"'$  (1.651 m)   Wt 99.8 kg   SpO2 97%   BMI 36.61 kg/m   Physical Exam Vitals and nursing note reviewed.  Constitutional:      Appearance: Normal appearance.  HENT:     Head: Normocephalic and atraumatic.     Mouth/Throat:     Mouth: Mucous membranes are dry.  Eyes:     Pupils: Pupils are equal, round, and reactive to light.  Cardiovascular:     Rate and Rhythm: Normal rate.  Pulmonary:     Effort: Pulmonary effort is normal.  Abdominal:     General: Abdomen is flat.  Musculoskeletal:     Cervical back: Normal range of motion and neck supple.  Skin:    General: Skin is warm and dry.  Neurological:     Mental Status: He is alert and oriented to person, place, and time.     Procedures  Procedures  ED Course / MDM   Clinical Course as of 06/16/22 0824  Thu Jun 16, 2022  0703 Plan to follow up on CT scan results and reassess once medications are given for symptoms [GL]  0709 Creatinine: 0.96 [JS]  0747 CT Renal Laren Everts [JS]    Clinical Course User Index [GL] Adolphus Birchwood, PA-C [JS] Janeece Fitting, PA-C   Medical Decision Making Amount and/or Complexity of Data Reviewed Labs: ordered. Decision-making details documented in ED Course. Radiology: ordered. Decision-making details documented in ED Course.  Risk Prescription drug management.  Patient care assumed from Amelia Jo PA-C at shift change, please see her note for full HPI.  Briefly, patient here with hours of right flank pain nausea and vomiting along with 3 episodes of diarrhea.  Similar presentation in the past with a prior history of kidney stones.  Urinalysis here with large amount of hemoglobin noted.  He did not receive medication due to not having IV access, therefore Toradol IM was given, Zofran ODT was also provided for patient.  Plan is for pending CT renal stone.  CT renal study showed: 1. Right-sided  hydronephrosis and proximal right hydroureter  secondary to 7 mm stone within the mid right ureter at the level of  the aortic bifurcation.  2. Bilateral nephrolithiasis.  3. Morphologic features of the liver suggestive of early cirrhosis.  4. Unchanged right adrenal nodule compatible with a benign adenoma.  No follow-up imaging recommended.  5.  Aortic Atherosclerosis (ICD10-I70.0).   7:55 AM patient evaluated by me, he continues to report discomfort as he did not receive pain medication until now.  8:24 AM Spoke to Dr. Tresa Moore of Urology, who recommended outpatient follow up.   9:20 AM] unfortunately patient was unable to obtain an IV, I did discuss with him that his pain is improved after IM Toradol will provide him with Percocet to go home with him pain control.  He has follow-up with [facility] previously and will follow-up with them for further management.  He is agreeable for plan and management, vitals remained stable.  Patient stable for discharge.   Portions of this note were generated with L[employer]. Dictation errors may occur despite best attempts at proofreading.     Janeece Fitting, PA-C 06/16/22 3846    Malvin Johns, MD 06/16/22 1258

## 2022-06-16 NOTE — ED Provider Notes (Signed)
[place] DEPT Provider Note   CSN: [id number] Arrival date & time: 06/16/22  2841     History Medical history includes hypertension, obesity, history of kidney stones, history of prostate cancer, diabetes type 2, BPH, cirrhosis Chief Complaint  Patient presents with  . Emesis    CHIVAS NOTZ is a 71 y.o. male.  Presents to the ED with 4 and half hours of right flank pain with associated nausea, vomiting, and 3 episodes of diarrhea.  She reports similar feeling several years ago when he had a prior kidney stone.  He denies any fevers, chills, hematuria, dysuria, chest pain, shortness of breath.  Emesis Associated symptoms: diarrhea        Home Medications Prior to Admission medications   Medication Sig Start Date End Date Taking? Authorizing Provider  amLODipine (NORVASC) 10 MG tablet Take 1 tablet (10 mg total) by mouth daily. 03/29/22   [name], MD  aspirin EC 81 MG tablet Take 81 mg by mouth daily. Swallow whole.    [provider]  atorvastatin (LIPITOR) 80 MG tablet Take 80 mg by mouth at bedtime. 10/06/21   [provider]  carvedilol (COREG) 6.25 MG tablet TAKE 1 TABLET BY MOUTH TWICE DAILY WITH A MEAL 03/23/22   [name], MD  finasteride (PROSCAR) 5 MG tablet Take 5 mg by mouth daily.    [provider]  Glucagon (GVOKE HYPOPEN 2-PACK) 1 MG/0.2ML SOAJ Inject 1 Act into the skin daily as needed. 06/01/21   [name], MD  levETIRAcetam (KEPPRA) 500 MG tablet Take 500 mg by mouth in the morning and at bedtime. 10/06/21   [provider]  multivitamin-iron-minerals-folic acid (CENTRUM) chewable tablet Chew 1 tablet by mouth daily.    [provider]  omeprazole (PRILOSEC) 40 MG capsule Use Prilosec (omeprazole) 40 mg PO BID for 6 weeks to promote mucosal healing, then reduce to 40 mg daily and continue to titrate to lowest effective dose if abdominal symptoms resolved. Patient  not taking: Reported on 01/26/2022 09/10/20   Cirigliano, Vito V, DO  sertraline (ZOLOFT) 25 MG tablet Take 1 tablet (25 mg total) by mouth daily. 01/29/22 02/28/22  Marissa Calamity, NP  SOLIQUA 100-33 UNT-MCG/ML SOPN INJECT 60 UNITS SUBCUTANEOUSLY ONCE DAILY 05/12/22   [name], MD  tamsulosin (FLOMAX) 0.4 MG CAPS capsule Take 1 capsule (0.4 mg total) by mouth daily. Patient taking differently: Take 0.4 mg by mouth daily as needed. 09/29/20   [name], MD      Allergies    Dilaudid [hydromorphone hcl]    Review of Systems   Review of Systems  Gastrointestinal:  Positive for diarrhea, nausea and vomiting.  Genitourinary:  Positive for flank pain.  All other systems reviewed and are negative.   Physical Exam Updated Vital Signs BP (!) 153/89 (BP Location: Right Arm)   Pulse 82   Temp (!) 97.2 F (36.2 C) (Oral)   Resp 13   Ht '5\' 5"'$  (1.651 m)   Wt 99.8 kg   SpO2 98%   BMI 36.61 kg/m  Physical Exam Vitals and nursing note reviewed.  Constitutional:      General: He is not in acute distress.    Appearance: Normal appearance. He is well-developed. He is not ill-appearing, toxic-appearing or diaphoretic.  HENT:     Head: Normocephalic and atraumatic.     Nose: No nasal deformity.     Mouth/Throat:     Lips: Pink.  No lesions.  Eyes:     General: Gaze aligned appropriately. No scleral icterus.       Right eye: No discharge.        Left eye: No discharge.     Conjunctiva/sclera: Conjunctivae normal.     Right eye: Right conjunctiva is not injected. No exudate or hemorrhage.    Left eye: Left conjunctiva is not injected. No exudate or hemorrhage. Cardiovascular:     Rate and Rhythm: Normal rate and regular rhythm.     Pulses: Normal pulses.  Pulmonary:     Effort: Pulmonary effort is normal. No respiratory distress.  Abdominal:     General: Abdomen is flat. There is no distension.     Palpations: Abdomen is soft. There is no mass.     Tenderness: There is no  abdominal tenderness. There is right CVA tenderness. There is no left CVA tenderness, guarding or rebound.     Hernia: No hernia is present.  Musculoskeletal:     Right lower leg: No edema.     Left lower leg: No edema.  Skin:    General: Skin is warm and dry.  Neurological:     Mental Status: He is alert and oriented to person, place, and time.  Psychiatric:        Mood and Affect: Mood normal.        Speech: Speech normal.        Behavior: Behavior normal. Behavior is cooperative.     ED Results / Procedures / Treatments   Labs (all labs ordered are listed, but only abnormal results are displayed) Labs Reviewed  CBC WITH DIFFERENTIAL/PLATELET - Abnormal; Notable for the following components:      Result Value   WBC 10.6 (*)    Hemoglobin 12.9 (*)    HCT 38.9 (*)    Neutro Abs 9.0 (*)    All other components within normal limits  COMPREHENSIVE METABOLIC PANEL - Abnormal; Notable for the following components:   Glucose, Bld 180 (*)    All other components within normal limits  URINALYSIS, ROUTINE W REFLEX MICROSCOPIC - Abnormal; Notable for the following components:   Color, Urine STRAW (*)    APPearance HAZY (*)    Hgb urine dipstick LARGE (*)    Ketones, ur 5 (*)    RBC / HPF >50 (*)    Bacteria, UA RARE (*)    All other components within normal limits  CBG MONITORING, ED - Abnormal; Notable for the following components:   Glucose-Capillary 195 (*)    All other components within normal limits  LIPASE, BLOOD    EKG None  Radiology No results found.  Procedures Procedures   Medications Ordered in ED Medications  sodium chloride 0.9 % bolus 1,000 mL (has no administration in time range)  morphine (PF) 4 MG/ML injection 4 mg (has no administration in time range)  ondansetron (ZOFRAN) injection 4 mg (has no administration in time range)    ED Course/ Medical Decision Making/ A&P Clinical Course as of 06/16/22 0703  Thu Jun 16, 2022  0703 Plan to follow up  on CT scan results and reassess once medications are given for symptoms [GL]    Clinical Course User Index [GL] Sherre Poot Adora Fridge, PA-C                           Medical Decision Making Amount and/or Complexity of Data Reviewed Labs: ordered. Radiology: ordered.  Risk  Prescription drug management.    MDM  This is a 71 y.o. male who presents to the ED with right flank pain, nausea, vomiting, and diarrhea The differential of this patient includes but is not limited to renal colic, renal infarct, vascular occlusion, pyelonephritis, UTI, appendicitis, gallbladder pathology, gastroenteritis  Initial Impression  Well appearing, no acute distress Vitals are reassuring. Afebrile. + R CVA tenderness.  Plan to order basic labs, lipase, UA, and CT stone study to evaluate for intraabdominal pathology. Will provide IVF, zofran, and morphine for symptoms relief  I personally ordered, reviewed, and interpreted all laboratory work and imaging and agree with radiologist interpretation. Results interpreted below: WBC 10.6, hgb 12.9 (stable), renal function normal, LFTs normal, UA with > 50 RBC  Assessment/Plan:  Labs notable for hematuria which could be consistent with kidney stone. Patient is still pending CT scan as well as pain and nausea medication. He will need reassessment following these measures  6:47 AM Care of [name] transferred to [facility]  at the end of my shift as the patient will require reassessment once labs/imaging have resulted. Patient presentation, ED course, and plan of care discussed with review of all pertinent labs and imaging. Please see his/her note for further details regarding further ED course and disposition. Plan at time of handoff is f/u on CT scan, reassess following medications. This may be altered or completely changed at the discretion of the oncoming team pending results of further workup.    Charting Requirements Additional history is obtained from:   Independent historian External Records from outside source obtained and reviewed including: n/a Social Determinants of Health:  none Pertinant PMH that complicates patient's illness: Cirrhosis, HTN, BPH  Patient Care Problems that were addressed during this visit: - Right flank pain: Acute illness with complication - Nausea, Vomiting, and Diarrhea: Acute illness with systemic symptoms This patient was maintained on a cardiac monitor/telemetry. I personally viewed and interpreted the cardiac monitor which reveals an underlying rhythm of NSR Medications given in ED: Zofran, Morphine, IVF Reevaluation of the patient after these medicines showed that the patient  need reevaluation I have reviewed home medications and made changes accordingly.  Critical Care Interventions: n/a Consultations: n/a Disposition: see oncoming provider note  Portions of this note were generated with Dragon dictation software. Dictation errors may occur despite best attempts at proofreading.         Final Clinical Impression(s) / ED Diagnoses Final diagnoses:  Nausea vomiting and diarrhea  Right flank pain    Rx / DC Orders ED Discharge Orders     None         Adolphus Birchwood, PA-C 68/11/57 2620    Delora Fuel, MD 35/59/74 0730

## 2022-06-20 DIAGNOSIS — N201 Calculus of ureter: Secondary | ICD-10-CM | POA: Diagnosis not present

## 2022-06-20 MED ORDER — BLOOD GLUCOSE METER KIT: PACK | 0 refills | Status: DC

## 2022-06-22 ENCOUNTER — Other Ambulatory Visit: Payer: Self-pay | Admitting: Urology

## 2022-06-24 NOTE — Progress Notes (Addendum)
COVID Vaccine Completed: yes  Date of COVID positive in last 90 days:  PCP - Scarlette Calico, MD Cardiologist -   Chest x-ray -  EKG - 04/12/22 Epic Stress Test -  ECHO - 11/18/20 Epic Cardiac Cath -  Pacemaker/ICD device last checked: Spinal Cord Stimulator:  Bowel Prep -   Sleep Study -  CPAP -   Fasting Blood Sugar -  Checks Blood Sugar _____ times a day  Last dose of GLP1 agonist-  N/A GLP1 instructions:  N/A   Last dose of SGLT-2 inhibitors-  N/A SGLT-2 instructions: N/A   Blood Thinner Instructions: Aspirin Instructions: ASA 81 Last Dose:  Activity level:  Can go up a flight of stairs and perform activities of daily living without stopping and without symptoms of chest pain or shortness of breath.  Able to exercise without symptoms  Unable to go up a flight of stairs without symptoms of     Anesthesia review:   Patient denies shortness of breath, fever, cough and chest pain at PAT appointment  Patient verbalized understanding of instructions that were given to them at the PAT appointment. Patient was also instructed that they will need to review over the PAT instructions again at home before surgery.

## 2022-06-24 NOTE — Patient Instructions (Signed)
SURGICAL WAITING ROOM VISITATION Patients having surgery or a procedure may have no more than 2 support people in the waiting area - these visitors may rotate.   Children under the age of 57 must have an adult with them who is not the patient. If the patient needs to stay at the hospital during part of their recovery, the visitor guidelines for inpatient rooms apply. Pre-op nurse will coordinate an appropriate time for 1 support person to accompany patient in pre-op.  This support person may not rotate.    Please refer to the [facility] website for the visitor guidelines for Inpatients (after your surgery is over and you are in a regular room).     Your procedure is scheduled on: 07/08/22   Report to [facility] Main Entrance    Report to admitting at 8:30 AM   Call this number if you have problems the morning of surgery [phone]   Do not eat food or drink liquids :After Midnight.          If you have questions, please contact your surgeon's office.   FOLLOW BOWEL PREP AND ANY ADDITIONAL PRE OP INSTRUCTIONS YOU RECEIVED FROM YOUR SURGEON'S OFFICE!!!     Oral Hygiene is also important to reduce your risk of infection.                                    Remember - BRUSH YOUR TEETH THE MORNING OF SURGERY WITH YOUR REGULAR TOOTHPASTE  DENTURES WILL BE REMOVED PRIOR TO SURGERY PLEASE DO NOT APPLY "Poly grip" OR ADHESIVES!!!   Do NOT smoke after Midnight   Take these medicines the morning of surgery with A SIP OF WATER: Amlodipine, Carvedilol, Keppra, Metoprolol, Tamsulosin.   DO NOT TAKE ANY ORAL DIABETIC MEDICATIONS DAY OF YOUR SURGERY  How to Manage Your Diabetes Before and After Surgery  Why is it important to control my blood sugar before and after surgery? Improving blood sugar levels before and after surgery helps healing and can limit problems. A way of improving blood sugar control is eating a healthy diet by:  Eating less sugar and carbohydrates  Increasing  activity/exercise  Talking with your doctor about reaching your blood sugar goals High blood sugars (greater than 180 mg/dL) can raise your risk of infections and slow your recovery, so you will need to focus on controlling your diabetes during the weeks before surgery. Make sure that the doctor who takes care of your diabetes knows about your planned surgery including the date and location.  How do I manage my blood sugar before surgery? Check your blood sugar at least 4 times a day, starting 2 days before surgery, to make sure that the level is not too high or low. Check your blood sugar the morning of your surgery when you wake up and every 2 hours until you get to the Short Stay unit. If your blood sugar is less than 70 mg/dL, you will need to treat for low blood sugar: Do not take insulin. Treat a low blood sugar (less than 70 mg/dL) with  cup of clear juice (cranberry or apple), 4 glucose tablets, OR glucose gel. Recheck blood sugar in 15 minutes after treatment (to make sure it is greater than 70 mg/dL). If your blood sugar is not greater than 70 mg/dL on recheck, call [phone] for further instructions. Report your blood sugar to the short stay nurse when  you get to Short Stay.  If you are admitted to the hospital after surgery: Your blood sugar will be checked by the staff and you will probably be given insulin after surgery (instead of oral diabetes medicines) to make sure you have good blood sugar levels. The goal for blood sugar control after surgery is 80-180 mg/dL.  Reviewed and Endorsed by [facility] Patient Education Committee, August 2015                              You may not have any metal on your body including jewelry, and body piercing             Do not wear lotions, powders, cologne, or deodorant              Men may shave face and neck.   Do not bring valuables to the hospital. [place].  DO NOT [place]. PHARMACY WILL DISPENSE MEDICATIONS LISTED ON YOUR MEDICATION LIST TO YOU DURING YOUR ADMISSION [place]!    Patients discharged on the day of surgery will not be allowed to drive home.  Someone NEEDS to stay with you for the first 24 hours after anesthesia.              Please read over the following fact sheets you were given: IF [place] [phone] Schneiders    If you received a COVID test during your pre-op visit  it is requested that you wear a mask when out in public, stay away from anyone that may not be feeling well and notify your surgeon if you develop symptoms. If you test positive for Covid or have been in contact with anyone that has tested positive in the last 10 days please notify you surgeon.    [place] - Preparing for Surgery Before surgery, you can play an important role.  Because skin is not sterile, your skin needs to be as free of germs as possible.  You can reduce the number of germs on your skin by washing with CHG (chlorahexidine gluconate) soap before surgery.  CHG is an antiseptic cleaner which kills germs and bonds with the skin to continue killing germs even after washing. Please DO NOT use if you have an allergy to CHG or antibacterial soaps.  If your skin becomes reddened/irritated stop using the CHG and inform your nurse when you arrive at Short Stay. Do not shave (including legs and underarms) for at least 48 hours prior to the first CHG shower.  You may shave your face/neck.  Please follow these instructions carefully:  1.  Shower with CHG Soap the night before surgery and the  morning of surgery.  2.  If you choose to wash your hair, wash your hair first as usual with your normal  shampoo.  3.  After you shampoo, rinse your hair and body thoroughly to remove the shampoo.                             4.  Use CHG as you would any other liquid soap.  You can apply chg  directly to the skin and wash.  Gently with a scrungie or clean washcloth.  5.  Apply the CHG Soap to your body ONLY FROM THE NECK DOWN.   Do   not use on face/ open                           Wound or open sores. Avoid contact with eyes, ears mouth and   genitals (private parts).                       Wash face,  Genitals (private parts) with your normal soap.             6.  Wash thoroughly, paying special attention to the area where your    surgery  will be performed.  7.  Thoroughly rinse your body with warm water from the neck down.  8.  DO NOT shower/wash with your normal soap after using and rinsing off the CHG Soap.                9.  Pat yourself dry with a clean towel.            10.  Wear clean pajamas.            11.  Place clean sheets on your bed the night of your first shower and do not  sleep with pets. Day of Surgery : Do not apply any lotions/deodorants the morning of surgery.  Please wear clean clothes to the hospital/surgery center.  FAILURE TO FOLLOW THESE INSTRUCTIONS MAY RESULT IN THE CANCELLATION OF YOUR SURGERY  PATIENT SIGNATURE_________________________________  NURSE SIGNATURE__________________________________  ________________________________________________________________________

## 2022-06-27 ENCOUNTER — Encounter (HOSPITAL_COMMUNITY)
Admission: RE | Admit: 2022-06-27 | Discharge: 2022-06-27 | Disposition: A | Discharge status: Home / Self Care | Payer: Medicare Other | Source: Ambulatory Visit | Attending: Urology | Admitting: Urology

## 2022-06-27 ENCOUNTER — Encounter (HOSPITAL_COMMUNITY): Payer: Self-pay

## 2022-06-27 VITALS — BP 137/76 | HR 65 | Temp 98.6°F | Resp 16 | Ht 65.0 in | Wt 219.6 lb

## 2022-06-27 DIAGNOSIS — I10 Essential (primary) hypertension: Secondary | ICD-10-CM

## 2022-06-27 DIAGNOSIS — K7469 Other cirrhosis of liver: Secondary | ICD-10-CM | POA: Diagnosis not present

## 2022-06-27 DIAGNOSIS — Z01812 Encounter for preprocedural laboratory examination: Secondary | ICD-10-CM | POA: Insufficient documentation

## 2022-06-27 DIAGNOSIS — E119 Type 2 diabetes mellitus without complications: Secondary | ICD-10-CM

## 2022-06-27 HISTORY — DX: Cerebral infarction, unspecified: I63.9

## 2022-06-27 LAB — CBC
HCT: 34.7 % — ABNORMAL LOW (ref 39.0–52.0)
Hemoglobin: 11.1 g/dL — ABNORMAL LOW (ref 13.0–17.0)
MCH: 29.8 pg (ref 26.0–34.0)
MCHC: 32 g/dL (ref 30.0–36.0)
MCV: 93.3 fL (ref 80.0–100.0)
Platelets: 219 K/uL (ref 150–400)
RBC: 3.72 MIL/uL — ABNORMAL LOW (ref 4.22–5.81)
RDW: 14.3 % (ref 11.5–15.5)
WBC: 5 K/uL (ref 4.0–10.5)
nRBC: 0 % (ref 0.0–0.2)

## 2022-06-27 LAB — COMPREHENSIVE METABOLIC PANEL WITH GFR
ALT: 15 U/L (ref 0–44)
AST: 18 U/L (ref 15–41)
Albumin: 3.4 g/dL — ABNORMAL LOW (ref 3.5–5.0)
Alkaline Phosphatase: 76 U/L (ref 38–126)
Anion gap: 6 (ref 5–15)
BUN: 9 mg/dL (ref 8–23)
CO2: 29 mmol/L (ref 22–32)
Calcium: 9 mg/dL (ref 8.9–10.3)
Chloride: 106 mmol/L (ref 98–111)
Creatinine, Ser: 0.76 mg/dL (ref 0.61–1.24)
GFR, Estimated: >60 mL/min
Glucose, Bld: 171 mg/dL — ABNORMAL HIGH (ref 70–99)
Potassium: 4.2 mmol/L (ref 3.5–5.1)
Sodium: 141 mmol/L (ref 135–145)
Total Bilirubin: 0.5 mg/dL (ref 0.3–1.2)
Total Protein: 7 g/dL (ref 6.5–8.1)

## 2022-06-27 LAB — GLUCOSE, CAPILLARY: Glucose-Capillary: 154 mg/dL — ABNORMAL HIGH (ref 70–99)

## 2022-06-29 LAB — HEMOGLOBIN A1C
Hgb A1c MFr Bld: 6.5 % — ABNORMAL HIGH (ref 4.8–5.6)
Mean Plasma Glucose: 140 mg/dL

## 2022-07-01 ENCOUNTER — Telehealth: Payer: Self-pay | Admitting: Internal Medicine

## 2022-07-01 NOTE — Telephone Encounter (Signed)
Form has been received and placed on PCPs desk for review.

## 2022-07-01 NOTE — Telephone Encounter (Signed)
For our records:  We have received surgical clearance for the pt from [facility] and it has been placed in dr. Ronnald Ramp boxes.   Upon completion please fax to:  [phone]

## 2022-07-04 ENCOUNTER — Encounter: Payer: Self-pay | Admitting: Internal Medicine

## 2022-07-04 NOTE — Telephone Encounter (Signed)
Form signed and faxed back with letter from PCP.

## 2022-07-05 ENCOUNTER — Other Ambulatory Visit: Payer: Self-pay | Admitting: Internal Medicine

## 2022-07-05 DIAGNOSIS — E119 Type 2 diabetes mellitus without complications: Secondary | ICD-10-CM

## 2022-07-06 NOTE — H&P (Signed)
Bradley Hull presents today as an add-on to my schedule. He is known to our clinic for history of prostate cancer and voiding issues. However he went to the emergency department November 23 complaining of right flank pain and was found to have a 7 mm mid right ureteral stone with associated hydronephrosis. The stone to skin distance is around 18 to 19 cm   Labs in the ED were unremarkable. His creatinine was 0.96 white count 10.6. Urinalysis not suspicious for infection.   In clinic today he is doing okay. He does endorse some right flank pain. He denies fevers at home, subjective fever symptoms, nausea, vomiting.     ALLERGIES: Hydromorphone - Itching (Mild)    MEDICATIONS: Percocet 5 mg-325 mg tablet 1-2 tablet PO Q6PRN Pain from kidney stone  Tamsulosin Hcl 0.4 mg capsule 1 capsule PO Daily PRN  Amlodipine Besilate  Atorvastatin Calcium 80 mg tablet 1 tablet PO Daily  Carvedilol 6.25 mg tablet  Levetiracetam 500 mg tablet 1 tablet PO Daily  Ondansetron Odt 8 mg tablet,disintegrating 1 tab Q8 PRN nausea from kidney stone  Soliqua 100-33 100 unit-33 mcg/ml (3 ml) insulin pen     GU PSH: Cystoscopy Ureteroscopy - 2017 ESWL - 2017 Locm 300-'399Mg'$ /Ml Iodine,1Ml - 2022 Prostate Needle Biopsy - 2022 Transperineal Plmt Biodegradable Matrl 1/Mlt Njx - 2022       PSH Notes: Cystoscopy With Ureteroscopy Left, Renal Lithotripsy, Knee Surgery, Pilonidal Cyst Resection   NON-GU PSH: Remove Pilonidal Lesion - 2017 Surgical Pathology, Gross And Microscopic Examination For Prostate Needle - 2022     GU PMH: History of prostate cancer, His PSA continues to fall. I will have him stop dutasteride. PSA in 6 months. - 04/20/2022, - 03/03/2022, His PSA was falling nicely in September and will be repeated today and at f/u in 6 months. , - 10/08/2021 Prostate nodule w/ LUTS, He has persistent LUTS with some increased incontinence but he is emptying well. He will continue the tamsulosin but I will add Gemtesa  . Side effects reviewed. He will return in 25monthwith a PVR. - 04/20/2022, He has been able to cut the tamsulosin down to 1 a day. I have refilled the tamsulosin and finasteride. I will consider stopping the finasteride at his next visit if the PSA continues to fall. , - 10/08/2021, His LUTS have worsened but it looks like he has a recurrent UTI. I will get a culture and start bactrim pending the culture and will consider suppressive therapy. , - 04/05/2021, His LUTS remain Moderate but his IPSS is down some. I am going to increase the tamsulosin to 0.'8mg'$  daily since he is interested in seeds and will be at higher risk for post op LUTS. , - 2022, - 2022, - 2022, He has moderate to severe LUTS and has a known large prostate with prior retention. He will stay on tamsulosin. , - 2021 Urge incontinence - 04/20/2022 Urinary Frequency - 04/20/2022, - 03/03/2022, - 10/08/2021, - 05/03/2021, - 04/05/2021, - 01/12/2021, - 12/28/2020, - 2022 Nocturia - 03/03/2022, - 10/08/2021, - 05/03/2021, - 04/05/2021 (Stable), - 2022, - 2021 Weak Urinary Stream - 03/03/2022, - 05/03/2021, - 04/05/2021, - 2022 (Stable), - 2022, - 2021 ED due to arterial insufficiency - 10/08/2021, We discussed oral meds and will give him a trial of tadalafil. He will try 2-4 tabs po prn. I reviewed the side effects in detail and also the negative impact that radiation might have on his function. , - 2022 Microscopic hematuria, He  has persistent microhematuria with a history of renal stones on CT. - 10/08/2021, He has some renal stones but no other concerning cause was found. , - 2022, The CT showed small lower pole renal stones and cyst. The bladder was unremarkable. Since he is interested in a seed implant and that has an associated cystoscopy, I will just defer cystoscopy until then. , - 2022, - 2022, - 2022 Renal calculus - 10/08/2021, He has bilateral lower pole stones, left > right, and will need that monitored over time. , - 2022 Acute Cystitis/UTI -  05/03/2021, - 04/05/2021 Prostate Cancer, PSA is falling nicely. Repeat in 3 months. - 04/05/2021, - 03/02/2021, - 02/16/2021, He just started radiation therapy. , - 2022, He has T2a N0 Mx Gleason 7(3+4) favorable intermediate risk prostate cancer with moderate LUTS on tamsulosin and mild ED. I discussed options including AS, RP, EXRT, Seeds, Cryo and HIFU and he is interested in a Seed implant so I will set him up with Dr. Tammi Klippel. , - 2022 Urinary Retention - 03/02/2021, - 02/22/2021, - 02/16/2021, - 02/01/2021, - 01/12/2021, - 01/11/2021, - 2019 Straining on Urination - 01/12/2021, - 12/28/2020, - 2022 Elevated PSA - 2022, His PSA is up to 3.71 but it was 3.6 in 2019. He has a subtle soft nodule at the left apex. Since he is having knee replacements tomorrow, I will just have him return in 4 months with a repeat PSA, but with the nodule he will probably need a biopsy. , - 2021 Ureteral calculus, Calculus of right ureter - 2017 History of urolithiasis, History of kidney stones - 2017    NON-GU PMH: Cutaneous abscess of other sites - 02/22/2021 Encounter for general adult medical examination without abnormal findings, Encounter for preventive health examination - 2017 Personal history of other diseases of the circulatory system, History of hypertension - 2017 Personal history of other diseases of the digestive system, History of cirrhosis - 2017 Personal history of other diseases of the musculoskeletal system and connective tissue, History of arthritis - 2017 Personal history of other endocrine, nutritional and metabolic disease, History of diabetes mellitus - 2017 Personal history of other infectious and parasitic diseases, History of hepatitis C virus infection - 2017    FAMILY HISTORY: malignant neoplasm of kidney - Runs In Family malignant neoplasm of prostate - Runs In Family renal failure - Runs In Family   SOCIAL HISTORY: Marital Status: Single Preferred Language: English; Ethnicity: Not Hispanic Or  Latino; Race: Black or African American Current Smoking Status: Patient smokes.   Tobacco Use Assessment Completed: Used Tobacco in last 30 days? Does drink.  Does not drink caffeine.     Notes: Alcohol use, Single, Number of children, Current smoker, Caffeine use   REVIEW OF SYSTEMS:    GU Review Male:   Patient denies frequent urination, hard to postpone urination, burning/ pain with urination, get up at night to urinate, leakage of urine, stream starts and stops, trouble starting your stream, have to strain to urinate , erection problems, and penile pain.  Gastrointestinal (Upper):   Patient denies nausea, vomiting, and indigestion/ heartburn.  Gastrointestinal (Lower):   Patient denies diarrhea and constipation.  Constitutional:   Patient denies fever, night sweats, weight loss, and fatigue.  Skin:   Patient denies skin rash/ lesion and itching.  Eyes:   Patient denies blurred vision and double vision.  Ears/ Nose/ Throat:   Patient denies sore throat and sinus problems.  Hematologic/Lymphatic:   Patient denies swollen glands and easy  bruising.  Cardiovascular:   Patient denies leg swelling and chest pains.  Respiratory:   Patient denies cough and shortness of breath.  Endocrine:   Patient denies excessive thirst.  Musculoskeletal:   Patient denies back pain and joint pain.  Neurological:   Patient denies headaches and dizziness.  Psychologic:   Patient denies depression and anxiety.   VITAL SIGNS:      06/20/2022 10:23 AM  BP 119/72 mmHg  Pulse 66 /min  Temperature 97.1 F / 36.1 C   MULTI-SYSTEM PHYSICAL EXAMINATION:    Constitutional: Well-nourished. No physical deformities. Normally developed. Good grooming.  Respiratory: No labored breathing, no use of accessory muscles.   Cardiovascular: Normal temperature, normal extremity pulses, no swelling, no varicosities.  Neurologic / Psychiatric: Oriented to time, oriented to place, oriented to person. No depression, no anxiety, no  agitation.  Eyes: Normal conjunctivae. Normal eyelids.  Musculoskeletal: Normal gait and station of head and neck.     Complexity of Data:   04/12/22 10/08/21 03/31/21 07/08/20 12/24/19 07/19/18 02/28/18 10/09/17  PSA  Total PSA 0.094 ng/mL 0.13 ng/mL 0.29 ng/mL 2.63 ng/mL 3.71 ng/dl 1.67 ng/dl 3.60 ng/dl 1.66 ng/dl    PROCEDURES:         KUB - 74018  A single view of the abdomen is obtained. Calcification seen within the renal shadows bilaterally left more so than right. There is an opacity around the level of L4 on the right which could be consistent with the stone      . Patient confirmed No Neulasta OnPro Device.           Urinalysis w/Scope Dipstick Dipstick Cont'd Micro  Color: Yellow Bilirubin: Neg mg/dL WBC/hpf: 0 - 5/hpf  Appearance: Clear Ketones: Neg mg/dL RBC/hpf: 3 - 10/hpf  Specific Gravity: 1.020 Blood: 3+ ery/uL Bacteria: Rare (0-9/hpf)  pH: 6.5 Protein: Trace mg/dL Cystals: NS (Not Seen)  Glucose: Neg mg/dL Urobilinogen: 0.2 mg/dL Casts: NS (Not Seen)    Nitrites: Neg Trichomonas: Not Present    Leukocyte Esterase: Trace leu/uL Mucous: Present      Epithelial Cells: 0 - 5/hpf      Yeast: NS (Not Seen)      Sperm: Not Present    Notes: specimen read unspun due to QNS    ASSESSMENT:      ICD-10 Details  1 GU:   Ureteral calculus - N20.1 Acute, Systemic Symptoms  2   Ureteral obstruction secondary to calculous - N13.2 Acute, Systemic Symptoms   PLAN:            Medications New Meds: Celebrex 200 mg capsule 1 capsule PO BID   #28  1 Refill(s)  Oxycodone Hcl 5 mg tablet 1 tablet PO Q 6 H PRN   #16  0 Refill(s)  Pharmacy Name:  [id number] Address:  [address] [place] [address] Phone:  [phone] Fax:  [phone]   Stop Meds: Crestor  Discontinue: 06/20/2022  - Reason: The medication cycle was completed.  Prilosec  Discontinue: 06/20/2022  - Reason: The medication cycle was completed.   Hydrocodone-Acetaminophen 5 mg-325 mg tablet 1 tablet PO Q 6 H PRN  Start: 02/03/2021  Discontinue: 06/20/2022  - Reason: The medication cycle was completed.  Tadalafil 5 mg tablet 1 tablet PO PRN  Start: 10/08/2021  Discontinue: 06/20/2022  - Reason: The medication cycle was completed.            Orders X-Rays: KUB  Schedule         Document Letter(s):  Created for Patient: Clinical Summary         Notes:   I had a long discussion with Mr. Kirker. I believe the stone is visible on KUB but his stone to skin distance makes the success of this modality less likely. Additionally, given the size of the stone is less likely to pass on its own. Subsequently we discussed ureteroscopy with laser lithotripsy in detail. Risks include bleeding, infection, sepsis, damage to urinary tract, need for future procedures, persistent stone fragments, stent discomfort, prolonged stent duration. Mr. Seal is interested in proceeding. I will coordinate with Dr. Jeffie Pollock. In the meantime I advised him to take over-the-counter Tylenol and will send prescriptions for Celebrex and oxycodone. We also discussed warning signs and symptoms for which he should return to the ED or our office and he voiced understanding.

## 2022-07-07 ENCOUNTER — Telehealth: Payer: Self-pay | Admitting: Internal Medicine

## 2022-07-07 NOTE — Telephone Encounter (Signed)
Left message for patient to call back to schedule Medicare Annual Wellness Visit   No hx of AWV eligible as of 01/23/19  Please schedule at anytime with [facility] Advisor if patient calls the office back.     Any questions, please call me at [phone]

## 2022-07-07 NOTE — Progress Notes (Signed)
Anesthesia Chart Review   Case: [id number] Date/Time: 07/08/22 1007   Procedure: CYSTOSCOPY RIGHT RETROGRADE PYELOGRAM /URETEROSCOPY/HOLMIUM LASER/STENT PLACEMENT (Right) - 1 HR   Anesthesia type: General   Pre-op diagnosis: RIGHT MID URETERAL STONE   Location: [facility] / [facility]   Surgeons: Irine Seal, MD       DISCUSSION:71 y.o. smoker with h/o HTN, DM II, BPH, right mid ureteral stone scheduled for above procedure 07/08/2022 with Dr. Irine Seal.   PCP clearance received which states, "It is my medical opinion that Bradley Hull  has a MICA score of 0.2% . He does not take blood thinners. It is safe to proceed with ureteroscopy and stent placement."  Anticipate pt can proceed with planned procedure barring acute status change.   VS: BP 137/76   Pulse 65   Temp 37 C (Oral)   Resp 16   Ht _0  (1.651 m)   Wt 99.6 kg   SpO2 95%   BMI 36.54 kg/m   PROVIDERS: [name], MD is PCP    LABS: Labs reviewed: Acceptable for surgery. (all labs ordered are listed, but only abnormal results are displayed)  Labs Reviewed  HEMOGLOBIN A1C - Abnormal; Notable for the following components:      Result Value   Hgb A1c MFr Bld 6.5 (*)    All other components within normal limits  COMPREHENSIVE METABOLIC PANEL - Abnormal; Notable for the following components:   Glucose, Bld 171 (*)    Albumin 3.4 (*)    All other components within normal limits  CBC - Abnormal; Notable for the following components:   RBC 3.72 (*)    Hemoglobin 11.1 (*)    HCT 34.7 (*)    All other components within normal limits  GLUCOSE, CAPILLARY - Abnormal; Notable for the following components:   Glucose-Capillary 154 (*)    All other components within normal limits     IMAGES:   EKG:   CV: Echocardiogram 11/18/2020:  Left ventricle cavity is normal in size. Mild concentric hypertrophy of  the left ventricle. Normal global wall motion. Normal LV systolic function  with EF 68%. Normal  diastolic filling pattern.  Mild tricuspid regurgitation.  No evidence of pulmonary hypertension.  Past Medical History:  Diagnosis Date   Arthritis    Benign prostatic hyperplasia    Cirrhosis ([place])    Colon polyps    Complication of anesthesia    bp droppped and heart rate sped up after 09-09-2020 endoscopy /colonscopy   DM type 2 (diabetes mellitus, type 2) ([place])    Hepatitis C    took treatment for 2018 issue resolved per pt   History of kidney stones    Hypertension    Nicotine addiction    Obesity    Osteoarthritis of both knees    Prostate cancer ([place])    Renal lithiasis    Stroke [facility])     Past Surgical History:  Procedure Laterality Date   COLONOSCOPY  09/09/2020   [facility]   CYSTOSCOPY  11/17/2020   Procedure: CYSTOSCOPY FLEXIBLE;  Surgeon: Irine Seal, MD;  Location: [facility];  Service: Urology;;   ESOPHAGOGASTRODUODENOSCOPY  2016   KIDNEY STONE SURGERY Right yrs ago   LITHOTRIPSY Left 1986   MULTIPLE EXTRACTIONS WITH ALVEOLOPLASTY N/A 09/09/2016   Procedure: MULTIPLE EXTRACTION WITH ALVEOLOPLASTY - one, two, six, seven, nine, ten, eleven, sixteen, 32, twenty, twenty-one, twenty-two, twenty-four, twenty-five, twenty-six, twenty-seven, twenty-eight, twenty-nine, thirty-one, thirty-two; alveolplasty; removal bilateral mandible turi;  removal cyst right mandible;  bone graft;  Surgeon: [name], DDS;  Location: [place];  Service: Oral S   PILONIDAL CYST EXCISION  yrs ago   from spine   SPACE OAR INSTILLATION N/A 11/17/2020   Procedure: SPACE OAR INSTILLATION FIDUCIAL MARKER;  Surgeon: Irine Seal, MD;  Location: [facility];  Service: Urology;  Laterality: N/A;  prostate   TOTAL KNEE ARTHROPLASTY Bilateral 03/2020   UPPER GASTROINTESTINAL ENDOSCOPY  08/30/2020   [facility]    WISDOM TOOTH EXTRACTION     x4 removed    MEDICATIONS:  amLODipine (NORVASC) 10 MG tablet   aspirin EC 81 MG tablet   atorvastatin (LIPITOR) 80 MG tablet    blood glucose meter kit and supplies   carvedilol (COREG) 6.25 MG tablet   celecoxib (CELEBREX) 200 MG capsule   Continuous Blood Gluc Receiver (FREESTYLE LIBRE 2 READER) DEVI   Continuous Blood Gluc Sensor (FREESTYLE LIBRE 2 SENSOR) MISC   diclofenac Sodium (VOLTAREN) 1 % GEL   Glucagon (GVOKE HYPOPEN 2-PACK) 1 MG/0.2ML SOAJ   levETIRAcetam (KEPPRA) 500 MG tablet   metoprolol tartrate (LOPRESSOR) 25 MG tablet   omeprazole (PRILOSEC) 40 MG capsule   ondansetron (ZOFRAN-ODT) 8 MG disintegrating tablet   SOLIQUA 100-33 UNT-MCG/ML SOPN   No current facility-administered medications for this encounter.    Konrad Felix Ward, PA-C [facility] [phone]

## 2022-07-08 ENCOUNTER — Encounter (HOSPITAL_COMMUNITY): Payer: Self-pay | Admitting: Urology

## 2022-07-08 ENCOUNTER — Ambulatory Visit (HOSPITAL_COMMUNITY)
Admission: RE | Admit: 2022-07-08 | Discharge: 2022-07-08 | Disposition: A | Discharge status: Home / Self Care | Payer: Medicare Other | Attending: Urology | Admitting: Urology

## 2022-07-08 ENCOUNTER — Encounter (HOSPITAL_COMMUNITY)
Admission: RE | Disposition: A | Discharge status: Home / Self Care | Payer: Self-pay | Source: Home / Self Care | Attending: Urology

## 2022-07-08 ENCOUNTER — Other Ambulatory Visit: Payer: Self-pay

## 2022-07-08 ENCOUNTER — Ambulatory Visit (HOSPITAL_COMMUNITY): Payer: Medicare Other

## 2022-07-08 ENCOUNTER — Ambulatory Visit (HOSPITAL_COMMUNITY): Payer: Medicare Other | Admitting: Physician Assistant

## 2022-07-08 ENCOUNTER — Ambulatory Visit (HOSPITAL_BASED_OUTPATIENT_CLINIC_OR_DEPARTMENT_OTHER): Payer: Medicare Other | Admitting: Certified Registered Nurse Anesthetist

## 2022-07-08 DIAGNOSIS — K7469 Other cirrhosis of liver: Secondary | ICD-10-CM

## 2022-07-08 DIAGNOSIS — K219 Gastro-esophageal reflux disease without esophagitis: Secondary | ICD-10-CM | POA: Insufficient documentation

## 2022-07-08 DIAGNOSIS — M1990 Unspecified osteoarthritis, unspecified site: Secondary | ICD-10-CM | POA: Insufficient documentation

## 2022-07-08 DIAGNOSIS — K7460 Unspecified cirrhosis of liver: Secondary | ICD-10-CM | POA: Insufficient documentation

## 2022-07-08 DIAGNOSIS — Z87442 Personal history of urinary calculi: Secondary | ICD-10-CM | POA: Insufficient documentation

## 2022-07-08 DIAGNOSIS — F17200 Nicotine dependence, unspecified, uncomplicated: Secondary | ICD-10-CM | POA: Insufficient documentation

## 2022-07-08 DIAGNOSIS — E119 Type 2 diabetes mellitus without complications: Secondary | ICD-10-CM

## 2022-07-08 DIAGNOSIS — N201 Calculus of ureter: Secondary | ICD-10-CM | POA: Diagnosis not present

## 2022-07-08 DIAGNOSIS — F17210 Nicotine dependence, cigarettes, uncomplicated: Secondary | ICD-10-CM | POA: Diagnosis not present

## 2022-07-08 DIAGNOSIS — I10 Essential (primary) hypertension: Secondary | ICD-10-CM | POA: Diagnosis not present

## 2022-07-08 DIAGNOSIS — N132 Hydronephrosis with renal and ureteral calculous obstruction: Secondary | ICD-10-CM | POA: Insufficient documentation

## 2022-07-08 HISTORY — PX: CYSTOSCOPY/URETEROSCOPY/HOLMIUM LASER/STENT PLACEMENT: SHX6546

## 2022-07-08 LAB — GLUCOSE, CAPILLARY
Glucose-Capillary: 154 mg/dL — ABNORMAL HIGH (ref 70–99)
Glucose-Capillary: 154 mg/dL — ABNORMAL HIGH (ref 70–99)

## 2022-07-08 SURGERY — CYSTOSCOPY/URETEROSCOPY/HOLMIUM LASER/STENT PLACEMENT
Anesthesia: General | Laterality: Right

## 2022-07-08 MED ORDER — OXYCODONE HCL 5 MG/5ML PO SOLN: 5.0000 mg | Freq: Once | ORAL | Status: AC | PRN

## 2022-07-08 MED ORDER — PHENYLEPHRINE 80 MCG/ML (10ML) SYRINGE FOR IV PUSH (FOR BLOOD PRESSURE SUPPORT)
PREFILLED_SYRINGE | INTRAVENOUS | Status: AC
  Filled 2022-07-08: qty 10

## 2022-07-08 MED ORDER — ACETAMINOPHEN 325 MG PO TABS: 325.0000 mg | ORAL_TABLET | ORAL | Status: DC | PRN

## 2022-07-08 MED ORDER — FENTANYL CITRATE (PF) 100 MCG/2ML IJ SOLN
INTRAMUSCULAR | Status: DC | PRN
  Administered 2022-07-08 (×2): 50 ug via INTRAVENOUS

## 2022-07-08 MED ORDER — FENTANYL CITRATE PF 50 MCG/ML IJ SOSY
PREFILLED_SYRINGE | INTRAMUSCULAR | Status: AC
  Administered 2022-07-08: 50 ug via INTRAVENOUS
  Filled 2022-07-08: qty 2

## 2022-07-08 MED ORDER — AMISULPRIDE (ANTIEMETIC) 5 MG/2ML IV SOLN: 10.0000 mg | Freq: Once | INTRAVENOUS | Status: DC | PRN

## 2022-07-08 MED ORDER — FENTANYL CITRATE (PF) 100 MCG/2ML IJ SOLN
INTRAMUSCULAR | Status: AC
  Filled 2022-07-08: qty 2

## 2022-07-08 MED ORDER — DEXAMETHASONE SODIUM PHOSPHATE 10 MG/ML IJ SOLN
INTRAMUSCULAR | Status: AC
  Filled 2022-07-08: qty 1

## 2022-07-08 MED ORDER — PROMETHAZINE HCL 25 MG/ML IJ SOLN: 6.2500 mg | INTRAMUSCULAR | Status: DC | PRN

## 2022-07-08 MED ORDER — PROPOFOL 10 MG/ML IV BOLUS
INTRAVENOUS | Status: AC
  Filled 2022-07-08: qty 20

## 2022-07-08 MED ORDER — OXYCODONE HCL 5 MG PO TABS
ORAL_TABLET | ORAL | Status: AC
  Filled 2022-07-08: qty 1

## 2022-07-08 MED ORDER — GLYCOPYRROLATE 0.2 MG/ML IJ SOLN
INTRAMUSCULAR | Status: DC | PRN
  Administered 2022-07-08: .2 mg via INTRAVENOUS

## 2022-07-08 MED ORDER — EPHEDRINE 5 MG/ML INJ
INTRAVENOUS | Status: AC
  Filled 2022-07-08: qty 5

## 2022-07-08 MED ORDER — SODIUM CHLORIDE 0.9% FLUSH: 3.0000 mL | Freq: Two times a day (BID) | INTRAVENOUS | Status: DC

## 2022-07-08 MED ORDER — 0.9 % SODIUM CHLORIDE (POUR BTL) OPTIME
TOPICAL | Status: DC | PRN
  Administered 2022-07-08: 1000 mL

## 2022-07-08 MED ORDER — IOHEXOL 300 MG/ML  SOLN
INTRAMUSCULAR | Status: DC | PRN
  Administered 2022-07-08: 5 mL

## 2022-07-08 MED ORDER — ATROPINE SULFATE 1 MG/10ML IJ SOSY
PREFILLED_SYRINGE | INTRAMUSCULAR | Status: AC
  Filled 2022-07-08: qty 10

## 2022-07-08 MED ORDER — ATROPINE SULFATE 1 MG/ML IV SOLN
INTRAVENOUS | Status: DC | PRN
  Administered 2022-07-08: .5 mg via INTRAVENOUS

## 2022-07-08 MED ORDER — OXYCODONE HCL 5 MG PO TABS
5.0000 mg | ORAL_TABLET | Freq: Once | ORAL | Status: AC | PRN
  Administered 2022-07-08: 5 mg via ORAL

## 2022-07-08 MED ORDER — PHENYLEPHRINE HCL (PRESSORS) 10 MG/ML IV SOLN
INTRAVENOUS | Status: DC | PRN
  Administered 2022-07-08 (×5): 80 ug via INTRAVENOUS

## 2022-07-08 MED ORDER — SODIUM CHLORIDE 0.9 % IR SOLN
Status: DC | PRN
  Administered 2022-07-08: 3000 mL

## 2022-07-08 MED ORDER — PROPOFOL 10 MG/ML IV BOLUS
INTRAVENOUS | Status: DC | PRN
  Administered 2022-07-08: 150 mg via INTRAVENOUS
  Administered 2022-07-08: 50 mg via INTRAVENOUS

## 2022-07-08 MED ORDER — LIDOCAINE HCL (PF) 2 % IJ SOLN
INTRAMUSCULAR | Status: AC
  Filled 2022-07-08: qty 5

## 2022-07-08 MED ORDER — ORAL CARE MOUTH RINSE: 15.0000 mL | Freq: Once | OROMUCOSAL | Status: AC

## 2022-07-08 MED ORDER — CHLORHEXIDINE GLUCONATE 0.12 % MT SOLN
15.0000 mL | Freq: Once | OROMUCOSAL | Status: AC
  Administered 2022-07-08: 15 mL via OROMUCOSAL

## 2022-07-08 MED ORDER — EPHEDRINE SULFATE (PRESSORS) 50 MG/ML IJ SOLN
INTRAMUSCULAR | Status: DC | PRN
  Administered 2022-07-08 (×4): 10 mg via INTRAVENOUS

## 2022-07-08 MED ORDER — CEFAZOLIN SODIUM-DEXTROSE 2-4 GM/100ML-% IV SOLN
2.0000 g | INTRAVENOUS | Status: AC
  Administered 2022-07-08: 2 g via INTRAVENOUS
  Filled 2022-07-08: qty 100

## 2022-07-08 MED ORDER — FENTANYL CITRATE PF 50 MCG/ML IJ SOSY
25.0000 ug | PREFILLED_SYRINGE | INTRAMUSCULAR | Status: DC | PRN
  Administered 2022-07-08 (×2): 50 ug via INTRAVENOUS

## 2022-07-08 MED ORDER — ONDANSETRON HCL 4 MG/2ML IJ SOLN
INTRAMUSCULAR | Status: DC | PRN
  Administered 2022-07-08: 4 mg via INTRAVENOUS

## 2022-07-08 MED ORDER — LACTATED RINGERS IV SOLN: INTRAVENOUS | Status: DC

## 2022-07-08 MED ORDER — FENTANYL CITRATE PF 50 MCG/ML IJ SOSY
PREFILLED_SYRINGE | INTRAMUSCULAR | Status: AC
  Filled 2022-07-08: qty 1

## 2022-07-08 MED ORDER — ACETAMINOPHEN 10 MG/ML IV SOLN: 1000.0000 mg | Freq: Once | INTRAVENOUS | Status: DC | PRN

## 2022-07-08 MED ORDER — LIDOCAINE 2% (20 MG/ML) 5 ML SYRINGE
INTRAMUSCULAR | Status: DC | PRN
  Administered 2022-07-08: 60 mg via INTRAVENOUS

## 2022-07-08 MED ORDER — ACETAMINOPHEN 160 MG/5ML PO SOLN: 325.0000 mg | ORAL | Status: DC | PRN

## 2022-07-08 MED ORDER — ONDANSETRON HCL 4 MG/2ML IJ SOLN
INTRAMUSCULAR | Status: AC
  Filled 2022-07-08: qty 2

## 2022-07-08 MED ORDER — DEXAMETHASONE SODIUM PHOSPHATE 4 MG/ML IJ SOLN
INTRAMUSCULAR | Status: DC | PRN
  Administered 2022-07-08: 5 mg via INTRAVENOUS

## 2022-07-08 SURGICAL SUPPLY — 23 items

## 2022-07-08 NOTE — Interval H&P Note (Signed)
History and Physical Interval Note:  He hasn't passed his stone.     07/08/2022 9:39 AM  [name]  has presented today for surgery, with the diagnosis of RIGHT MID URETERAL STONE.  The various methods of treatment have been discussed with the patient and family. After consideration of risks, benefits and other options for treatment, the patient has consented to  Procedure(s) with comments: CYSTOSCOPY RIGHT RETROGRADE PYELOGRAM /URETEROSCOPY/HOLMIUM LASER/STENT PLACEMENT (Right) - 1 HR as a surgical intervention.  The patient's history has been reviewed, patient examined, no change in status, stable for surgery.  I have reviewed the patient's chart and labs.  Questions were answered to the patient's satisfaction.     Irine Seal

## 2022-07-08 NOTE — Anesthesia Postprocedure Evaluation (Signed)
Anesthesia Post Note  Patient: SHAUNN TACKITT  Procedure(s) Performed: CYSTOSCOPY RIGHT RETROGRADE PYELOGRAM /URETEROSCOPY/HOLMIUM LASER/STENT PLACEMENT (Right)     Patient location during evaluation: PACU Anesthesia Type: General Level of consciousness: awake and alert Pain management: pain level controlled Vital Signs Assessment: post-procedure vital signs reviewed and stable Respiratory status: spontaneous breathing, nonlabored ventilation, respiratory function stable and patient connected to nasal cannula oxygen Cardiovascular status: blood pressure returned to baseline and stable Postop Assessment: no apparent nausea or vomiting Anesthetic complications: no   No notable events documented.  Last Vitals:  Vitals:   07/08/22 1200 07/08/22 1220  BP: (!) 161/88 137/89  Pulse: 66 71  Resp: 13   Temp:  (!) 36.3 C  SpO2: 96% 98%    Last Pain:  Vitals:   07/08/22 1200  TempSrc:   PainSc: Tiger Delara Shepheard

## 2022-07-08 NOTE — Anesthesia Procedure Notes (Addendum)
Procedure Name: LMA Insertion Date/Time: 07/08/2022 10:30 AM  Performed by: Justice Rocher, CRNAPre-anesthesia Checklist: Patient identified, Emergency Drugs available, Suction available, Patient being monitored and Timeout performed Patient Re-evaluated:Patient Re-evaluated prior to induction Oxygen Delivery Method: Circle system utilized Preoxygenation: Pre-oxygenation with 100% oxygen Induction Type: IV induction Ventilation: Mask ventilation without difficulty LMA: LMA inserted LMA Size: 5.0 Number of attempts: 1 Airway Equipment and Method: Bite block Placement Confirmation: positive ETCO2, breath sounds checked- equal and bilateral and CO2 detector Tube secured with: Tape Dental Injury: Teeth and Oropharynx as per pre-operative assessment

## 2022-07-08 NOTE — Transfer of Care (Signed)
Immediate Anesthesia Transfer of Care Note  Patient: Bradley Hull  Procedure(s) Performed: Procedure(s) (LRB): CYSTOSCOPY RIGHT RETROGRADE PYELOGRAM /URETEROSCOPY/HOLMIUM LASER/STENT PLACEMENT (Right)  Patient Location: PACU  Anesthesia Type: General  Level of Consciousness: awake, sedated, patient cooperative and responds to stimulation  Airway & Oxygen Therapy: Patient Spontanous Breathing and Patient connected to RA  Post-op Assessment: Report given to PACU RN, Post -op Vital signs reviewed and stable and Patient moving all extremities  Post vital signs: Reviewed and stable  Complications: No apparent anesthesia complications

## 2022-07-08 NOTE — Discharge Instructions (Addendum)
You may remove the stent on Monday morning by pulling the attached string.  If you don't feel you can do that, call the office to come have it done.  Please bring your stones to the office for analysis.

## 2022-07-08 NOTE — Anesthesia Preprocedure Evaluation (Addendum)
Anesthesia Evaluation  Patient identified by MRN, date of birth, ID band Patient awake    Reviewed: Allergy & Precautions, NPO status , Patient's Chart, lab work & pertinent test results, reviewed documented beta blocker date and time   Airway Mallampati: I  TM Distance: >3 FB Neck ROM: Full    Dental  (+) Edentulous Lower, Edentulous Upper   Pulmonary Current Smoker   breath sounds clear to auscultation       Cardiovascular hypertension, Pt. on medications and Pt. on home beta blockers  Rhythm:Regular Rate:Normal     Neuro/Psych  PSYCHIATRIC DISORDERS      CVA    GI/Hepatic ,GERD  Medicated,,(+) Cirrhosis       , Hepatitis -, C  Endo/Other  diabetes    Renal/GU Renal disease     Musculoskeletal  (+) Arthritis ,    Abdominal   Peds  Hematology   Anesthesia Other Findings   Reproductive/Obstetrics                             Anesthesia Physical Anesthesia Plan  ASA: 3  Anesthesia Plan: General   Post-op Pain Management:    Induction: Intravenous  PONV Risk Score and Plan: 1  Airway Management Planned: LMA  Additional Equipment: None  Intra-op Plan:   Post-operative Plan: Extubation in OR  Informed Consent: I have reviewed the patients History and Physical, chart, labs and discussed the procedure including the risks, benefits and alternatives for the proposed anesthesia with the patient or authorized representative who has indicated his/her understanding and acceptance.     Dental advisory given  Plan Discussed with: CRNA  Anesthesia Plan Comments:        Anesthesia Quick Evaluation

## 2022-07-08 NOTE — Op Note (Signed)
Procedure: 1.  Cystoscopy with right retrograde pyelogram and interpretation. 2.  Right ureteroscopy with homing laser application, stone extraction and insertion of right double-J stent. 3.  Application of fluoroscopy.  Pre-op diagnosis: Right mid ureteral stone.  Postop diagnosis: Right distal ureteral stone.  Surgeon: Dr. Irine Seal.  Anesthesia: General.  Specimen: Stone fragments.  Drains: 6 French by 24 cm right contour double-J stent with tether.  EBL: None.  Complications: None.  Indications: The patient is a 71 year old male with a history of stones to was seen in the office and found to have a 7 mm right mid ureteral stone.  He has elected to undergo ureteroscopy for management.  Procedure: He was taken the operating room where he was given antibiotic.  A general anesthetic was induced.  He was placed in lithotomy position and fitted with PAS hose.  Perineum and genitalia were prepped with Betadine solution and was draped in usual sterile fashion.  Cystoscopy was performed using the 21 [place] scope and 30 degree lens.  Examination revealed a normal urethra.  The external finger was intact.  The prostatic urethra was approximately 2 to 3 cm in length with some lateral lobe hyperplasia and a slightly elevated bladder neck with mucosal changes consistent with his prior radiation therapy with some fixation of the bladder neck.  The bladder wall had mild trabeculation without tumors, stones or inflammation.  Ureteral orifices were unremarkable.  The right ureteral orifice was cannulated with a 5 [place] open-ended catheter and Omnipaque was instilled.  The right retrograde pyelogram demonstrated a normal distal ureter with a filling defect in the distal ureter consistent with a stone.  The more proximal ureter and intrarenal collecting system were unremarkable without filling defects.  A sensor wire was then advanced through the open-ended catheter to the kidney under fluoroscopic  guidance and the open-ended catheter and cystoscope were removed.  The inner core of a 11/13 [place] digital access sheath was then advanced over the wire and the distal ureter was dilated.  The dual-lumen semirigid short ureteroscope was then advanced alongside the wire and the stone was readily visualized.  A 365 m laser fiber was then passed and the Moses laser was set on the dusting setting with 0.3 J and 53 Hz on the left pedal and 1 J and 15 Hz on the right pedal.  The right pedal was used and the stone readily fragmented.  The engage basket was then used to remove the fragments to the bladder and once final ureteroscopic inspection revealed no residual fragments, the ureteroscope was removed.  The cystoscope was then reinserted alongside the wire and the stone fragments were evacuated from the bladder.  The cystoscope was inserted over the wire and a 6 [place] by 24 cm contour double-J stent with tether was advanced the kidney under fluoroscopic guidance without difficulty.  The wire was removed, leaving good coil in the kidney and a good coil in the bladder.  The bladder was drained and the cystoscope was removed leaving the stent string exiting the patient's urethra.  The foreskin was reduced and the stent string was secured to the patient's penis with tape.  He was taken down from lithotomy position, his anesthetic was reversed and he was moved recovery in stable condition.  There were no complications.

## 2022-07-08 NOTE — OR Nursing (Signed)
Stone taken by Dr. Wrenn 

## 2022-07-09 ENCOUNTER — Encounter (HOSPITAL_COMMUNITY): Payer: Self-pay | Admitting: Urology

## 2022-07-10 ENCOUNTER — Encounter (HOSPITAL_COMMUNITY): Payer: Self-pay

## 2022-07-10 ENCOUNTER — Emergency Department (HOSPITAL_COMMUNITY)
Admission: EM | Admit: 2022-07-10 | Discharge: 2022-07-11 | Disposition: A | Discharge status: Home / Self Care | Payer: Medicare Other | Attending: Emergency Medicine | Admitting: Emergency Medicine

## 2022-07-10 ENCOUNTER — Other Ambulatory Visit: Payer: Self-pay

## 2022-07-10 ENCOUNTER — Emergency Department (HOSPITAL_COMMUNITY): Payer: Medicare Other

## 2022-07-10 DIAGNOSIS — R58 Hemorrhage, not elsewhere classified: Secondary | ICD-10-CM | POA: Diagnosis not present

## 2022-07-10 DIAGNOSIS — R109 Unspecified abdominal pain: Secondary | ICD-10-CM | POA: Diagnosis not present

## 2022-07-10 DIAGNOSIS — I10 Essential (primary) hypertension: Secondary | ICD-10-CM | POA: Insufficient documentation

## 2022-07-10 DIAGNOSIS — R1013 Epigastric pain: Secondary | ICD-10-CM | POA: Insufficient documentation

## 2022-07-10 DIAGNOSIS — D72829 Elevated white blood cell count, unspecified: Secondary | ICD-10-CM | POA: Insufficient documentation

## 2022-07-10 DIAGNOSIS — Z743 Need for continuous supervision: Secondary | ICD-10-CM | POA: Diagnosis not present

## 2022-07-10 DIAGNOSIS — Z8546 Personal history of malignant neoplasm of prostate: Secondary | ICD-10-CM | POA: Diagnosis not present

## 2022-07-10 DIAGNOSIS — N200 Calculus of kidney: Secondary | ICD-10-CM | POA: Diagnosis not present

## 2022-07-10 DIAGNOSIS — Z79899 Other long term (current) drug therapy: Secondary | ICD-10-CM | POA: Diagnosis not present

## 2022-07-10 DIAGNOSIS — K5730 Diverticulosis of large intestine without perforation or abscess without bleeding: Secondary | ICD-10-CM | POA: Diagnosis not present

## 2022-07-10 DIAGNOSIS — Z7982 Long term (current) use of aspirin: Secondary | ICD-10-CM | POA: Insufficient documentation

## 2022-07-10 DIAGNOSIS — R1111 Vomiting without nausea: Secondary | ICD-10-CM | POA: Diagnosis not present

## 2022-07-10 DIAGNOSIS — E119 Type 2 diabetes mellitus without complications: Secondary | ICD-10-CM | POA: Diagnosis not present

## 2022-07-10 DIAGNOSIS — R6889 Other general symptoms and signs: Secondary | ICD-10-CM | POA: Diagnosis not present

## 2022-07-10 LAB — CBC
HCT: 44.6 % (ref 39.0–52.0)
Hemoglobin: 14.7 g/dL (ref 13.0–17.0)
MCH: 29.8 pg (ref 26.0–34.0)
MCHC: 33 g/dL (ref 30.0–36.0)
MCV: 90.5 fL (ref 80.0–100.0)
Platelets: 288 10*3/uL (ref 150–400)
RBC: 4.93 MIL/uL (ref 4.22–5.81)
RDW: 14.1 % (ref 11.5–15.5)
WBC: 10.7 10*3/uL — ABNORMAL HIGH (ref 4.0–10.5)
nRBC: 0 % (ref 0.0–0.2)

## 2022-07-10 LAB — LACTIC ACID, PLASMA: Lactic Acid, Venous: 1.3 mmol/L (ref 0.5–1.9)

## 2022-07-10 LAB — COMPREHENSIVE METABOLIC PANEL
ALT: 21 U/L (ref 0–44)
AST: 20 U/L (ref 15–41)
Albumin: 5 g/dL (ref 3.5–5.0)
Alkaline Phosphatase: 95 U/L (ref 38–126)
Anion gap: 13 (ref 5–15)
BUN: 13 mg/dL (ref 8–23)
CO2: 24 mmol/L (ref 22–32)
Calcium: 9.9 mg/dL (ref 8.9–10.3)
Chloride: 104 mmol/L (ref 98–111)
Creatinine, Ser: 0.69 mg/dL (ref 0.61–1.24)
GFR, Estimated: >60 mL/min (ref >60)
Glucose, Bld: 167 mg/dL — ABNORMAL HIGH (ref 70–99)
Potassium: 3.4 mmol/L — ABNORMAL LOW (ref 3.5–5.1)
Sodium: 141 mmol/L (ref 135–145)
Total Bilirubin: 1.4 mg/dL — ABNORMAL HIGH (ref 0.3–1.2)
Total Protein: 9.5 g/dL — ABNORMAL HIGH (ref 6.5–8.1)

## 2022-07-10 LAB — URINALYSIS, ROUTINE W REFLEX MICROSCOPIC
Bilirubin Urine: NEGATIVE
Glucose, UA: 50 mg/dL — AB
Ketones, ur: 20 mg/dL — AB
Nitrite: NEGATIVE
Protein, ur: >=300 mg/dL — AB
RBC / HPF: >50 RBC/hpf — ABNORMAL HIGH (ref 0–5)
Specific Gravity, Urine: 1.014 (ref 1.005–1.030)
pH: 8 (ref 5.0–8.0)

## 2022-07-10 LAB — LIPASE, BLOOD: Lipase: 30 U/L (ref 11–51)

## 2022-07-10 LAB — CBG MONITORING, ED: Glucose-Capillary: 162 mg/dL — ABNORMAL HIGH (ref 70–99)

## 2022-07-10 MED ORDER — IOHEXOL 300 MG/ML  SOLN
100.0000 mL | Freq: Once | INTRAMUSCULAR | Status: AC | PRN
  Administered 2022-07-10: 100 mL via INTRAVENOUS

## 2022-07-10 MED ORDER — MORPHINE SULFATE (PF) 4 MG/ML IV SOLN
4.0000 mg | Freq: Once | INTRAVENOUS | Status: AC
  Administered 2022-07-10: 4 mg via INTRAVENOUS
  Filled 2022-07-10: qty 1

## 2022-07-10 MED ORDER — ONDANSETRON 8 MG PO TBDP: 8.0000 mg | ORAL_TABLET | Freq: Once | ORAL | Status: DC

## 2022-07-10 MED ORDER — ONDANSETRON HCL 4 MG/2ML IJ SOLN
4.0000 mg | Freq: Once | INTRAMUSCULAR | Status: AC
  Administered 2022-07-10: 4 mg via INTRAVENOUS
  Filled 2022-07-10: qty 2

## 2022-07-10 NOTE — ED Triage Notes (Signed)
Pt reporting generalized abdominal pain, vomiting/diarrhea. Tried to take his medications, but vomited after taking.

## 2022-07-10 NOTE — ED Provider Triage Note (Addendum)
Emergency Medicine Provider Triage Evaluation Note  Bradley Hull , a 71 y.o. male  was evaluated in triage.  Pt complains of abdominal pain, nausea and vomiting x 2 to days.  Unable to eat or drink anything after having cystoscopy 07/08/2022.  He is having diarrhea, nonbilious and bloody emesis.  Has not noticed any fevers at home but temperature is elevated 90.2 here in the ED.  Diffuse abdominal pain without focality.  Denies any chest pain.  Feels lightheaded.  Review of Systems  Per HPI  Physical Exam  BP (!) 179/90 (BP Location: Right Arm)   Pulse 95   Temp 99.2 F (37.3 C) (Oral)   Resp 16   SpO2 97%  Gen:   Awake, ill appearing Resp:  Normal effort  MSK:   Moves extremities without difficulty  Other:  Diffuse tenderness   Medical Decision Making  Medically screening exam initiated at 8:10 PM.  Appropriate orders placed.  Bradley Hull was informed that the remainder of the evaluation will be completed by another provider, this initial triage assessment does not replace that evaluation, and the importance of remaining in the ED until their evaluation is complete.     Sherrill Raring, PA-C 07/10/22 2011    Sherrill Raring, PA-C 07/10/22 2011

## 2022-07-10 NOTE — ED Provider Notes (Signed)
[facility] DEPT Provider Note   CSN: [id number] Arrival date & time: 07/10/22  1946     History {Add pertinent medical, surgical, social history, OB history to HPI:1} No chief complaint on file.   Bradley Hull is a 71 y.o. male.  HPI   Patient with medical history including hypertension, kidney stones, cirrhosis, type 2 diabetes, prostate cancer status post cystoscopy with right-sided stents placed by Dr. Thurmond Butts on the 15th presents with complaints of stomach pain, patient states that he remove the stent today.  Patient states pain started yesterday, states it is in the middle of his abdomen, does not radiate, has remained constant, states he is unable to tolerate p.o., denies bloody emesis or coffee-ground emesis, still passing gas have normal bowel movements denies melena or bloody stools, he states he is still urinary without difficulty, he has no fevers no chills no cough no congestion no general body aches, he denies any similar abdominal surgeries, admits to both alcohol use and NSAID use was never diagnosed with an stomach ulcer.    Home Medications Prior to Admission medications   Medication Sig Start Date End Date Taking? Authorizing Provider  amLODipine (NORVASC) 10 MG tablet Take 1 tablet (10 mg total) by mouth daily. 06/16/22   [name], MD  aspirin EC 81 MG tablet Take 81 mg by mouth daily. Swallow whole.    [provider]  atorvastatin (LIPITOR) 80 MG tablet Take 1 tablet (80 mg total) by mouth at bedtime. 06/16/22   [name], MD  blood glucose meter kit and supplies Dispense based on patient and insurance preference. Use up to two times daily as directed. (FOR ICD-10 E10.9, E11.9). 06/20/22   [name], MD  carvedilol (COREG) 6.25 MG tablet Take 1 tablet (6.25 mg total) by mouth 2 (two) times daily with a meal. 06/16/22   [name], MD  celecoxib (CELEBREX) 200 MG capsule Take 200 mg by mouth 2 (two)  times daily. 06/20/22   [provider]  Continuous Blood Gluc Receiver (FREESTYLE LIBRE 2 READER) DEVI 1 Act by Does not apply route daily. 06/16/22   [name], MD  Continuous Blood Gluc Sensor (FREESTYLE LIBRE 2 SENSOR) MISC USE AS DIRECTED 07/05/22   [name], MD  diclofenac Sodium (VOLTAREN) 1 % GEL Apply 1 Application topically daily as needed (pain). 05/03/22   [provider]  Glucagon (GVOKE HYPOPEN 2-PACK) 1 MG/0.2ML SOAJ Inject 1 Act into the skin daily as needed. 06/01/21   [name], MD  levETIRAcetam (KEPPRA) 500 MG tablet Take 500 mg by mouth in the morning and at bedtime. 10/06/21   [provider]  metoprolol tartrate (LOPRESSOR) 25 MG tablet Take 12.5 mg by mouth 2 (two) times daily.    [provider]  omeprazole (PRILOSEC) 40 MG capsule Use Prilosec (omeprazole) 40 mg PO BID for 6 weeks to promote mucosal healing, then reduce to 40 mg daily and continue to titrate to lowest effective dose if abdominal symptoms resolved. Patient not taking: Reported on 01/26/2022 09/10/20   Cirigliano, Vito V, DO  ondansetron (ZOFRAN-ODT) 8 MG disintegrating tablet Take 8 mg by mouth every 8 (eight) hours as needed for nausea or vomiting. 06/17/22   [provider]  [place] 100-33 UNT-MCG/ML SOPN INJECT 60 UNITS SUBCUTANEOUSLY ONCE DAILY Patient not taking: Reported on 06/24/2022 05/12/22   [name], MD  tamsulosin (FLOMAX) 0.4 MG CAPS capsule Take 0.4 mg by mouth  daily.    [provider]      Allergies    Dilaudid [hydromorphone hcl]    Review of Systems   Review of Systems  Constitutional:  Negative for chills and fever.  Respiratory:  Negative for shortness of breath.   Cardiovascular:  Negative for chest pain.  Gastrointestinal:  Positive for abdominal pain, nausea and vomiting.  Neurological:  Negative for headaches.    Physical Exam Updated Vital Signs BP (!) 195/94   Pulse 81   Temp 99.2 F (37.3 C)  (Oral)   Resp 19   SpO2 97%  Physical Exam Vitals and nursing note reviewed.  Constitutional:      General: He is not in acute distress.    Appearance: He is not ill-appearing.  HENT:     Head: Normocephalic and atraumatic.     Nose: No congestion.  Eyes:     Conjunctiva/sclera: Conjunctivae normal.  Cardiovascular:     Rate and Rhythm: Normal rate and regular rhythm.     Pulses: Normal pulses.     Heart sounds: No murmur heard.    No friction rub. No gallop.  Pulmonary:     Effort: No respiratory distress.     Breath sounds: No wheezing, rhonchi or rales.  Abdominal:     Palpations: Abdomen is soft.     Tenderness: There is abdominal tenderness. There is no right CVA tenderness or left CVA tenderness.     Comments: Abdomen nondistended, soft, noted tenderness mainly around his epigastric region without guarding rebound has or peritoneal sign no flank tenderness or side tenderness.  Musculoskeletal:     Right lower leg: No edema.     Left lower leg: No edema.  Skin:    General: Skin is warm and dry.  Neurological:     Mental Status: He is alert.  Psychiatric:        Mood and Affect: Mood normal.     ED Results / Procedures / Treatments   Labs (all labs ordered are listed, but only abnormal results are displayed) Labs Reviewed  COMPREHENSIVE METABOLIC PANEL - Abnormal; Notable for the following components:      Result Value   Potassium 3.4 (*)    Glucose, Bld 167 (*)    Total Protein 9.5 (*)    Total Bilirubin 1.4 (*)    All other components within normal limits  CBC - Abnormal; Notable for the following components:   WBC 10.7 (*)    All other components within normal limits  URINALYSIS, ROUTINE W REFLEX MICROSCOPIC - Abnormal; Notable for the following components:   APPearance HAZY (*)    Glucose, UA 50 (*)    Hgb urine dipstick LARGE (*)    Ketones, ur 20 (*)    Protein, ur >=300 (*)    Leukocytes,Ua TRACE (*)    RBC / HPF >50 (*)    Bacteria, UA RARE (*)     All other components within normal limits  CBG MONITORING, ED - Abnormal; Notable for the following components:   Glucose-Capillary 162 (*)    All other components within normal limits  LIPASE, BLOOD  LACTIC ACID, PLASMA  LACTIC ACID, PLASMA    EKG None  Radiology No results found.  Procedures Procedures  {Document cardiac monitor, telemetry assessment procedure when appropriate:1}  Medications Ordered in ED Medications  ondansetron (ZOFRAN) injection 4 mg (has no administration in time range)  morphine (PF) 4 MG/ML injection 4 mg (has no administration in time range)  ED Course/ Medical Decision Making/ A&P                           Medical Decision Making Risk Prescription drug management.   This patient presents to the ED for concern of abdominal pain, this involves an extensive number of treatment options, and is a complaint that carries with it a high risk of complications and morbidity.  The differential diagnosis includes ruptured stomach ulcer, diverticulitis, pancreatitis, abdominal infection    Additional history obtained:  Additional history obtained from N/A External records from outside source obtained and reviewed including urology notes   Co morbidities that complicate the patient evaluation  DM  Social Determinants of Health:  N/A    Lab Tests:  I Ordered, and personally interpreted labs.  The pertinent results include: CBC shows leukocytosis of 10.7, CMP shows potassium 3.4, glucose 167, T. bili 1.4, lipase is 30, UA shows trace leukocytes red blood cells white blood cells rare bacteria lactic 1.3   Imaging Studies ordered:  I ordered imaging studies including CT AP I independently visualized and interpreted imaging which showed *** I agree with the radiologist interpretation   Cardiac Monitoring:  The patient was maintained on a cardiac monitor.  I personally viewed and interpreted the cardiac monitored which showed an  underlying rhythm of: N/A   Medicines ordered and prescription drug management:  I ordered medication including Dilaudid, pain medication I have reviewed the patients home medicines and have made adjustments as needed  Critical Interventions:  N/A   Reevaluation:  Presents with abdominal pain, triage obtain basic lab work, which appeared to reviewed, slight leukocytosis with blood noted on his UA, he had a benign physical exam, will continue with CT abdomen pelvis and reassess.   Consultations Obtained:  I requested consultation with the urology resident Dr. Ronalee Belts entering,  and discussed lab and imaging findings as well as pertinent plan - they recommend: Likely this is ureter spasms, as long as CT imaging is negative, can discharge home on prophylactic antibiotics, antiemetics, Flomax and can follow-up as an outpatient.    Test Considered:  N/A    Rule out low suspicion for lower lobe pneumonia as lung sounds are clear bilaterally, will defer imaging at this time.  I have low suspicion for liver or gallbladder abnormality as she has no right upper quadrant tenderness, liver enzymes, alk phos, T bili all within normal limits.  Low suspicion for pancreatitis as lipase is within normal limits.  Low suspicion for ruptured stomach ulcer as she has no peritoneal sign present on exam.  Suspicion for bowel obstruction, diverticulitis, volvulus, AAA, intra-abdominal abscess, kidney stone, so at this time CT imaging is all negative these findings.  I doubt patient is septic as patient is nontoxic-appearing, vital signs are reassuring, he does have a slight leukocytosis but expect this more acute phase reactant from pain from bladder spasms.     Dispostion and problem list  After consideration of the diagnostic results and the patients response to treatment, I feel that the patent would benefit from discharge.  Abdominal pain      {Document critical care time when  appropriate:1} {Document review of labs and clinical decision tools ie heart score, Chads2Vasc2 etc:1}  {Document your independent review of radiology images, and any outside records:1} {Document your discussion with family members, caretakers, and with consultants:1} {Document social determinants of health affecting pt's care:1} {Document your decision making why or why not admission,  treatments were needed:1} Final Clinical Impression(s) / ED Diagnoses Final diagnoses:  None    Rx / DC Orders ED Discharge Orders     None

## 2022-07-10 NOTE — ED Triage Notes (Addendum)
Pt arrives from home via [facility], per report, pt had kidney stone/stent removed Friday. Yesterday, onset of n/v/d and abdominal pain. 208/106, hr 90, cbg 174, 99.9 temp.  A/o x 4. Denies urinary difficulty.

## 2022-07-11 ENCOUNTER — Encounter (HOSPITAL_COMMUNITY): Payer: Self-pay

## 2022-07-11 DIAGNOSIS — N200 Calculus of kidney: Secondary | ICD-10-CM | POA: Diagnosis not present

## 2022-07-11 DIAGNOSIS — R1013 Epigastric pain: Secondary | ICD-10-CM | POA: Diagnosis not present

## 2022-07-11 DIAGNOSIS — K5730 Diverticulosis of large intestine without perforation or abscess without bleeding: Secondary | ICD-10-CM | POA: Diagnosis not present

## 2022-07-11 MED ORDER — TAMSULOSIN HCL 0.4 MG PO CAPS
0.4000 mg | ORAL_CAPSULE | Freq: Once | ORAL | Status: AC
  Administered 2022-07-11: 0.4 mg via ORAL
  Filled 2022-07-11: qty 1

## 2022-07-11 MED ORDER — METOCLOPRAMIDE HCL 5 MG/ML IJ SOLN
10.0000 mg | Freq: Once | INTRAMUSCULAR | Status: AC
  Administered 2022-07-11: 10 mg via INTRAVENOUS
  Filled 2022-07-11: qty 2

## 2022-07-11 MED ORDER — ONDANSETRON HCL 4 MG PO TABS: 4.0000 mg | ORAL_TABLET | Freq: Four times a day (QID) | ORAL | 0 refills | Status: DC

## 2022-07-11 MED ORDER — CEPHALEXIN 500 MG PO CAPS: 500.0000 mg | ORAL_CAPSULE | Freq: Two times a day (BID) | ORAL | 0 refills | Status: DC

## 2022-07-11 MED ORDER — MORPHINE SULFATE (PF) 4 MG/ML IV SOLN
4.0000 mg | Freq: Once | INTRAVENOUS | Status: AC
  Administered 2022-07-11: 4 mg via INTRAVENOUS
  Filled 2022-07-11: qty 1

## 2022-07-11 MED ORDER — TAMSULOSIN HCL 0.4 MG PO CAPS: 0.4000 mg | ORAL_CAPSULE | Freq: Every day | ORAL | 0 refills | Status: DC

## 2022-07-11 NOTE — Discharge Instructions (Signed)
Likely your pain is from spasming ureter which is the tube that connects your kidney to your bladder, I have given you Flomax please take as prescribed.  I have also given you Zofran this will help with your nausea, and have started you on an antibiotic Keflex please take as prescribed  Follow-up with your urologist on Wednesday for further evaluation.  Come back to the emergency department if you develop chest pain, shortness of breath, severe abdominal pain, uncontrolled nausea, vomiting, diarrhea.

## 2022-07-12 ENCOUNTER — Emergency Department (HOSPITAL_COMMUNITY)
Admission: EM | Admit: 2022-07-12 | Discharge: 2022-07-12 | Discharge status: Against Medical Advice | Payer: Medicare Other | Attending: Medical | Admitting: Medical

## 2022-07-12 ENCOUNTER — Other Ambulatory Visit: Payer: Self-pay

## 2022-07-12 ENCOUNTER — Telehealth: Payer: Self-pay | Admitting: Internal Medicine

## 2022-07-12 DIAGNOSIS — R7309 Other abnormal glucose: Secondary | ICD-10-CM | POA: Insufficient documentation

## 2022-07-12 DIAGNOSIS — Z5321 Procedure and treatment not carried out due to patient leaving prior to being seen by health care provider: Secondary | ICD-10-CM | POA: Diagnosis not present

## 2022-07-12 DIAGNOSIS — R1084 Generalized abdominal pain: Secondary | ICD-10-CM | POA: Diagnosis not present

## 2022-07-12 DIAGNOSIS — Z743 Need for continuous supervision: Secondary | ICD-10-CM | POA: Diagnosis not present

## 2022-07-12 DIAGNOSIS — R112 Nausea with vomiting, unspecified: Secondary | ICD-10-CM | POA: Diagnosis not present

## 2022-07-12 DIAGNOSIS — R6889 Other general symptoms and signs: Secondary | ICD-10-CM | POA: Diagnosis not present

## 2022-07-12 DIAGNOSIS — R197 Diarrhea, unspecified: Secondary | ICD-10-CM | POA: Diagnosis not present

## 2022-07-12 DIAGNOSIS — R109 Unspecified abdominal pain: Secondary | ICD-10-CM | POA: Insufficient documentation

## 2022-07-12 DIAGNOSIS — N200 Calculus of kidney: Secondary | ICD-10-CM | POA: Diagnosis not present

## 2022-07-12 DIAGNOSIS — R1111 Vomiting without nausea: Secondary | ICD-10-CM | POA: Diagnosis not present

## 2022-07-12 LAB — CBG MONITORING, ED: Glucose-Capillary: 143 mg/dL — ABNORMAL HIGH (ref 70–99)

## 2022-07-12 MED ORDER — SODIUM CHLORIDE 0.9 % IV BOLUS: 500.0000 mL | Freq: Once | INTRAVENOUS | Status: DC

## 2022-07-12 MED ORDER — TAMSULOSIN HCL 0.4 MG PO CAPS: 0.4000 mg | ORAL_CAPSULE | Freq: Every day | ORAL | 0 refills | Status: AC

## 2022-07-12 MED ORDER — ONDANSETRON HCL 4 MG PO TABS: 4.0000 mg | ORAL_TABLET | Freq: Four times a day (QID) | ORAL | 0 refills | Status: DC

## 2022-07-12 MED ORDER — CEPHALEXIN 500 MG PO CAPS: 500.0000 mg | ORAL_CAPSULE | Freq: Two times a day (BID) | ORAL | 0 refills | Status: AC

## 2022-07-12 NOTE — ED Notes (Signed)
Unable to get labs on pt. Pt stated he gets ultrasound IV

## 2022-07-12 NOTE — Telephone Encounter (Signed)
Pharmacy has been changed and Rx's sent.

## 2022-07-12 NOTE — ED Provider Triage Note (Signed)
Emergency Medicine Provider Triage Evaluation Note  Bradley Hull , a 71 y.o. male  was evaluated in triage.  Presents to the ER for abdominal pain nausea vomiting.  He reports symptoms ongoing for the past several days gradually worsening reports 2 ER visits in the past week for the same.  He reports that he was prescribed Keflex for possible urinary tract infection 2 days ago but the antibiotics were sent to [place] he has not been able to take this medicine.  Review of Systems  Positive: Abdominal pain, nausea, vomiting Negative: Fever, chills, chest pain, shortness of breath, fall/injury, dysuria  Physical Exam  BP 125/85 (BP Location: Left Arm)   Pulse 89   Temp 98.4 F (36.9 C) (Oral)   Resp 18   Ht '5\' 5"'$  (1.651 m)   Wt 99 kg   SpO2 100%   BMI 36.32 kg/m  Gen:   Awake, no distress   Resp:  Normal effort  MSK:   Moves extremities without difficulty  Other:  Mild diffuse TTP of the abdomen.  Abdomen is soft.  No peritoneal signs.  Medical Decision Making  Medically screening exam initiated at 9:13 AM.  Appropriate orders placed.  Bradley Hull was informed that the remainder of the evaluation will be completed by another provider, this initial triage assessment does not replace that evaluation, and the importance of remaining in the ED until their evaluation is complete.    Note: Portions of this report may have been transcribed using voice recognition software. Every effort was made to ensure accuracy; however, inadvertent computerized transcription errors may still be present.    Deliah Boston, [place] 07/12/22 [phone]

## 2022-07-12 NOTE — ED Triage Notes (Signed)
BIB EMS with abd pain, seen on the 17th did not get antibiotics for his post kidney stone procedure. 180/100-80-22-99% CBG 144

## 2022-07-12 NOTE — Telephone Encounter (Signed)
Patients brother called because they had received a call and that patient was on his way to the hospital. He said the hospital sent to [facility] in [facility] [place], [place] by accident. He asked if Dr Ronnald Ramp could get them sent to  [facility] in [place] cephALEXin [facility]) 500 MG capsule  tamsulosin (FLOMAX) 0.4 MG CAPS capsule  ondansetron (ZOFRAN) 4 MG tablet

## 2022-07-15 ENCOUNTER — Telehealth: Payer: Self-pay | Admitting: Internal Medicine

## 2022-07-15 ENCOUNTER — Telehealth: Payer: Self-pay

## 2022-07-15 ENCOUNTER — Other Ambulatory Visit: Payer: Self-pay | Admitting: Internal Medicine

## 2022-07-15 ENCOUNTER — Other Ambulatory Visit: Payer: Self-pay | Admitting: Cardiovascular Disease

## 2022-07-15 DIAGNOSIS — Z794 Long term (current) use of insulin: Secondary | ICD-10-CM

## 2022-07-15 MED ORDER — GVOKE HYPOPEN 2-PACK 1 MG/0.2ML ~~LOC~~ SOAJ: 1.0000 | Freq: Every day | SUBCUTANEOUS | 5 refills | Status: DC | PRN

## 2022-07-15 NOTE — Telephone Encounter (Signed)
Patient needs his metoprolol tartraute '25mg'$  refilled at [facility] in [place].

## 2022-07-15 NOTE — Telephone Encounter (Signed)
Pt called stating he hasn't been able to get his blood sugar up , it has been low all night in the 60s and 70s , says he has been eating and had a half of gallon of orange juice. Also states his stool is black.

## 2022-07-20 ENCOUNTER — Telehealth: Payer: Self-pay

## 2022-07-20 DIAGNOSIS — N201 Calculus of ureter: Secondary | ICD-10-CM | POA: Diagnosis not present

## 2022-07-20 NOTE — Telephone Encounter (Signed)
        Patient  visited [place] on 12/18    Telephone encounter attempt :  1st  A HIPAA compliant voice message was left requesting a return call.  Instructed patient to call back     [place], [facility]  [phone] [address], [place], [place] [address] Phone: [phone] Email: [email or web address]

## 2022-07-21 ENCOUNTER — Telehealth: Payer: Self-pay

## 2022-07-21 NOTE — Telephone Encounter (Signed)
        Patient  visited [place] on 12/18   Telephone encounter attempt :  2nd   Unable to leave a message   [place], [place] [facility]  [phone] [address], [place], [place] [address] Phone: [phone] Email: [email or web address]

## 2022-07-26 ENCOUNTER — Telehealth: Payer: Self-pay | Admitting: Internal Medicine

## 2022-07-26 ENCOUNTER — Other Ambulatory Visit: Payer: Self-pay | Admitting: Internal Medicine

## 2022-07-26 NOTE — Telephone Encounter (Signed)
Caller & Relationship to patient: PT  Call back number: [phone]  Date of last office visit: 04/12/2022  Date of next office visit: 08/24/2022  Medication(s) to be refilled:  levETIRAcetam (KEPPRA) 500 MG tablet   Preferred Pharmacy:   PT is out of town and is requesting this RX be sent to the   [facility] at [address] [place], [address]

## 2022-07-26 NOTE — Telephone Encounter (Signed)
Calledpt, LVM to discuss Rx need

## 2022-07-29 ENCOUNTER — Other Ambulatory Visit: Payer: Self-pay | Admitting: Internal Medicine

## 2022-07-29 DIAGNOSIS — G40909 Epilepsy, unspecified, not intractable, without status epilepticus: Secondary | ICD-10-CM | POA: Insufficient documentation

## 2022-07-29 MED ORDER — LEVETIRACETAM 500 MG PO TABS: 500.0000 mg | ORAL_TABLET | Freq: Two times a day (BID) | ORAL | 0 refills | Status: DC

## 2022-07-29 NOTE — Telephone Encounter (Signed)
Patient has called back and stated this medication is supposed to help prevent seizures, as it was prescribed to him after he had a stroke, he was told this is supposed to also help open up the blockage he has, and he says he will be out of the medicine in the next 5 days or so. If it can be fill he still wants it sent to the [facility] on [address] in [place]

## 2022-08-04 NOTE — Telephone Encounter (Signed)
Per chart pt made appt for 08/24/22. Closing encounter

## 2022-08-05 ENCOUNTER — Other Ambulatory Visit: Payer: Self-pay | Admitting: Internal Medicine

## 2022-08-05 DIAGNOSIS — I10 Essential (primary) hypertension: Secondary | ICD-10-CM

## 2022-08-06 ENCOUNTER — Other Ambulatory Visit: Payer: Self-pay | Admitting: Internal Medicine

## 2022-08-10 ENCOUNTER — Other Ambulatory Visit: Payer: Self-pay | Admitting: Internal Medicine

## 2022-08-10 DIAGNOSIS — I10 Essential (primary) hypertension: Secondary | ICD-10-CM

## 2022-08-24 ENCOUNTER — Encounter: Payer: Self-pay | Admitting: Internal Medicine

## 2022-08-24 ENCOUNTER — Ambulatory Visit (INDEPENDENT_AMBULATORY_CARE_PROVIDER_SITE_OTHER): Payer: 59 | Admitting: Internal Medicine

## 2022-08-24 VITALS — BP 136/82 | HR 77 | Temp 98.2°F | Resp 16 | Ht 65.0 in | Wt 224.0 lb

## 2022-08-24 DIAGNOSIS — E785 Hyperlipidemia, unspecified: Secondary | ICD-10-CM

## 2022-08-24 DIAGNOSIS — E119 Type 2 diabetes mellitus without complications: Secondary | ICD-10-CM

## 2022-08-24 DIAGNOSIS — Z720 Tobacco use: Secondary | ICD-10-CM

## 2022-08-24 DIAGNOSIS — E876 Hypokalemia: Secondary | ICD-10-CM

## 2022-08-24 DIAGNOSIS — N401 Enlarged prostate with lower urinary tract symptoms: Secondary | ICD-10-CM

## 2022-08-24 DIAGNOSIS — Z794 Long term (current) use of insulin: Secondary | ICD-10-CM | POA: Diagnosis not present

## 2022-08-24 DIAGNOSIS — I10 Essential (primary) hypertension: Secondary | ICD-10-CM | POA: Diagnosis not present

## 2022-08-24 DIAGNOSIS — Z0000 Encounter for general adult medical examination without abnormal findings: Secondary | ICD-10-CM

## 2022-08-24 DIAGNOSIS — Z0001 Encounter for general adult medical examination with abnormal findings: Secondary | ICD-10-CM

## 2022-08-24 DIAGNOSIS — Z23 Encounter for immunization: Secondary | ICD-10-CM

## 2022-08-24 NOTE — Patient Instructions (Signed)
Health Maintenance, Male Adopting a healthy lifestyle and getting preventive care are important in promoting health and wellness. Ask your health care provider about: The right schedule for you to have regular tests and exams. Things you can do on your own to prevent diseases and keep yourself healthy. What should I know about diet, weight, and exercise? Eat a healthy diet  Eat a diet that includes plenty of vegetables, fruits, low-fat dairy products, and lean protein. Do not eat a lot of foods that are high in solid fats, added sugars, or sodium. Maintain a healthy weight Body mass index (BMI) is a measurement that can be used to identify possible weight problems. It estimates body fat based on height and weight. Your health care provider can help determine your BMI and help you achieve or maintain a healthy weight. Get regular exercise Get regular exercise. This is one of the most important things you can do for your health. Most adults should: Exercise for at least 150 minutes each week. The exercise should increase your heart rate and make you sweat (moderate-intensity exercise). Do strengthening exercises at least twice a week. This is in addition to the moderate-intensity exercise. Spend less time sitting. Even light physical activity can be beneficial. Watch cholesterol and blood lipids Have your blood tested for lipids and cholesterol at 70 years of age, then have this test every 5 years. You may need to have your cholesterol levels checked more often if: Your lipid or cholesterol levels are high. You are older than 70 years of age. You are at high risk for heart disease. What should I know about cancer screening? Many types of cancers can be detected early and may often be prevented. Depending on your health history and family history, you may need to have cancer screening at various ages. This may include screening for: Colorectal cancer. Prostate cancer. Skin cancer. Lung  cancer. What should I know about heart disease, diabetes, and high blood pressure? Blood pressure and heart disease High blood pressure causes heart disease and increases the risk of stroke. This is more likely to develop in people who have high blood pressure readings or are overweight. Talk with your health care provider about your target blood pressure readings. Have your blood pressure checked: Every 3-5 years if you are 18-39 years of age. Every year if you are 40 years old or older. If you are between the ages of 65 and 75 and are a current or former smoker, ask your health care provider if you should have a one-time screening for abdominal aortic aneurysm (AAA). Diabetes Have regular diabetes screenings. This checks your fasting blood sugar level. Have the screening done: Once every three years after age 45 if you are at a normal weight and have a low risk for diabetes. More often and at a younger age if you are overweight or have a high risk for diabetes. What should I know about preventing infection? Hepatitis B If you have a higher risk for hepatitis B, you should be screened for this virus. Talk with your health care provider to find out if you are at risk for hepatitis B infection. Hepatitis C Blood testing is recommended for: Everyone born from 1945 through 1965. Anyone with known risk factors for hepatitis C. Sexually transmitted infections (STIs) You should be screened each year for STIs, including gonorrhea and chlamydia, if: You are sexually active and are younger than 70 years of age. You are older than 70 years of age and your   health care provider tells you that you are at risk for this type of infection. Your sexual activity has changed since you were last screened, and you are at increased risk for chlamydia or gonorrhea. Ask your health care provider if you are at risk. Ask your health care provider about whether you are at high risk for HIV. Your health care provider  may recommend a prescription medicine to help prevent HIV infection. If you choose to take medicine to prevent HIV, you should first get tested for HIV. You should then be tested every 3 months for as long as you are taking the medicine. Follow these instructions at home: Alcohol use Do not drink alcohol if your health care provider tells you not to drink. If you drink alcohol: Limit how much you have to 0-2 drinks a day. Know how much alcohol is in your drink. In the [place] one drink equals one 12 oz bottle of beer (355 mL), one 5 oz glass of wine (148 mL), or one 1 oz glass of hard liquor (44 mL). Lifestyle Do not use any products that contain nicotine or tobacco. These products include cigarettes, chewing tobacco, and vaping devices, such as e-cigarettes. If you need help quitting, ask your health care provider. Do not use street drugs. Do not share needles. Ask your health care provider for help if you need support or information about quitting drugs. General instructions Schedule regular health, dental, and eye exams. Stay current with your vaccines. Tell your health care provider if: You often feel depressed. You have ever been abused or do not feel safe at home. Summary Adopting a healthy lifestyle and getting preventive care are important in promoting health and wellness. Follow your health care provider's instructions about healthy diet, exercising, and getting tested or screened for diseases. Follow your health care provider's instructions on monitoring your cholesterol and blood pressure. This information is not intended to replace advice given to you by your health care provider. Make sure you discuss any questions you have with your health care provider. Document Revised: 11/30/2020 Document Reviewed: 11/30/2020 Elsevier Patient Education  2023 [employer]  

## 2022-08-24 NOTE — Progress Notes (Signed)
Subjective:  Patient ID: Bradley Hull, male    DOB: Sep 16, 1950  Age: 72 y.o. MRN: [id number]  CC: Annual Exam, Hypertension, and Hyperlipidemia   HPI DRAYSEN WEYGANDT presents for a CPX and f/up -   He denies chest pain, shortness of breath, diaphoresis, or edema.  Outpatient Medications Prior to Visit  Medication Sig Dispense Refill   amLODipine (NORVASC) 10 MG tablet Take 1 tablet (10 mg total) by mouth daily. 90 tablet 1   aspirin EC 81 MG tablet Take 81 mg by mouth daily. Swallow whole.     atorvastatin (LIPITOR) 80 MG tablet Take 1 tablet (80 mg total) by mouth at bedtime. 90 tablet 1   Blood Glucose Monitoring Suppl (ACCU-CHEK GUIDE ME) w/Device KIT USE AS DIRECTED 1 kit 1   carvedilol (COREG) 6.25 MG tablet TAKE 1 TABLET BY MOUTH TWICE DAILY WITH A MEAL 180 tablet 0   celecoxib (CELEBREX) 200 MG capsule Take 200 mg by mouth 2 (two) times daily.     Continuous Blood Gluc Receiver (FREESTYLE LIBRE 2 READER) DEVI 1 Act by Does not apply route daily. 2 each 5   Continuous Blood Gluc Sensor (FREESTYLE LIBRE 2 SENSOR) MISC USE AS DIRECTED 2 each 5   diclofenac Sodium (VOLTAREN) 1 % GEL Apply 1 Application topically daily as needed (pain).     Glucagon (GVOKE HYPOPEN 2-PACK) 1 MG/0.2ML SOAJ Inject 1 Act into the skin daily as needed. 2 mL 5   levETIRAcetam (KEPPRA) 500 MG tablet Take 1 tablet (500 mg total) by mouth 2 (two) times daily. 180 tablet 0   metoprolol tartrate (LOPRESSOR) 25 MG tablet Take 12.5 mg by mouth 2 (two) times daily.     omeprazole (PRILOSEC) 40 MG capsule Use Prilosec (omeprazole) 40 mg PO BID for 6 weeks to promote mucosal healing, then reduce to 40 mg daily and continue to titrate to lowest effective dose if abdominal symptoms resolved. 180 capsule 1   ondansetron (ZOFRAN) 4 MG tablet Take 1 tablet (4 mg total) by mouth every 6 (six) hours. 12 tablet 0   ondansetron (ZOFRAN-ODT) 8 MG disintegrating tablet Take 8 mg by mouth every 8 (eight) hours as needed for  nausea or vomiting.     SOLIQUA 100-33 UNT-MCG/ML SOPN INJECT 60 UNITS SUBCUTANEOUSLY ONCE DAILY 54 mL 0   No facility-administered medications prior to visit.    ROS Review of Systems  Constitutional:  Positive for unexpected weight change (wt gain). Negative for appetite change, chills, diaphoresis and fatigue.  HENT: Negative.    Eyes: Negative.   Respiratory:  Negative for cough, chest tightness, shortness of breath and wheezing.   Cardiovascular:  Negative for chest pain, palpitations and leg swelling.  Gastrointestinal:  Negative for abdominal pain, diarrhea, nausea and vomiting.  Endocrine: Negative.   Genitourinary: Negative.  Negative for difficulty urinating.  Musculoskeletal: Negative.  Negative for arthralgias and myalgias.  Skin: Negative.     Objective:  BP 136/82 (BP Location: Right Arm, Patient Position: Sitting, Cuff Size: Large)   Pulse 77   Temp 98.2 F (36.8 C) (Oral)   Resp 16   Ht '5\' 5"'$  (1.651 m)   Wt 224 lb (101.6 kg)   SpO2 95%   BMI 37.28 kg/m   BP Readings from Last 3 Encounters:  08/24/22 136/82  07/12/22 (!) 145/85  07/11/22 (!) 175/86    Wt Readings from Last 3 Encounters:  08/24/22 224 lb (101.6 kg)  07/12/22 218 lb 4.1 oz (99 kg)  07/11/22 218 lb 4.1 oz (99 kg)    Physical Exam Vitals reviewed.  Constitutional:      Appearance: He is obese. He is not ill-appearing.  HENT:     Nose: Nose normal.     Mouth/Throat:     Mouth: Mucous membranes are moist.  Eyes:     General: No scleral icterus.    Conjunctiva/sclera: Conjunctivae normal.  Cardiovascular:     Rate and Rhythm: Normal rate and regular rhythm.     Heart sounds: No murmur heard. Pulmonary:     Effort: Pulmonary effort is normal.     Breath sounds: No stridor. No wheezing, rhonchi or rales.  Abdominal:     General: Abdomen is protuberant. There is no distension.     Palpations: There is no hepatomegaly, splenomegaly or mass.     Tenderness: There is no abdominal  tenderness. There is no guarding.     Hernia: No hernia is present.  Musculoskeletal:        General: Normal range of motion.     Cervical back: Neck supple.     Right lower leg: No edema.     Left lower leg: No edema.  Lymphadenopathy:     Cervical: No cervical adenopathy.  Skin:    General: Skin is warm and dry.  Neurological:     General: No focal deficit present.     Mental Status: He is alert. Mental status is at baseline.  Psychiatric:        Mood and Affect: Mood normal.        Behavior: Behavior normal.     Lab Results  Component Value Date   WBC 10.7 (H) 07/10/2022   HGB 14.7 07/10/2022   HCT 44.6 07/10/2022   PLT 288 07/10/2022   GLUCOSE 167 (H) 07/10/2022   CHOL 97 01/26/2022   TRIG 104 01/26/2022   HDL 45 01/26/2022   LDLCALC 31 01/26/2022   ALT 21 07/10/2022   AST 20 07/10/2022   NA 141 07/10/2022   K 3.4 (L) 07/10/2022   CL 104 07/10/2022   CREATININE 0.69 07/10/2022   BUN 13 07/10/2022   CO2 24 07/10/2022   TSH 1.03 02/04/2021   PSA 0.0 04/12/2022   INR 1.2 09/09/2020   HGBA1C 6.5 (H) 06/27/2022   MICROALBUR 4.9 (H) 07/27/2021    No results found.  Assessment & Plan:   Butler was seen today for annual exam, hypertension and hyperlipidemia.  Diagnoses and all orders for this visit:  Essential hypertension- His blood pressure is well-controlled. -     TSH; Future -     Basic metabolic panel; Future -     Magnesium; Future -     Magnesium -     Basic metabolic panel -     TSH  Encounter for general adult medical examination with abnormal findings- Exam completed, labs reviewed, vaccines reviewed and updated, cancer screenings are up-to-date.  Benign prostatic hyperplasia with lower urinary tract symptoms, symptom details unspecified- Recent PSA was 0. -     Cancel: PSA; Future -     Cancel: PSA  Hyperlipidemia with target LDL less than 100- LDL goal achieved. Doing well on the statin  -     TSH; Future -     TSH  Chronic hypokalemia-  I will monitor his potassium level. -     Basic metabolic panel; Future -     Magnesium; Future -     Magnesium -  Basic metabolic panel  Insulin-requiring or dependent type II diabetes mellitus ([place])- His blood sugar is well controlled. -     Ambulatory referral to Ophthalmology -     Microalbumin / creatinine urine ratio; Future  Need for prophylactic vaccination and inoculation against varicella -     Zoster Vaccine Adjuvanted [facility]) injection; Inject 0.5 mLs into the muscle once for 1 dose.  Tobacco abuse -     Ambulatory Referral for Lung Cancer Scre   I am having Rickard P. Dalbert Batman start on Shingrix. I am also having him maintain his omeprazole, aspirin EC, Soliqua, amLODipine, atorvastatin, FreeStyle Libre 2 Reader, diclofenac Sodium, celecoxib, metoprolol tartrate, ondansetron, FreeStyle Libre 2 Sensor, ondansetron, Gvoke HypoPen 2-Pack, Accu-Chek Guide Me, levETIRAcetam, and carvedilol.  Meds ordered this encounter  Medications   Zoster Vaccine Adjuvanted [facility]) injection    Sig: Inject 0.5 mLs into the muscle once for 1 dose.    Dispense:  0.5 mL    Refill:  1     Follow-up: Return in about 6 months (around 02/22/2023).  Scarlette Calico, MD

## 2022-08-25 ENCOUNTER — Telehealth: Payer: Self-pay | Admitting: Internal Medicine

## 2022-08-25 NOTE — Telephone Encounter (Signed)
Pt forgot to tell PCP during his ov on 1.31.24 that he had a seizure in early January, couldn't remember the exact date, but wanted to know if we could arrange for [facility] assistance to visit the pt's home to hep with daily tasks.   Please call Pt to advise.  [phone]

## 2022-08-31 ENCOUNTER — Encounter: Payer: Self-pay | Admitting: Internal Medicine

## 2022-08-31 MED ORDER — SHINGRIX 50 MCG/0.5ML IM SUSR: 0.5000 mL | Freq: Once | INTRAMUSCULAR | 1 refills | Status: AC

## 2022-09-07 ENCOUNTER — Telehealth: Payer: Self-pay | Admitting: Internal Medicine

## 2022-09-07 DIAGNOSIS — E119 Type 2 diabetes mellitus without complications: Secondary | ICD-10-CM

## 2022-09-07 MED ORDER — FREESTYLE LIBRE 2 READER DEVI: 1.0000 | Freq: Every day | 5 refills | Status: DC

## 2022-09-07 NOTE — Telephone Encounter (Signed)
Patient states that his dogs chewed up his free style libre reader - he would like another one sent in to the [facility] at [facility] in [place], [place].  Patient's number:  [phone]

## 2022-09-21 ENCOUNTER — Telehealth: Payer: Self-pay | Admitting: Internal Medicine

## 2022-09-21 DIAGNOSIS — H3589 Other specified retinal disorders: Secondary | ICD-10-CM | POA: Diagnosis not present

## 2022-09-21 DIAGNOSIS — H2513 Age-related nuclear cataract, bilateral: Secondary | ICD-10-CM | POA: Diagnosis not present

## 2022-09-21 DIAGNOSIS — E119 Type 2 diabetes mellitus without complications: Secondary | ICD-10-CM

## 2022-09-21 LAB — HM DIABETES EYE EXAM

## 2022-09-21 NOTE — Telephone Encounter (Signed)
Minette Brine from [place] [place] called and asked for clarification on the instructions for Continuous Blood Gluc Receiver (FREESTYLE LIBRE 2 READER) [place]   Best callback number is [phone]

## 2022-09-28 MED ORDER — FREESTYLE LIBRE 2 READER DEVI: 5 refills | Status: DC

## 2022-09-28 NOTE — Telephone Encounter (Signed)
Tried [facility] and was left on ringing.

## 2022-09-28 NOTE — Telephone Encounter (Signed)
Updated the script and sent to [facility].Marland KitchenJohny Chess

## 2022-09-28 NOTE — Addendum Note (Signed)
Addended by: Earnstine Regal on: 09/28/2022 04:08 PM   Modules accepted: Orders

## 2022-10-05 ENCOUNTER — Telehealth: Payer: Self-pay | Admitting: Internal Medicine

## 2022-10-05 NOTE — Telephone Encounter (Signed)
Patient would like to know if he can get a recommendation for a dentist. When told he can use [employer] website, he declined due to being technologically challenged. He wants to know if Dr. Ronnald Ramp knows any trustworthy dentists in network.  Best callback number is [phone].

## 2022-10-12 NOTE — Telephone Encounter (Signed)
Called pt, LVM.   

## 2022-10-24 ENCOUNTER — Other Ambulatory Visit: Payer: Self-pay | Admitting: Internal Medicine

## 2022-10-24 DIAGNOSIS — G40909 Epilepsy, unspecified, not intractable, without status epilepticus: Secondary | ICD-10-CM

## 2022-10-25 DIAGNOSIS — M5136 Other intervertebral disc degeneration, lumbar region: Secondary | ICD-10-CM | POA: Diagnosis not present

## 2022-11-16 ENCOUNTER — Telehealth: Payer: Self-pay

## 2022-11-16 NOTE — Telephone Encounter (Signed)
Pt states he is having anal pain and is requesting a colonoscopy due to having issues with constipation and needing one done. He is requesting orders be placed with [facility] for the colonoscopy. Pt states he think he was told he needs to have one done every 6 mnth or so due to abnormal findings on his last one.  **Pt has asked that the nurse give him a call back with an update.

## 2022-11-17 ENCOUNTER — Other Ambulatory Visit: Payer: Self-pay | Admitting: Nurse Practitioner

## 2022-11-17 DIAGNOSIS — I8510 Secondary esophageal varices without bleeding: Secondary | ICD-10-CM | POA: Diagnosis not present

## 2022-11-17 DIAGNOSIS — K7469 Other cirrhosis of liver: Secondary | ICD-10-CM | POA: Diagnosis not present

## 2022-11-17 LAB — PSA: PSA: 0.1

## 2022-11-21 DIAGNOSIS — G894 Chronic pain syndrome: Secondary | ICD-10-CM | POA: Diagnosis not present

## 2022-11-21 DIAGNOSIS — Z79891 Long term (current) use of opiate analgesic: Secondary | ICD-10-CM | POA: Diagnosis not present

## 2022-11-21 DIAGNOSIS — I8510 Secondary esophageal varices without bleeding: Secondary | ICD-10-CM | POA: Diagnosis not present

## 2022-11-21 DIAGNOSIS — M5136 Other intervertebral disc degeneration, lumbar region: Secondary | ICD-10-CM | POA: Diagnosis not present

## 2022-11-21 DIAGNOSIS — Z5181 Encounter for therapeutic drug level monitoring: Secondary | ICD-10-CM | POA: Diagnosis not present

## 2022-11-21 DIAGNOSIS — K7469 Other cirrhosis of liver: Secondary | ICD-10-CM | POA: Diagnosis not present

## 2022-11-22 ENCOUNTER — Other Ambulatory Visit: Payer: Self-pay | Admitting: Internal Medicine

## 2022-11-22 DIAGNOSIS — I10 Essential (primary) hypertension: Secondary | ICD-10-CM

## 2022-11-25 NOTE — Telephone Encounter (Signed)
Pt has been informed that an OV would be needed.   He stated that he would check his schedule and give [place] a call back.

## 2022-12-06 ENCOUNTER — Other Ambulatory Visit: Payer: Self-pay | Admitting: Internal Medicine

## 2022-12-06 DIAGNOSIS — E119 Type 2 diabetes mellitus without complications: Secondary | ICD-10-CM

## 2022-12-06 DIAGNOSIS — Z794 Long term (current) use of insulin: Secondary | ICD-10-CM

## 2022-12-07 DIAGNOSIS — E119 Type 2 diabetes mellitus without complications: Secondary | ICD-10-CM | POA: Diagnosis not present

## 2022-12-07 DIAGNOSIS — F17210 Nicotine dependence, cigarettes, uncomplicated: Secondary | ICD-10-CM | POA: Diagnosis not present

## 2022-12-07 DIAGNOSIS — Z96653 Presence of artificial knee joint, bilateral: Secondary | ICD-10-CM | POA: Diagnosis not present

## 2022-12-07 DIAGNOSIS — Z8673 Personal history of transient ischemic attack (TIA), and cerebral infarction without residual deficits: Secondary | ICD-10-CM | POA: Diagnosis not present

## 2022-12-07 DIAGNOSIS — I10 Essential (primary) hypertension: Secondary | ICD-10-CM | POA: Diagnosis not present

## 2022-12-09 ENCOUNTER — Other Ambulatory Visit: Payer: 59

## 2022-12-13 ENCOUNTER — Ambulatory Visit (INDEPENDENT_AMBULATORY_CARE_PROVIDER_SITE_OTHER): Payer: 59 | Admitting: Primary Care

## 2022-12-13 ENCOUNTER — Other Ambulatory Visit: Payer: Self-pay

## 2022-12-13 ENCOUNTER — Encounter: Payer: Self-pay | Admitting: Primary Care

## 2022-12-13 DIAGNOSIS — Z87891 Personal history of nicotine dependence: Secondary | ICD-10-CM

## 2022-12-13 DIAGNOSIS — F17210 Nicotine dependence, cigarettes, uncomplicated: Secondary | ICD-10-CM | POA: Diagnosis not present

## 2022-12-13 DIAGNOSIS — F17200 Nicotine dependence, unspecified, uncomplicated: Secondary | ICD-10-CM | POA: Diagnosis not present

## 2022-12-13 NOTE — Progress Notes (Signed)
Virtual Visit via Telephone Note  I connected with Bradley Hull on 12/13/22 at  4:00 PM EDT by telephone and verified that I am speaking with the correct person using two identifiers.  Location: Patient: Home Provider: Office   I discussed the limitations, risks, security and privacy concerns of performing an evaluation and management service by telephone and the availability of in person appointments. I also discussed with the patient that there may be a patient responsible charge related to this service. The patient expressed understanding and agreed to proceed.   Shared Decision Making Visit Lung Cancer Screening Program [phone])   Eligibility: Age 72 y.o. Pack Years Smoking History Calculation 28 (# packs/per year x # years smoked) Recent History of coughing up blood  no Unexplained weight loss? no ( >Than 15 pounds within the last 6 months ) Prior History Lung / other cancer no (Diagnosis within the last 5 years already requiring surveillance chest CT Scans). Smoking Status Current Smoker Former Smokers: Years since quit: < 1 year  Quit Date: NA  Visit Components: Discussion included one or more decision making aids. yes Discussion included risk/benefits of screening. yes Discussion included potential follow up diagnostic testing for abnormal scans. yes Discussion included meaning and risk of over diagnosis. yes Discussion included meaning and risk of False Positives. yes Discussion included meaning of total radiation exposure. yes  Counseling Included: Importance of adherence to annual lung cancer LDCT screening. yes Impact of comorbidities on ability to participate in the program. yes Ability and willingness to under diagnostic treatment. yes  Smoking Cessation Counseling: Current Smokers:  Discussed importance of smoking cessation. yes Information about tobacco cessation classes and interventions provided to patient. yes Patient provided with "ticket" for LDCT  Scan. Na Symptomatic Patient. no  Counseling(Intermediate counseling: > three minutes) 99406 Diagnosis Code: Tobacco Use Z72.0 Asymptomatic Patient yes  Counseling (Intermediate counseling: > three minutes counseling) U0454 Former Smokers:  Discussed the importance of maintaining cigarette abstinence. yes Diagnosis Code: Personal History of Nicotine Dependence. U98.119 Information about tobacco cessation classes and interventions provided to patient. Yes Patient provided with "ticket" for LDCT Scan. Na Written Order for Lung Cancer Screening with LDCT placed in Epic. Yes (CT Chest Lung Cancer Screening Low Dose W/O CM) [id number] Z12.2-Screening of respiratory organs Z87.891-Personal history of nicotine dependence  I have spent 25 minutes of face to face/ virtual visit time with Bradley Hull discussing the risks and benefits of lung cancer screening. We viewed / discussed a power point together that explained in detail the above noted topics. We paused at intervals to allow for questions to be asked and answered to ensure understanding.We discussed that the single most powerful action that she can take to decrease her risk of developing lung cancer is to quit smoking. We discussed whether or not she is ready to commit to setting a quit date. We discussed options for tools to aid in quitting smoking including nicotine replacement therapy, non-nicotine medications, support groups, Quit Smart classes, and behavior modification. We discussed that often times setting smaller, more achievable goals, such as eliminating 1 cigarette a day for a week and then 2 cigarettes a day for a week can be helpful in slowly decreasing the number of cigarettes smoked. This allows for a sense of accomplishment as well as providing a clinical benefit. I provided her  with smoking cessation  information  with contact information for community resources, classes, free nicotine replacement therapy, and access to mobile apps, text  messaging, and on-line  smoking cessation help. I have also provided her  the office contact information in the event she needs to contact me, or the screening staff. We discussed the time and location of the scan, and that either Bradley Miyamoto RN, Bradley Lemon, RN  or I will call / send a letter with the results within 24-72 hours of receiving them. The patient verbalized understanding of all of  the above and had no further questions upon leaving the office. They have my contact information in the event they have any further questions.  I spent 3-5 minutes counseling on smoking cessation and the health risks of continued tobacco abuse.  I explained to the patient that there has been a high incidence of coronary artery disease noted on these exams. I explained that this is a non-gated exam therefore degree or severity cannot be determined. This patient is on statin therapy. I have asked the patient to follow-up with their PCP regarding any incidental finding of coronary artery disease and management with diet or medication as their PCP  feels is clinically indicated. The patient verbalized understanding of the above and had no further questions upon completion of the visit.  Hx prostate cancer in 2022, had radiation   Bradley Bayley, NP

## 2022-12-13 NOTE — Patient Instructions (Signed)
Thank you for participating in the [facility]. It was our pleasure to meet you today. We will call you with the results of your scan within the next few days. Your scan will be assigned a Lung RADS category score by the physicians reading the scans.  This Lung RADS score determines follow up scanning.  See below for description of categories, and follow up screening recommendations. We will be in touch to schedule your follow up screening annually or based on recommendations of our providers. We will fax a copy of your scan results to your Primary Care Physician, or the physician who referred you to the program, to ensure they have the results. Please call the office if you have any questions or concerns regarding your scanning experience or results.  Our office number is [phone]. Please speak with Denise Phelps, RN. , or  Denise Buckner RN, They are  our Lung Cancer Screening RN.'s If They are unavailable when you call, Please leave a message on the voice mail. We will return your call at our earliest convenience.This voice mail is monitored several times a day.  Remember, if your scan is normal, we will scan you annually as long as you continue to meet the criteria for the program. (Age 50-80, Current smoker or smoker who has quit within the last 15 years). If you are a smoker, remember, quitting is the single most powerful action that you can take to decrease your risk of lung cancer and other pulmonary, breathing related problems. We know quitting is hard, and we are here to help.  Please let us know if there is anything we can do to help you meet your goal of quitting. If you are a former smoker, congratulations. We are proud of you! Remain smoke free! Remember you can refer friends or family members through the number above.  We will screen them to make sure they meet criteria for the program. Thank you for helping us take better care of you by  participating in Lung Screening.  You can receive free nicotine replacement therapy ( patches, gum or mints) by calling 1-800-QUIT NOW. Please call so we can get you on the path to becoming  a non-smoker. I know it is hard, but you can do this!  Lung RADS Categories:  Lung RADS 1: no nodules or definitely non-concerning nodules.  Recommendation is for a repeat annual scan in 12 months.  Lung RADS 2:  nodules that are non-concerning in appearance and behavior with a very low likelihood of becoming an active cancer. Recommendation is for a repeat annual scan in 12 months.  Lung RADS 3: nodules that are probably non-concerning , includes nodules with a low likelihood of becoming an active cancer.  Recommendation is for a 6-month repeat screening scan. Often noted after an upper respiratory illness. We will be in touch to make sure you have no questions, and to schedule your 6-month scan.  Lung RADS 4 A: nodules with concerning findings, recommendation is most often for a follow up scan in 3 months or additional testing based on our provider's assessment of the scan. We will be in touch to make sure you have no questions and to schedule the recommended 3 month follow up scan.  Lung RADS 4 B:  indicates findings that are concerning. We will be in touch with you to schedule additional diagnostic testing based on our provider's  assessment of the scan.  Other options for assistance in smoking cessation (   As covered by your insurance benefits)  Hypnosis for smoking cessation  [facility] [employer] [phone]  Acupuncture for smoking cessation  [facility] [phone]   

## 2022-12-14 ENCOUNTER — Ambulatory Visit: Payer: 59 | Admitting: Emergency Medicine

## 2022-12-14 DIAGNOSIS — I10 Essential (primary) hypertension: Secondary | ICD-10-CM | POA: Diagnosis not present

## 2022-12-14 DIAGNOSIS — Z8673 Personal history of transient ischemic attack (TIA), and cerebral infarction without residual deficits: Secondary | ICD-10-CM | POA: Diagnosis not present

## 2022-12-14 DIAGNOSIS — R079 Chest pain, unspecified: Secondary | ICD-10-CM | POA: Diagnosis not present

## 2022-12-14 DIAGNOSIS — I517 Cardiomegaly: Secondary | ICD-10-CM | POA: Diagnosis not present

## 2022-12-14 DIAGNOSIS — R9431 Abnormal electrocardiogram [ECG] [EKG]: Secondary | ICD-10-CM | POA: Diagnosis not present

## 2022-12-14 DIAGNOSIS — E119 Type 2 diabetes mellitus without complications: Secondary | ICD-10-CM | POA: Diagnosis not present

## 2022-12-14 DIAGNOSIS — F17210 Nicotine dependence, cigarettes, uncomplicated: Secondary | ICD-10-CM | POA: Diagnosis not present

## 2022-12-14 DIAGNOSIS — R0602 Shortness of breath: Secondary | ICD-10-CM | POA: Diagnosis not present

## 2022-12-16 ENCOUNTER — Ambulatory Visit: Payer: 59 | Admitting: Cardiology

## 2022-12-16 ENCOUNTER — Encounter: Payer: Self-pay | Admitting: Cardiology

## 2022-12-16 VITALS — BP 149/83 | HR 78 | Ht 65.0 in | Wt 230.0 lb

## 2022-12-16 DIAGNOSIS — R0609 Other forms of dyspnea: Secondary | ICD-10-CM | POA: Diagnosis not present

## 2022-12-16 DIAGNOSIS — I10 Essential (primary) hypertension: Secondary | ICD-10-CM | POA: Diagnosis not present

## 2022-12-16 DIAGNOSIS — E785 Hyperlipidemia, unspecified: Secondary | ICD-10-CM | POA: Diagnosis not present

## 2022-12-16 DIAGNOSIS — E1169 Type 2 diabetes mellitus with other specified complication: Secondary | ICD-10-CM

## 2022-12-16 DIAGNOSIS — E782 Mixed hyperlipidemia: Secondary | ICD-10-CM | POA: Diagnosis not present

## 2022-12-16 NOTE — Progress Notes (Signed)
Patient referred by Bradley Grandchild, MD for abnormal EKG  Subjective:   Bradley Hull, male    DOB: 1951/02/14, 72 y.o.   MRN: [id number]   Chief Complaint  Patient presents with   New Patient (Initial Visit)    Second opinion   Long QT Syndrome     HPI  72 y.o. African American male with hypertension, type 2 DM, h/o stroke, tobacco dependence, h/o alcohol abuse, obesity, h/o prostate adenocarcinoma  It appears that patient had Rt acute ischemic stroke in 05/2021 fr which he was evaluated at [facility]. His symptoms then were primarily dizziness and rt eye decreased vision, both have since resolved. Workup then reportedly showed Rt ICA occlusion. He was referred to vascular surgery, details not available to me., He is upset that he was only recommended medications then.   Recently, he has not had any stroke.TIA symptoms. He has not seen Neurology. He does endorse exertional dyspnea in the last few weeks, but this has also correlated with some issue with inhaling dryer dust due to faulty connections at home, according to him. He denies any chest pain.,    Current Outpatient Medications:    amLODipine (NORVASC) 10 MG tablet, Take 1 tablet by mouth once daily, Disp: 90 tablet, Rfl: 0   aspirin EC 81 MG tablet, Take 81 mg by mouth daily. Swallow whole., Disp: , Rfl:    atorvastatin (LIPITOR) 80 MG tablet, Take 1 tablet (80 mg total) by mouth at bedtime., Disp: 90 tablet, Rfl: 1   Blood Glucose Monitoring Suppl (ACCU-CHEK GUIDE ME) w/Device KIT, USE AS DIRECTED, Disp: 1 kit, Rfl: 1   carvedilol (COREG) 6.25 MG tablet, TAKE 1 TABLET BY MOUTH TWICE DAILY WITH A MEAL, Disp: 180 tablet, Rfl: 0   celecoxib (CELEBREX) 200 MG capsule, Take 200 mg by mouth 2 (two) times daily., Disp: , Rfl:    Continuous Blood Gluc Receiver (FREESTYLE LIBRE 2 READER) DEVI, Use as directed to check blood sugars, Disp: 2 each, Rfl: 5   Continuous Blood Gluc Sensor (FREESTYLE LIBRE 2 SENSOR) MISC,  USE AS DIRECTED, Disp: 2 each, Rfl: 5   diclofenac Sodium (VOLTAREN) 1 % GEL, Apply 1 Application topically daily as needed (pain)., Disp: , Rfl:    Glucagon (GVOKE HYPOPEN 2-PACK) 1 MG/0.2ML SOAJ, Inject 1 Act into the skin daily as needed., Disp: 2 mL, Rfl: 5   levETIRAcetam (KEPPRA) 500 MG tablet, Take 1 tablet by mouth twice daily, Disp: 180 tablet, Rfl: 0   omeprazole (PRILOSEC) 40 MG capsule, Use Prilosec (omeprazole) 40 mg PO BID for 6 weeks to promote mucosal healing, then reduce to 40 mg daily and continue to titrate to lowest effective dose if abdominal symptoms resolved., Disp: 180 capsule, Rfl: 1   SOLIQUA 100-33 UNT-MCG/ML SOPN, INJECT 60 UNITS  SUBCUTANEOUSLY ONCE DAILY, Disp: 54 mL, Rfl: 0  Cardiovascular and other pertinent studies:  EKG 12/16/2022: Sinus rhythm 69 bpm  Left anterior fascicular block Cannot exclude old anteroseptal infarct Nonspecific T-abnormality   Recent labs: Nov 2021-March 2022: Glucose 124, BUN/Cr <5/0.75. EGFR >60. Na/K 144/3.7. T. Bili 1.3/ Rest of the CMP normal H/H 14/42. MCV 89. Platelets 232 HbA1C 6.0% Chol 131, TG 89, HDL 49, LDL 64 TSH 0.7    Review of Systems  Cardiovascular:  Positive for dyspnea on exertion. Negative for chest pain, leg swelling, palpitations and syncope.         Vitals:   12/16/22 1105  BP: (!) 149/83  Pulse:  78  SpO2: 95%     Body mass index is 38.27 kg/m. Filed Weights   12/16/22 1105  Weight: 230 lb (104.3 kg)     Objective:   Physical Exam Vitals and nursing note reviewed.  Constitutional:      General: He is not in acute distress. Neck:     Vascular: No JVD.  Cardiovascular:     Rate and Rhythm: Normal rate and regular rhythm.     Heart sounds: Normal heart sounds.  Pulmonary:     Effort: Pulmonary effort is normal.     Breath sounds: Normal breath sounds. No wheezing or rales.  Musculoskeletal:     Right lower leg: No edema.     Left lower leg: No edema.             Assessment & Recommendations:    72 y.o. African American male with hypertension, type 2 DM, h/o stroke, tobacco dependence, h/o alcohol abuse, obesity, h/o prostate adenocarcinoma  Physical exam is unremarkable for carotid bruit. I am not sure there is any intervention to be performed if has occluded Rt ICA, but intact Lt ICA. He has no stroke/TIA symptoms at this time. It would be reasonable to establish care with Neurology. Will defer the referral to PCP Dr. Yetta Hull. Continue medical management, including Aspirin, statin, blood pressure and lipid control. Check lipid panel. Given his recent exertional dyspnea, will check echocardiogram.    Elder Negus, MD Pager: [phone] Office: [phone]

## 2022-12-21 DIAGNOSIS — E782 Mixed hyperlipidemia: Secondary | ICD-10-CM | POA: Diagnosis not present

## 2022-12-21 DIAGNOSIS — M5136 Other intervertebral disc degeneration, lumbar region: Secondary | ICD-10-CM | POA: Diagnosis not present

## 2022-12-22 LAB — LIPID PANEL
Chol/HDL Ratio: 1.7 ratio (ref 0.0–5.0)
Cholesterol, Total: 78 mg/dL — ABNORMAL LOW (ref 100–199)
HDL: 47 mg/dL (ref >39)
LDL Chol Calc (NIH): 19 mg/dL (ref 0–99)
Triglycerides: 42 mg/dL (ref 0–149)
VLDL Cholesterol Cal: 12 mg/dL (ref 5–40)

## 2023-01-03 ENCOUNTER — Telehealth: Payer: Self-pay | Admitting: Internal Medicine

## 2023-01-03 ENCOUNTER — Other Ambulatory Visit: Payer: Self-pay | Admitting: Internal Medicine

## 2023-01-03 NOTE — Telephone Encounter (Signed)
Pt was informed that Rx was originally prescribed by Assunta Found, NP in 2023. Pt is adamant that he has never had a seizure disorder and that this medication is preventing him for starting a new job.   Please advise if pt needs to continue this medication.

## 2023-01-03 NOTE — Telephone Encounter (Signed)
Patient called very upset about why he is taking an anti-seizure medication. He would like a call back ASAP. He said it is affecting him being hired at a new job. Best callback is [phone].

## 2023-01-03 NOTE — Telephone Encounter (Signed)
Pt called wanting to know why is is on anti seizure medicine when never had a seizure and been on this medication is stopping him from getting his license.  Please advise pt want a call back from nurse.

## 2023-01-11 ENCOUNTER — Inpatient Hospital Stay: Admission: RE | Admit: 2023-01-11 | Payer: 59 | Source: Ambulatory Visit

## 2023-01-16 ENCOUNTER — Other Ambulatory Visit: Payer: 59

## 2023-01-16 ENCOUNTER — Other Ambulatory Visit: Payer: Self-pay | Admitting: Internal Medicine

## 2023-01-16 ENCOUNTER — Telehealth: Payer: Self-pay | Admitting: Internal Medicine

## 2023-01-16 DIAGNOSIS — I10 Essential (primary) hypertension: Secondary | ICD-10-CM

## 2023-01-16 NOTE — Telephone Encounter (Signed)
He is due for an A1C test 

## 2023-01-16 NOTE — Telephone Encounter (Signed)
Prescription Request  01/16/2023  LOV: 08/24/2022  What is the name of the medication or equipment?  amLODipine (NORVASC) 10 MG tablet  Have you contacted your pharmacy to request a refill? Yes   Which pharmacy would you like this sent to?  [facility] [address], [place] - [address] [place] [address] Phone: [phone] Fax: [phone]    Patient notified that their request is being sent to the clinical staff for review and that they should receive a response within 2 business days.   Please advise at Mobile [phone] (mobile)

## 2023-01-17 ENCOUNTER — Other Ambulatory Visit: Payer: Self-pay | Admitting: Internal Medicine

## 2023-01-17 DIAGNOSIS — I10 Essential (primary) hypertension: Secondary | ICD-10-CM

## 2023-01-17 NOTE — Telephone Encounter (Signed)
Called pt, LVM.   

## 2023-01-20 ENCOUNTER — Other Ambulatory Visit: Payer: Self-pay | Admitting: Internal Medicine

## 2023-01-20 DIAGNOSIS — I10 Essential (primary) hypertension: Secondary | ICD-10-CM

## 2023-01-20 NOTE — Telephone Encounter (Signed)
Patient has been scheduled for soonest availability of 02/13/23. He would like to know if he can get enough Amlodipine to last him until then. Best callback is [phone].

## 2023-01-22 ENCOUNTER — Other Ambulatory Visit: Payer: Self-pay | Admitting: Internal Medicine

## 2023-01-22 DIAGNOSIS — E119 Type 2 diabetes mellitus without complications: Secondary | ICD-10-CM

## 2023-02-07 ENCOUNTER — Other Ambulatory Visit: Payer: 59

## 2023-02-08 ENCOUNTER — Ambulatory Visit
Admission: RE | Admit: 2023-02-08 | Discharge: 2023-02-08 | Disposition: A | Discharge status: Home / Self Care | Payer: 59 | Source: Ambulatory Visit | Attending: Nurse Practitioner | Admitting: Nurse Practitioner

## 2023-02-08 DIAGNOSIS — K7469 Other cirrhosis of liver: Secondary | ICD-10-CM

## 2023-02-13 ENCOUNTER — Ambulatory Visit (INDEPENDENT_AMBULATORY_CARE_PROVIDER_SITE_OTHER): Payer: 59

## 2023-02-13 ENCOUNTER — Ambulatory Visit: Payer: 59 | Admitting: Internal Medicine

## 2023-02-13 VITALS — Ht 65.0 in | Wt 228.0 lb

## 2023-02-13 DIAGNOSIS — Z0000 Encounter for general adult medical examination without abnormal findings: Secondary | ICD-10-CM | POA: Diagnosis not present

## 2023-02-13 NOTE — Patient Instructions (Addendum)
Bradley Hull , Thank you for taking time to come for your Medicare Wellness Visit. I appreciate your ongoing commitment to your health goals. Please review the following plan we discussed and let me know if I can assist you in the future.   These are the goals we discussed:  Goals      MY HEALTHCARE GOAL FOR 2024 IS TO COOK AND EAT HEALTHIER. POSSIBLY TAKE SOME COOKING CLASSES.        This is a list of the screening recommended for you and due dates:  Health Maintenance  Topic Date Due   Zoster (Shingles) Vaccine (1 of 2) Never done   Screening for Lung Cancer  Never done   Yearly kidney health urinalysis for diabetes  07/27/2022   Hemoglobin A1C  12/27/2022   Flu Shot  02/23/2023   Complete foot exam   04/13/2023   Yearly kidney function blood test for diabetes  07/11/2023   Colon Cancer Screening  09/10/2023   Eye exam for diabetics  09/22/2023   Medicare Annual Wellness Visit  02/13/2024   DTaP/Tdap/Td vaccine (2 - Td or Tdap) 07/03/2030   Pneumonia Vaccine  Completed   Hepatitis C Screening  Completed   HPV Vaccine  Aged Out   COVID-19 Vaccine  Discontinued    Advanced directives: NO  Conditions/risks identified: YES  Next appointment: It was nice speaking with you today!  Please follow up in one year for your annual wellness visit via telephone call with Nurse Percell Miller on 02/14/2024 at 3:30 p.m.  If you need to cancel or reschedule please call [phone].  Preventive Care 70 Years and Older, Male  Preventive care refers to lifestyle choices and visits with your health care provider that can promote health and wellness. What does preventive care include? A yearly physical exam. This is also called an annual well check. Dental exams once or twice a year. Routine eye exams. Ask your health care provider how often you should have your eyes checked. Personal lifestyle choices, including: Daily care of your teeth and gums. Regular physical activity. Eating a healthy  diet. Avoiding tobacco and drug use. Limiting alcohol use. Practicing safe sex. Taking low doses of aspirin every day. Taking vitamin and mineral supplements as recommended by your health care provider. What happens during an annual well check? The services and screenings done by your health care provider during your annual well check will depend on your age, overall health, lifestyle risk factors, and family history of disease. Counseling  Your health care provider may ask you questions about your: Alcohol use. Tobacco use. Drug use. Emotional well-being. Home and relationship well-being. Sexual activity. Eating habits. History of falls. Memory and ability to understand (cognition). Work and work Astronomer. Screening  You may have the following tests or measurements: Height, weight, and BMI. Blood pressure. Lipid and cholesterol levels. These may be checked every 5 years, or more frequently if you are over 11 years old. Skin check. Lung cancer screening. You may have this screening every year starting at age 69 if you have a 30-pack-year history of smoking and currently smoke or have quit within the past 15 years. Fecal occult blood test (FOBT) of the stool. You may have this test every year starting at age 85. Flexible sigmoidoscopy or colonoscopy. You may have a sigmoidoscopy every 5 years or a colonoscopy every 10 years starting at age 74. Prostate cancer screening. Recommendations will vary depending on your family history and other risks. Hepatitis C blood  test. Hepatitis B blood test. Sexually transmitted disease (STD) testing. Diabetes screening. This is done by checking your blood sugar (glucose) after you have not eaten for a while (fasting). You may have this done every 1-3 years. Abdominal aortic aneurysm (AAA) screening. You may need this if you are a current or former smoker. Osteoporosis. You may be screened starting at age 60 if you are at high risk. Talk with  your health care provider about your test results, treatment options, and if necessary, the need for more tests. Vaccines  Your health care provider may recommend certain vaccines, such as: Influenza vaccine. This is recommended every year. Tetanus, diphtheria, and acellular pertussis (Tdap, Td) vaccine. You may need a Td booster every 10 years. Zoster vaccine. You may need this after age 83. Pneumococcal 13-valent conjugate (PCV13) vaccine. One dose is recommended after age 69. Pneumococcal polysaccharide (PPSV23) vaccine. One dose is recommended after age 76. Talk to your health care provider about which screenings and vaccines you need and how often you need them. This information is not intended to replace advice given to you by your health care provider. Make sure you discuss any questions you have with your health care provider. Document Released: 08/07/2015 Document Revised: 03/30/2016 Document Reviewed: 05/12/2015 Elsevier Interactive Patient Education  2017 [employer].  Fall Prevention in the Home Falls can cause injuries. They can happen to people of all ages. There are many things you can do to make your home safe and to help prevent falls. What can I do on the outside of my home? Regularly fix the edges of walkways and driveways and fix any cracks. Remove anything that might make you trip as you walk through a door, such as a raised step or threshold. Trim any bushes or trees on the path to your home. Use bright outdoor lighting. Clear any walking paths of anything that might make someone trip, such as rocks or tools. Regularly check to see if handrails are loose or broken. Make sure that both sides of any steps have handrails. Any raised decks and porches should have guardrails on the edges. Have any leaves, snow, or ice cleared regularly. Use sand or salt on walking paths during winter. Clean up any spills in your garage right away. This includes oil or grease spills. What  can I do in the bathroom? Use night lights. Install grab bars by the toilet and in the tub and shower. Do not use towel bars as grab bars. Use non-skid mats or decals in the tub or shower. If you need to sit down in the shower, use a plastic, non-slip stool. Keep the floor dry. Clean up any water that spills on the floor as soon as it happens. Remove soap buildup in the tub or shower regularly. Attach bath mats securely with double-sided non-slip rug tape. Do not have throw rugs and other things on the floor that can make you trip. What can I do in the bedroom? Use night lights. Make sure that you have a light by your bed that is easy to reach. Do not use any sheets or blankets that are too big for your bed. They should not hang down onto the floor. Have a firm chair that has side arms. You can use this for support while you get dressed. Do not have throw rugs and other things on the floor that can make you trip. What can I do in the kitchen? Clean up any spills right away. Avoid walking on wet  floors. Keep items that you use a lot in easy-to-reach places. If you need to reach something above you, use a strong step stool that has a grab bar. Keep electrical cords out of the way. Do not use floor polish or wax that makes floors slippery. If you must use wax, use non-skid floor wax. Do not have throw rugs and other things on the floor that can make you trip. What can I do with my stairs? Do not leave any items on the stairs. Make sure that there are handrails on both sides of the stairs and use them. Fix handrails that are broken or loose. Make sure that handrails are as long as the stairways. Check any carpeting to make sure that it is firmly attached to the stairs. Fix any carpet that is loose or worn. Avoid having throw rugs at the top or bottom of the stairs. If you do have throw rugs, attach them to the floor with carpet tape. Make sure that you have a light switch at the top of the  stairs and the bottom of the stairs. If you do not have them, ask someone to add them for you. What else can I do to help prevent falls? Wear shoes that: Do not have high heels. Have rubber bottoms. Are comfortable and fit you well. Are closed at the toe. Do not wear sandals. If you use a stepladder: Make sure that it is fully opened. Do not climb a closed stepladder. Make sure that both sides of the stepladder are locked into place. Ask someone to hold it for you, if possible. Clearly mark and make sure that you can see: Any grab bars or handrails. First and last steps. Where the edge of each step is. Use tools that help you move around (mobility aids) if they are needed. These include: Canes. Walkers. Scooters. Crutches. Turn on the lights when you go into a dark area. Replace any light bulbs as soon as they burn out. Set up your furniture so you have a clear path. Avoid moving your furniture around. If any of your floors are uneven, fix them. If there are any pets around you, be aware of where they are. Review your medicines with your doctor. Some medicines can make you feel dizzy. This can increase your chance of falling. Ask your doctor what other things that you can do to help prevent falls. This information is not intended to replace advice given to you by your health care provider. Make sure you discuss any questions you have with your health care provider. Document Released: 05/07/2009 Document Revised: 12/17/2015 Document Reviewed: 08/15/2014 Elsevier Interactive Patient Education  2017 [employer].

## 2023-02-13 NOTE — Progress Notes (Signed)
Subjective:   Bradley Hull is a 72 y.o. male who presents for an Initial Medicare Annual Wellness Visit.  Visit Complete: Virtual  I connected with  Bradley Hull on 02/13/23 by a audio enabled telemedicine application and verified that I am speaking with the correct person using two identifiers.  Patient Location: Home  Provider Location: Office/Clinic  I discussed the limitations of evaluation and management by telemedicine. The patient expressed understanding and agreed to proceed.  Per patient no change in vitals since last visit; unable to obtain new vitals due to this being a telehealth visit. Patient was unable to self-report vital signs via telehealth due to a lack of equipment at home.   Review of Systems     Cardiac Risk Factors include: advanced age (>74men, >80 women);diabetes mellitus;dyslipidemia;hypertension;male gender;obesity (BMI >30kg/m2);smoking/ tobacco exposure;sedentary lifestyle     Objective:    Today's Vitals   02/13/23 1531 02/13/23 1544  Weight: 228 lb (103.4 kg)   Height: 5\' 5"  (1.651 m)   PainSc: 8  8   PainLoc: Knee    Body mass index is 37.94 kg/m.     02/13/2023    4:00 PM 07/11/2022    1:03 AM 07/08/2022    9:09 AM 06/27/2022    1:39 PM 06/16/2022    5:18 AM 01/26/2022    4:25 PM 03/05/2021    3:19 PM  Advanced Directives  Does Patient Have a Medical Advance Directive? No No No No No No No  Would patient like information on creating a medical advance directive? No - Patient declined Yes (ED - Information included in AVS) No - Patient declined No - Patient declined No - Patient declined  No - Patient declined    Current Medications (verified) Outpatient Encounter Medications as of 02/13/2023  Medication Sig   amLODipine (NORVASC) 10 MG tablet Take 1 tablet by mouth once daily   aspirin EC 81 MG tablet Take 81 mg by mouth daily. Swallow whole.   atorvastatin (LIPITOR) 80 MG tablet Take 1 tablet (80 mg total) by mouth at bedtime.    Blood Glucose Monitoring Suppl (ACCU-CHEK GUIDE ME) w/Device KIT USE AS DIRECTED   carvedilol (COREG) 6.25 MG tablet TAKE 1 TABLET BY MOUTH TWICE DAILY WITH A MEAL   Continuous Blood Gluc Receiver (FREESTYLE LIBRE 2 READER) DEVI Use as directed to check blood sugars   Continuous Glucose Sensor (FREESTYLE LIBRE 2 SENSOR) MISC USE AS DIRECTED   diclofenac Sodium (VOLTAREN) 1 % GEL Apply 1 Application topically daily as needed (pain).   Glucagon (GVOKE HYPOPEN 2-PACK) 1 MG/0.2ML SOAJ Inject 1 Act into the skin daily as needed. (Patient not taking: Reported on 12/16/2022)   levETIRAcetam (KEPPRA) 500 MG tablet Take 1 tablet by mouth twice daily (Patient not taking: Reported on 02/13/2023)   omeprazole (PRILOSEC) 40 MG capsule Use Prilosec (omeprazole) 40 mg PO BID for 6 weeks to promote mucosal healing, then reduce to 40 mg daily and continue to titrate to lowest effective dose if abdominal symptoms resolved. (Patient not taking: Reported on 12/16/2022)   No facility-administered encounter medications on file as of 02/13/2023.    Allergies (verified) Dilaudid [hydromorphone hcl] and Hydromorphone   History: Past Medical History:  Diagnosis Date   Arthritis    Benign prostatic hyperplasia    Cirrhosis (HCC)    Colon polyps    Complication of anesthesia    bp droppped and heart rate sped up after 09-09-2020 endoscopy /colonscopy   DM type 2 (diabetes  mellitus, type 2) (HCC)    Hepatitis C    took treatment for 2018 issue resolved per pt   History of kidney stones    Hypertension    Nicotine addiction    Obesity    Osteoarthritis of both knees    Prostate cancer (HCC)    Renal lithiasis    Stroke [facility])    Past Surgical History:  Procedure Laterality Date   COLONOSCOPY  09/09/2020   [facility]   CYSTOSCOPY  11/17/2020   Procedure: CYSTOSCOPY FLEXIBLE;  Surgeon: Bjorn Pippin, MD;  Location: [facility];  Service: Urology;;   CYSTOSCOPY/URETEROSCOPY/HOLMIUM LASER/STENT  PLACEMENT Right 07/08/2022   Procedure: CYSTOSCOPY RIGHT RETROGRADE PYELOGRAM /URETEROSCOPY/HOLMIUM LASER/STENT PLACEMENT;  Surgeon: Bjorn Pippin, MD;  Location: [facility];  Service: Urology;  Laterality: Right;  1 HR   ESOPHAGOGASTRODUODENOSCOPY  2016   KIDNEY STONE SURGERY Right yrs ago   LITHOTRIPSY Left 1986   MULTIPLE EXTRACTIONS WITH ALVEOLOPLASTY N/A 09/09/2016   Procedure: MULTIPLE EXTRACTION WITH ALVEOLOPLASTY - one, two, six, seven, nine, ten, eleven, sixteen, seventeen, twenty, twenty-one, twenty-two, twenty-four, twenty-five, twenty-six, twenty-seven, twenty-eight, twenty-nine, thirty-one, thirty-two; alveolplasty; removal bilateral mandible turi; removal cyst right mandible;  bone graft;  Surgeon: Ocie Doyne, DDS;  Location: [facility];  Service: Oral S   PILONIDAL CYST EXCISION  yrs ago   from spine   SPACE OAR INSTILLATION N/A 11/17/2020   Procedure: SPACE OAR INSTILLATION FIDUCIAL MARKER;  Surgeon: Bjorn Pippin, MD;  Location: [facility];  Service: Urology;  Laterality: N/A;  prostate   TOTAL KNEE ARTHROPLASTY Bilateral 03/2020   UPPER GASTROINTESTINAL ENDOSCOPY  08/30/2020   [facility]    WISDOM TOOTH EXTRACTION     x4 removed   Family History  Problem Relation Age of Onset   Lung cancer Mother        mets   Diabetes Mother    Breast cancer Mother    Diabetes Father    Hyperlipidemia Father    Colon cancer Father    Diabetes Sister    Hepatitis Brother    Schizophrenia Brother    Diabetes Sister    Diabetes Sister    Diabetes Brother    Diabetes Brother    Diabetes Maternal Grandmother    Esophageal cancer Neg Hx    Rectal cancer Neg Hx    Stomach cancer Neg Hx    Social History   Socioeconomic History   Marital status: Single    Spouse name: Not on file   Number of children: 2   Years of education: Not on file   Highest education level: Not on file  Occupational History   Occupation: retired  Tobacco Use   Smoking status: Every Day    Current  packs/day: 0.50    Average packs/day: 0.5 packs/day for 51.0 years (25.5 ttl pk-yrs)    Types: Cigarettes    Passive exposure: Current   Smokeless tobacco: Never   Tobacco comments:    Pt smokes 1 pack every other day  Vaping Use   Vaping status: Never Used  Substance and Sexual Activity   Alcohol use: Yes    Comment: occ   Drug use: Never   Sexual activity: Yes  Other Topics Concern   Not on file  Social History Narrative   Not on file   Social Determinants of Health   Financial Resource Strain: Low Risk  (02/13/2023)   Overall Financial Resource Strain (CARDIA)    Difficulty of Paying Living Expenses: Not hard at  all  Food Insecurity: No Food Insecurity (02/13/2023)   Hunger Vital Sign    Worried About Running Out of Food in the Last Year: Never true    Ran Out of Food in the Last Year: Never true  Transportation Needs: No Transportation Needs (02/13/2023)   PRAPARE - Administrator, Civil Service (Medical): No    Lack of Transportation (Non-Medical): No  Physical Activity: Insufficiently Active (02/13/2023)   Exercise Vital Sign    Days of Exercise per Week: 2 days    Minutes of Exercise per Session: 30 min  Stress: No Stress Concern Present (02/13/2023)   [employer] of Occupational Health - Occupational Stress Questionnaire    Feeling of Stress : Only a little  Social Connections: Moderately Isolated (02/13/2023)   Social Connection and Isolation Panel [NHANES]    Frequency of Communication with Friends and Family: Three times a week    Frequency of Social Gatherings with Friends and Family: Three times a week    Attends Religious Services: 1 to 4 times per year    Active Member of Clubs or Organizations: No    Attends Banker Meetings: Never    Marital Status: Never married    Tobacco Counseling Ready to quit: Not Answered Counseling given: Not Answered Tobacco comments: Pt smokes 1 pack every other day   Clinical  Intake:  Pre-visit preparation completed: Yes  Pain : 0-10 Pain Score: 8  Pain Type: Chronic pain Pain Location: Knee Pain Orientation: Right, Left     BMI - recorded: 37.94 Nutritional Status: BMI > 30  Obese Nutritional Risks: None Diabetes: Yes CBG done?: No Did pt. bring in CBG monitor from home?: No  How often do you need to have someone help you when you read instructions, pamphlets, or other written materials from your doctor or pharmacy?: 1 - Never What is the last grade level you completed in school?: HSG  Interpreter Needed?: No  Comments: HSG Information entered by :: Wenda Vanschaick N. Joi Leyva, LPN.   Activities of Daily Living    02/13/2023    3:48 PM 06/27/2022    1:40 PM  In your present state of health, do you have any difficulty performing the following activities:  Hearing? 0   Vision? 0   Difficulty concentrating or making decisions? 0   Walking or climbing stairs? 0   Dressing or bathing? 0   Doing errands, shopping? 0 0  Preparing Food and eating ? N   Using the Toilet? N   In the past six months, have you accidently leaked urine? N   Do you have problems with loss of bowel control? N   Managing your Medications? N   Managing your Finances? N   Housekeeping or managing your Housekeeping? N     Patient Care Team: [name], MD as PCP - General (Internal Medicine) Bjorn Pippin, MD as Attending Physician (Urology) Ewing Schlein, MD as Referring Physician (Pain Medicine) Margaretmary Dys, MD as Consulting Physician (Radiation Oncology) Axel Filler, Larna Daughters, NP as Nurse Practitioner (Hematology and Oncology) Maryclare Labrador, RN as Registered Nurse [facility] as Consulting Physician (Ophthalmology)  Indicate any recent Medical Services you may have received from other than [facility] providers in the past year (date may be approximate).     Assessment:   This is a routine wellness examination for [facility].  Hearing/Vision  screen Hearing Screening - Comments:: Denies hearing difficulties   Vision Screening - Comments:: Wears rx glasses -  up to date with routine eye exams with [facility]; eyeglasses were done at [facility].   Dietary issues and exercise activities discussed:     Goals Addressed             This Visit's Progress    MY HEALTHCARE GOAL FOR 2024 IS TO COOK AND EAT HEALTHIER. POSSIBLY TAKE SOME COOKING CLASSES.        Depression Screen    02/13/2023    3:59 PM 04/12/2022   10:42 AM 01/27/2022    8:30 AM 01/26/2022   12:33 PM 05/17/2021    2:25 PM 07/03/2020    7:59 AM 05/29/2020    1:32 PM  PHQ 2/9 Scores  PHQ - 2 Score 0 0   0 0 0  PHQ- 9 Score 0 0    0      Information is confidential and restricted. Go to Review Flowsheets to unlock data.    Fall Risk    02/13/2023    3:46 PM 04/12/2022   10:42 AM 07/27/2021    9:26 AM 07/03/2020    7:59 AM  Fall Risk   Falls in the past year? 0 0 0 0  Number falls in past yr: 0 0  0  Injury with Fall? 0 0  0  Risk for fall due to : No Fall Risks No Fall Risks  No Fall Risks  Follow up Falls prevention discussed Falls evaluation completed      MEDICARE RISK AT HOME:  Medicare Risk at Home - 02/13/23 1546     Any stairs in or around the home? Yes    If so, are there any without handrails? No    Home free of loose throw rugs in walkways, pet beds, electrical cords, etc? Yes    Adequate lighting in your home to reduce risk of falls? Yes    Life alert? No    Use of a cane, walker or w/c? No    Grab bars in the bathroom? Yes    Shower chair or bench in shower? No    Elevated toilet seat or a handicapped toilet? No             TIMED UP AND GO:  Was the test performed? No    Cognitive Function:        02/13/2023    3:48 PM  6CIT Screen  What Year? 0 points  What month? 0 points  What time? 0 points  Count back from 20 0 points  Months in reverse 0 points  Repeat phrase 0 points  Total Score 0 points     Immunizations Immunization History  Administered Date(s) Administered   Fluad Quad(high Dose 65+) 04/12/2022   Hepatitis A, Adult 05/13/2015   Influenza,inj,Quad PF,6+ Mos 05/13/2015   Influenza-Unspecified 07/26/2015, 04/28/2020, 04/08/2021   PFIZER(Purple Top)SARS-COV-2 Vaccination 08/19/2019, 09/09/2019, 06/12/2020   Pfizer Covid-19 Vaccine Bivalent Booster 23yrs & up 04/28/2021   Pneumococcal Conjugate-13 07/03/2020   Pneumococcal Polysaccharide-23 07/27/2021   Tdap 07/03/2020    TDAP status: Up to date  Flu Vaccine status: Up to date  Pneumococcal vaccine status: Up to date  Covid-19 vaccine status: Completed vaccines  Qualifies for Shingles Vaccine? Yes   Zostavax completed No   Shingrix Completed?: No.    Education has been provided regarding the importance of this vaccine. Patient has been advised to call insurance company to determine out of pocket expense if they have not yet received this vaccine. Advised may also receive vaccine  at local pharmacy or Health Dept. Verbalized acceptance and understanding.  Screening Tests Health Maintenance  Topic Date Due   Zoster Vaccines- Shingrix (1 of 2) Never done   Lung Cancer Screening  Never done   Diabetic kidney evaluation - Urine ACR  07/27/2022   HEMOGLOBIN A1C  12/27/2022   INFLUENZA VACCINE  02/23/2023   FOOT EXAM  04/13/2023   Diabetic kidney evaluation - eGFR measurement  07/11/2023   Colonoscopy  09/10/2023   OPHTHALMOLOGY EXAM  09/22/2023   Medicare Annual Wellness (AWV)  02/13/2024   DTaP/Tdap/Td (2 - Td or Tdap) 07/03/2030   Pneumonia Vaccine 69+ Years old  Completed   Hepatitis C Screening  Completed   HPV VACCINES  Aged Out   COVID-19 Vaccine  Discontinued    Health Maintenance  Health Maintenance Due  Topic Date Due   Zoster Vaccines- Shingrix (1 of 2) Never done   Lung Cancer Screening  Never done   Diabetic kidney evaluation - Urine ACR  07/27/2022   HEMOGLOBIN A1C  12/27/2022     Colorectal cancer screening: Type of screening: Colonoscopy. Completed 09/09/2020. Repeat every 3 years  Lung Cancer Screening: (Low Dose CT Chest recommended if Age 41-80 years, 20 pack-year currently smoking OR have quit w/in 15years.) does qualify.   Lung Cancer Screening Referral: scheduled for 02/14/2023  Additional Screening:  Hepatitis C Screening: does qualify; Completed 12/31/2020  Vision Screening: Recommended annual ophthalmology exams for early detection of glaucoma and other disorders of the eye. Is the patient up to date with their annual eye exam?  Yes  Who is the provider or what is the name of the office in which the patient attends annual eye exams? [facility] If pt is not established with a provider, would they like to be referred to a provider to establish care? No .   Dental Screening: Recommended annual dental exams for proper oral hygiene  Nutrition Risk Assessment:  Has the patient had any N/V/D within the last 2 months?  No  Does the patient have any non-healing wounds?  No  Has the patient had any unintentional weight loss or weight gain?  No   Diabetes:  Is the patient diabetic?  Yes  If diabetic, was a CBG obtained today?  No  Did the patient bring in their glucometer from home?  No  How often do you monitor your CBG's? Freestyle Libre Continuous Monitor.   Financial Strains and Diabetes Management:  Are you having any financial strains with the device, your supplies or your medication? No .  Does the patient want to be seen by Chronic Care Management for management of their diabetes?  No  Would the patient like to be referred to a Nutritionist or for Diabetic Management?  No   Diabetic Exams:  Diabetic Eye Exam: Completed 09/21/2022 Diabetic Foot Exam: Completed 04/12/2022   Community Resource Referral / Chronic Care Management: CRR required this visit?  No   CCM required this visit?  No    Plan:     I have  personally reviewed and noted the following in the patient's chart:   Medical and social history Use of alcohol, tobacco or illicit drugs  Current medications and supplements including opioid prescriptions. Patient is not currently taking opioid prescriptions. Functional ability and status Nutritional status Physical activity Advanced directives List of other physicians Hospitalizations, surgeries, and ER visits in previous 12 months Vitals Screenings to include cognitive, depression, and falls Referrals and appointments  In addition, I  have reviewed and discussed with patient certain preventive protocols, quality metrics, and best practice recommendations. A written personalized care plan for preventive services as well as general preventive health recommendations were provided to patient.     Mickeal Needy, LPN   1/61/0960   After Visit Summary: (Mail) Due to this being a telephonic visit, the after visit summary with patients personalized plan was offered to patient via mail   Nurse Notes: Normal cognitive status assessed by direct observation via telephone conversation by this Nurse Health Advisor. No abnormalities found.

## 2023-02-14 ENCOUNTER — Ambulatory Visit: Payer: 59 | Admitting: Internal Medicine

## 2023-02-14 ENCOUNTER — Ambulatory Visit
Admission: RE | Admit: 2023-02-14 | Discharge: 2023-02-14 | Disposition: A | Discharge status: Home / Self Care | Payer: 59 | Source: Ambulatory Visit | Attending: Acute Care | Admitting: Acute Care

## 2023-02-14 DIAGNOSIS — F17210 Nicotine dependence, cigarettes, uncomplicated: Secondary | ICD-10-CM

## 2023-02-14 DIAGNOSIS — Z87891 Personal history of nicotine dependence: Secondary | ICD-10-CM

## 2023-02-20 ENCOUNTER — Other Ambulatory Visit: Payer: Self-pay | Admitting: Internal Medicine

## 2023-02-20 DIAGNOSIS — I10 Essential (primary) hypertension: Secondary | ICD-10-CM

## 2023-02-22 ENCOUNTER — Other Ambulatory Visit: Payer: Self-pay

## 2023-02-22 DIAGNOSIS — F17210 Nicotine dependence, cigarettes, uncomplicated: Secondary | ICD-10-CM

## 2023-02-22 DIAGNOSIS — F17200 Nicotine dependence, unspecified, uncomplicated: Secondary | ICD-10-CM

## 2023-02-22 DIAGNOSIS — Z87891 Personal history of nicotine dependence: Secondary | ICD-10-CM

## 2023-02-23 ENCOUNTER — Other Ambulatory Visit: Payer: Self-pay | Admitting: Internal Medicine

## 2023-02-23 DIAGNOSIS — I10 Essential (primary) hypertension: Secondary | ICD-10-CM

## 2023-02-27 ENCOUNTER — Other Ambulatory Visit: Payer: 59

## 2023-03-02 ENCOUNTER — Ambulatory Visit: Payer: 59

## 2023-03-02 DIAGNOSIS — R0609 Other forms of dyspnea: Secondary | ICD-10-CM

## 2023-03-06 ENCOUNTER — Other Ambulatory Visit: Payer: Self-pay | Admitting: Internal Medicine

## 2023-03-06 DIAGNOSIS — I10 Essential (primary) hypertension: Secondary | ICD-10-CM

## 2023-03-10 ENCOUNTER — Other Ambulatory Visit: Payer: Self-pay | Admitting: Internal Medicine

## 2023-03-10 DIAGNOSIS — E119 Type 2 diabetes mellitus without complications: Secondary | ICD-10-CM

## 2023-03-16 ENCOUNTER — Ambulatory Visit: Payer: 59 | Admitting: Cardiology

## 2023-03-16 ENCOUNTER — Encounter: Payer: Self-pay | Admitting: Cardiology

## 2023-03-16 VITALS — BP 137/72 | HR 64 | Resp 17 | Ht 65.0 in | Wt 230.0 lb

## 2023-03-16 DIAGNOSIS — E782 Mixed hyperlipidemia: Secondary | ICD-10-CM

## 2023-03-16 DIAGNOSIS — R0609 Other forms of dyspnea: Secondary | ICD-10-CM

## 2023-03-16 DIAGNOSIS — I10 Essential (primary) hypertension: Secondary | ICD-10-CM

## 2023-03-16 MED ORDER — FUROSEMIDE 20 MG PO TABS: 20.0000 mg | ORAL_TABLET | Freq: Every day | ORAL | 1 refills | Status: DC

## 2023-03-16 NOTE — Progress Notes (Signed)
Patient referred by [name], MD for abnormal EKG  Subjective:   Bradley Hull, male    DOB: [birth year], 72 y.o.   MRN: [id number]   Chief Complaint  Patient presents with   Hypertension   Follow-up     HPI  72 y.o. African American male with hypertension, type 2 DM, h/o stroke, tobacco dependence, h/o alcohol abuse, obesity, h/o prostate adenocarcinoma  Patient dneies any chest pain. He has noticed wheezing and mild leg edema recently. Reviewed recent test results with the patient, details below.   Initial consultation visit 11/2022: It appears that patient had Rt acute ischemic stroke in 05/2021 fr which he was evaluated at [facility]. His symptoms then were primarily dizziness and rt eye decreased vision, both have since resolved. Workup then reportedly showed Rt ICA occlusion. He was referred to vascular surgery, details not available to me., He is upset that he was only recommended medications then.   Recently, he has not had any stroke.TIA symptoms. He has not seen Neurology. He does endorse exertional dyspnea in the last few weeks, but this has also correlated with some issue with inhaling dryer dust due to faulty connections at home, according to him. He denies any chest pain.,    Current Outpatient Medications:    amLODipine (NORVASC) 10 MG tablet, Take 1 tablet by mouth once daily, Disp: 90 tablet, Rfl: 0   aspirin EC 81 MG tablet, Take 81 mg by mouth daily. Swallow whole., Disp: , Rfl:    atorvastatin (LIPITOR) 80 MG tablet, Take 1 tablet (80 mg total) by mouth at bedtime., Disp: 90 tablet, Rfl: 1   carvedilol (COREG) 6.25 MG tablet, TAKE 1 TABLET BY MOUTH TWICE DAILY WITH A MEAL, Disp: 180 tablet, Rfl: 0   HYDROcodone-acetaminophen (NORCO) 10-325 MG tablet, Take 1 tablet by mouth every 6 (six) hours as needed., Disp: , Rfl:    omeprazole (PRILOSEC) 40 MG capsule, Use Prilosec (omeprazole) 40 mg PO BID for 6 weeks to promote mucosal healing, then  reduce to 40 mg daily and continue to titrate to lowest effective dose if abdominal symptoms resolved., Disp: 180 capsule, Rfl: 1   Blood Glucose Monitoring Suppl (ACCU-CHEK GUIDE ME) w/Device KIT, USE AS DIRECTED, Disp: 1 kit, Rfl: 1   Continuous Blood Gluc Receiver (FREESTYLE LIBRE 2 READER) DEVI, Use as directed to check blood sugars, Disp: 2 each, Rfl: 5   Continuous Glucose Sensor (FREESTYLE LIBRE 2 SENSOR) MISC, USE AS DIRECTED, Disp: 2 each, Rfl: 3   Glucagon (GVOKE HYPOPEN 2-PACK) 1 MG/0.2ML SOAJ, Inject 1 Act into the skin daily as needed. (Patient not taking: Reported on 12/16/2022), Disp: 2 mL, Rfl: 5  Cardiovascular and other pertinent studies:  EKG 12/16/2022: Sinus rhythm 69 bpm  Left anterior fascicular block Cannot exclude old anteroseptal infarct Nonspecific T-abnormality  Echocardiogram 03/02/2023:  LV systolic function with EF 74%. Left ventricle cavity is normal in size.  Normal left ventricular wall thickness. Normal global wall motion. Normal  diastolic filling pattern, normal LAP. Calculated EF 74%.  No significant valvular abnormalities.   Mobile cardiac telemetry 10 days 10/09/2020 - 10/19/2020: Dominant rhythm: Sinus. HR 57-122 bpm. Avg HR 81 bpm, in sinus rhythm. 2 episodes of narrow complex tachycardia, probably atrial tachycardia. Fastest and longest episode being 8 beat long at 140 bpm. No definite evidence of atrial fibrillation. <1% isolated SVE, couplet/triplets. <1% isolated VE, couplets. No atrial fibrillation/atrial flutter/VT/high grade AV block, sinus pause >3sec noted. 2 patient triggered events,  correlate with sinus rhythm.   Recent labs: 07/10/2022: Glucose 167, BUN/Cr 13/0.69. EGFR >60. Na/K 141/3.4.  H/H 14/44. MCV 90. Platelets 288 HbA1C 6.5% Chol 78, TG 42, HDL 47, LDL 19    Review of Systems  Cardiovascular:  Positive for dyspnea on exertion and leg swelling. Negative for chest pain, palpitations and syncope.         Vitals:    03/16/23 1427  BP: 137/72  Pulse: 64  Resp: 17  SpO2: 95%     Body mass index is 38.27 kg/m. Filed Weights   03/16/23 1427  Weight: 230 lb (104.3 kg)     Objective:   Physical Exam Vitals and nursing note reviewed.  Constitutional:      General: He is not in acute distress. Neck:     Vascular: No JVD.  Cardiovascular:     Rate and Rhythm: Normal rate and regular rhythm.     Heart sounds: Normal heart sounds.  Pulmonary:     Effort: Pulmonary effort is normal.     Breath sounds: Normal breath sounds. No wheezing or rales.  Musculoskeletal:     Right lower leg: Edema (Trace) present.     Left lower leg: Edema (Trace) present.      Visit diagnoses:    ICD-10-CM   1. Mixed hyperlipidemia  E78.2     2. Essential hypertension  I10     3. Dyspnea on exertion  R06.09            Assessment & Recommendations:   72 y.o. African American male with hypertension, type 2 DM, h/o stroke, tobacco dependence, h/o alcohol abuse, obesity, h/o prostate adenocarcinoma  Hypertension: Controlled  Mixed hyperlipidemia: Lipids very well controlled.   Dyspnea: Likely due to underlying possible COPD. Echocaridogram was unremarkable. However, given mild meg edema, reasonable to use low dose lasix 20 mg daily.  F/u in 1 year  Physical exam is unremarkable for carotid bruit. I am not sure there is any intervention to be performed if has occluded Rt ICA, but intact Lt ICA. He has no stroke/TIA symptoms at this time. It would be reasonable to establish care with Neurology. Will defer the referral to PCP Dr. Yetta Barre. Continue medical management, including Aspirin, statin, blood pressure and lipid control. Check lipid panel. Given his recent exertional dyspnea, will check echocardiogram.    Elder Negus, MD Pager: [phone] Office: [phone]

## 2023-04-05 ENCOUNTER — Telehealth: Payer: Self-pay | Admitting: Internal Medicine

## 2023-04-05 DIAGNOSIS — E119 Type 2 diabetes mellitus without complications: Secondary | ICD-10-CM

## 2023-04-05 MED ORDER — FREESTYLE LIBRE 2 READER DEVI
5 refills | Status: DC
Start: 2023-04-05 — End: 2024-04-02

## 2023-04-05 NOTE — Telephone Encounter (Signed)
Patient called and needs the reader for his Libre 2. Please call for further infortmation

## 2023-04-05 NOTE — Telephone Encounter (Signed)
Libre Reader Rx has been sent.

## 2023-04-10 ENCOUNTER — Encounter: Payer: Self-pay | Admitting: Internal Medicine

## 2023-04-10 ENCOUNTER — Ambulatory Visit (INDEPENDENT_AMBULATORY_CARE_PROVIDER_SITE_OTHER): Payer: 59 | Admitting: Internal Medicine

## 2023-04-10 VITALS — BP 128/82 | HR 75 | Temp 98.4°F | Ht 65.0 in | Wt 231.0 lb

## 2023-04-10 DIAGNOSIS — E1169 Type 2 diabetes mellitus with other specified complication: Secondary | ICD-10-CM

## 2023-04-10 DIAGNOSIS — Z794 Long term (current) use of insulin: Secondary | ICD-10-CM | POA: Diagnosis not present

## 2023-04-10 DIAGNOSIS — K7469 Other cirrhosis of liver: Secondary | ICD-10-CM

## 2023-04-10 DIAGNOSIS — I10 Essential (primary) hypertension: Secondary | ICD-10-CM

## 2023-04-10 DIAGNOSIS — E785 Hyperlipidemia, unspecified: Secondary | ICD-10-CM | POA: Diagnosis not present

## 2023-04-10 DIAGNOSIS — E876 Hypokalemia: Secondary | ICD-10-CM

## 2023-04-10 DIAGNOSIS — D539 Nutritional anemia, unspecified: Secondary | ICD-10-CM

## 2023-04-10 DIAGNOSIS — E119 Type 2 diabetes mellitus without complications: Secondary | ICD-10-CM | POA: Diagnosis not present

## 2023-04-10 LAB — MICROALBUMIN / CREATININE URINE RATIO
Creatinine,U: 187.5 mg/dL
Microalb Creat Ratio: 1.2 mg/g (ref 0.0–30.0)
Microalb, Ur: 2.2 mg/dL — ABNORMAL HIGH (ref 0.0–1.9)

## 2023-04-10 LAB — CBC WITH DIFFERENTIAL/PLATELET
Basophils Absolute: 0.1 10*3/uL (ref 0.0–0.1)
Basophils Relative: 1 % (ref 0.0–3.0)
Eosinophils Absolute: 0.7 10*3/uL (ref 0.0–0.7)
Eosinophils Relative: 11.3 % — ABNORMAL HIGH (ref 0.0–5.0)
HCT: 38.8 % — ABNORMAL LOW (ref 39.0–52.0)
Hemoglobin: 12.6 g/dL — ABNORMAL LOW (ref 13.0–17.0)
Lymphocytes Relative: 22.9 % (ref 12.0–46.0)
Lymphs Abs: 1.3 10*3/uL (ref 0.7–4.0)
MCHC: 32.6 g/dL (ref 30.0–36.0)
MCV: 90.1 fl (ref 78.0–100.0)
Monocytes Absolute: 0.4 10*3/uL (ref 0.1–1.0)
Monocytes Relative: 6.4 % (ref 3.0–12.0)
Neutro Abs: 3.4 10*3/uL (ref 1.4–7.7)
Neutrophils Relative %: 58.4 % (ref 43.0–77.0)
Platelets: 264 10*3/uL (ref 150.0–400.0)
RBC: 4.3 Mil/uL (ref 4.22–5.81)
RDW: 14.5 % (ref 11.5–15.5)
WBC: 5.8 10*3/uL (ref 4.0–10.5)

## 2023-04-10 LAB — BASIC METABOLIC PANEL
BUN: 11 mg/dL (ref 6–23)
CO2: 28 meq/L (ref 19–32)
Calcium: 9.5 mg/dL (ref 8.4–10.5)
Chloride: 106 meq/L (ref 96–112)
Creatinine, Ser: 0.86 mg/dL (ref 0.40–1.50)
GFR: 87.95 mL/min (ref >60.00)
Glucose, Bld: 140 mg/dL — ABNORMAL HIGH (ref 70–99)
Potassium: 3.7 meq/L (ref 3.5–5.1)
Sodium: 142 meq/L (ref 135–145)

## 2023-04-10 LAB — URINALYSIS, ROUTINE W REFLEX MICROSCOPIC
Bilirubin Urine: NEGATIVE
Hgb urine dipstick: NEGATIVE
Ketones, ur: NEGATIVE
Leukocytes,Ua: NEGATIVE
Nitrite: NEGATIVE
Specific Gravity, Urine: 1.02 (ref 1.000–1.030)
Total Protein, Urine: NEGATIVE
Urine Glucose: 250 — AB
Urobilinogen, UA: 1 (ref 0.0–1.0)
pH: 6.5 (ref 5.0–8.0)

## 2023-04-10 LAB — HEPATIC FUNCTION PANEL
ALT: 14 U/L (ref 0–53)
AST: 19 U/L (ref 0–37)
Albumin: 4.1 g/dL (ref 3.5–5.2)
Alkaline Phosphatase: 102 U/L (ref 39–117)
Bilirubin, Direct: 0.1 mg/dL (ref 0.0–0.3)
Total Bilirubin: 0.6 mg/dL (ref 0.2–1.2)
Total Protein: 7.4 g/dL (ref 6.0–8.3)

## 2023-04-10 LAB — HEMOGLOBIN A1C: Hgb A1c MFr Bld: 6.8 % — ABNORMAL HIGH (ref 4.6–6.5)

## 2023-04-10 LAB — MAGNESIUM: Magnesium: 2 mg/dL (ref 1.5–2.5)

## 2023-04-10 LAB — PROTIME-INR
INR: 1 ratio (ref 0.8–1.0)
Prothrombin Time: 11 s (ref 9.6–13.1)

## 2023-04-10 MED ORDER — GLUCOSE BLOOD VI STRP
1.0000 | ORAL_STRIP | Freq: Three times a day (TID) | 1 refills | Status: DC
Start: 2023-04-10 — End: 2023-10-25

## 2023-04-10 MED ORDER — ACCU-CHEK GUIDE ME W/DEVICE KIT
1.0000 | PACK | Freq: Three times a day (TID) | 1 refills | Status: DC
Start: 2023-04-10 — End: 2024-01-17

## 2023-04-10 NOTE — Progress Notes (Signed)
Subjective:  Patient ID: Bradley Hull, male    DOB: [birth year]  Age: 72 y.o. MRN: [id number]  CC: Hypertension, Hyperlipidemia, and Diabetes   HPI Bradley Hull presents for f/up --  Discussed the use of AI scribe software for clinical note transcription with the patient, who gave verbal consent to proceed.  History of Present Illness   The patient, with a history of prostate cancer, heart disease, and stroke, presents with concerns about their current medication regimen and recent changes in urinary habits. They report confusion about a prescribed seizure medication, which they stopped taking due to a lack of seizure history. They also express concern about this medication affecting their ability to regain their driver's license.  The patient reports occasional shortness of breath but denies any chest pain. They had a cardiology appointment three months prior and were told their heart appeared normal. However, they express confusion about their heart medication and its purpose.  They also report changes in urinary habits, with periods of frequent urination and periods of difficulty urinating. They have been managing these symptoms with Tamsulosin as needed. They deny any dizziness or lightheadedness.  The patient also mentions concerns about their blood sugar control. They report frequent urination but deny excessive thirst or changes in vision. They recently saw an eye doctor who reported their vision as good. However, they have not been able to monitor their blood sugar levels due to their glucose meter being damaged.  Lastly, the patient reports occasional lower extremity swelling, which they manage with a prescribed water pill. They deny any current swelling at the time of the consultation.       Outpatient Medications Prior to Visit  Medication Sig Dispense Refill   amLODipine (NORVASC) 10 MG tablet Take 1 tablet by mouth once daily 90 tablet 0   aspirin EC 81 MG tablet Take 81  mg by mouth daily. Swallow whole.     atorvastatin (LIPITOR) 80 MG tablet Take 1 tablet (80 mg total) by mouth at bedtime. 90 tablet 1   carvedilol (COREG) 6.25 MG tablet TAKE 1 TABLET BY MOUTH TWICE DAILY WITH A MEAL 180 tablet 0   Continuous Glucose Receiver (FREESTYLE LIBRE 2 READER) DEVI Use as directed to check blood sugars 2 each 5   Continuous Glucose Sensor (FREESTYLE LIBRE 2 SENSOR) MISC USE AS DIRECTED 2 each 3   furosemide (LASIX) 20 MG tablet Take 1 tablet (20 mg total) by mouth daily. 30 tablet 1   Glucagon (GVOKE HYPOPEN 2-PACK) 1 MG/0.2ML SOAJ Inject 1 Act into the skin daily as needed. 2 mL 5   omeprazole (PRILOSEC) 40 MG capsule Use Prilosec (omeprazole) 40 mg PO BID for 6 weeks to promote mucosal healing, then reduce to 40 mg daily and continue to titrate to lowest effective dose if abdominal symptoms resolved. 180 capsule 1   Blood Glucose Monitoring Suppl (ACCU-CHEK GUIDE ME) w/Device KIT USE AS DIRECTED 1 kit 1   No facility-administered medications prior to visit.    ROS Review of Systems  Constitutional:  Negative for appetite change, chills, diaphoresis, fatigue and fever.  HENT: Negative.    Eyes: Negative.   Respiratory:  Positive for shortness of breath. Negative for choking, chest tightness and wheezing.   Cardiovascular:  Negative for chest pain, palpitations and leg swelling.  Gastrointestinal:  Negative for abdominal pain, constipation, diarrhea, nausea and vomiting.  Endocrine: Positive for polydipsia, polyphagia and polyuria.  Genitourinary: Negative.  Negative for difficulty urinating.  Musculoskeletal: Negative.  Skin: Negative.   Neurological: Negative.  Negative for dizziness.  Hematological:  Negative for adenopathy. Does not bruise/bleed easily.  Psychiatric/Behavioral: Negative.      Objective:  BP 128/82 (BP Location: Left Arm, Patient Position: Sitting, Cuff Size: Large)   Pulse 75   Temp 98.4 F (36.9 C) (Oral)   Ht 5\' 5"  (1.651 m)    Wt 231 lb (104.8 kg)   SpO2 98%   BMI 38.44 kg/m   BP Readings from Last 3 Encounters:  04/10/23 128/82  03/16/23 137/72  12/16/22 (!) 149/83    Wt Readings from Last 3 Encounters:  04/10/23 231 lb (104.8 kg)  03/16/23 230 lb (104.3 kg)  02/13/23 228 lb (103.4 kg)    Physical Exam Vitals reviewed.  Constitutional:      Appearance: Normal appearance.  HENT:     Nose: Nose normal.  Eyes:     General: No scleral icterus.    Conjunctiva/sclera: Conjunctivae normal.  Cardiovascular:     Rate and Rhythm: Normal rate and regular rhythm.     Heart sounds: No murmur heard.    No friction rub. No gallop.  Pulmonary:     Breath sounds: No stridor. No wheezing, rhonchi or rales.  Abdominal:     General: Abdomen is flat.     Palpations: There is no mass.     Tenderness: There is no abdominal tenderness. There is no guarding.     Hernia: No hernia is present.  Musculoskeletal:        General: Normal range of motion.     Cervical back: Neck supple.     Right lower leg: No edema.     Left lower leg: No edema.  Lymphadenopathy:     Cervical: No cervical adenopathy.  Skin:    General: Skin is warm and dry.  Neurological:     General: No focal deficit present.     Mental Status: He is alert. Mental status is at baseline.  Psychiatric:        Mood and Affect: Mood normal.        Behavior: Behavior normal.     Lab Results  Component Value Date   WBC 5.8 04/10/2023   HGB 12.6 (L) 04/10/2023   HCT 38.8 (L) 04/10/2023   PLT 264.0 04/10/2023   GLUCOSE 140 (H) 04/10/2023   CHOL 78 (L) 12/21/2022   TRIG 42 12/21/2022   HDL 47 12/21/2022   LDLCALC 19 12/21/2022   ALT 14 04/10/2023   AST 19 04/10/2023   NA 142 04/10/2023   K 3.7 04/10/2023   CL 106 04/10/2023   CREATININE 0.86 04/10/2023   BUN 11 04/10/2023   CO2 28 04/10/2023   TSH 1.03 02/04/2021   PSA 0.1 11/17/2022   INR 1.0 04/10/2023   HGBA1C 6.8 (H) 04/10/2023   MICROALBUR 2.2 (H) 04/10/2023    CT CHEST LUNG  CA SCREEN LOW DOSE W/O CM  Result Date: 02/21/2023 CLINICAL DATA:  Current smoker with 20 pack-year history EXAM: CT CHEST WITHOUT CONTRAST LOW-DOSE FOR LUNG CANCER SCREENING TECHNIQUE: Multidetector CT imaging of the chest was performed following the standard protocol without IV contrast. RADIATION DOSE REDUCTION: This exam was performed according to the departmental dose-optimization program which includes automated exposure control, adjustment of the mA and/or kV according to patient size and/or use of iterative reconstruction technique. COMPARISON:  CT abdomen and pelvis dated July 11, 2022 FINDINGS: Cardiovascular: Normal heart size. No pericardial effusion. Normal caliber thoracic aorta with moderate atherosclerotic  disease. Moderate coronary artery calcifications. Mediastinum/Nodes: Esophagus and thyroid are unremarkable. No enlarged lymph nodes seen in the chest. Lungs/Pleura: Central airways are patent. No consolidation, pleural effusion or pneumothorax. Small ground-glass nodule of the right upper lobe measuring 6.3 mm on image 109. Upper Abdomen: Partially visualized cystic lesions of the bilateral kidneys, unchanged when compared with the prior abdomen and pelvis CT. Musculoskeletal: Lipoma of the posterior right chest wall, unchanged when compared with prior. No aggressive appearing osseous lesions. IMPRESSION: 1. Lung-RADS 2S, benign appearance or behavior. Continue annual screening with low-dose chest CT without contrast in 12 months. S modifier for coronary artery calcifications. 2. Moderate coronary artery calcifications, recommend ASCVD risk assessment. 3. Aortic Atherosclerosis (ICD10-I70.0). Electronically Signed   By: Allegra Lai M.D.   On: 02/21/2023 17:18    Assessment & Plan:   Other cirrhosis of liver (HCC)- His MELD score is reassuring at 6. -     Protime-INR; Future -     Hepatic function panel; Future  Hyperlipidemia associated with type 2 diabetes mellitus (HCC) - LDL  goal achieved. Doing well on the statin  -     Hepatic function panel; Future  Essential hypertension- His blood pressure is adequately well-controlled. -     Urinalysis, Routine w reflex microscopic; Future -     Basic metabolic panel; Future -     CBC with Differential/Platelet; Future  Chronic hypokalemia -     Basic metabolic panel; Future -     Magnesium; Future  Insulin-requiring or dependent type II diabetes mellitus (HCC)- His blood sugar is adequately well-controlled. -     Urinalysis, Routine w reflex microscopic; Future -     Microalbumin / creatinine urine ratio; Future -     Basic metabolic panel; Future -     Hemoglobin A1c; Future -     HM Diabetes Foot Exam -     Accu-Chek Guide Me; Inject 1 Act into the skin 3 (three) times daily. USE AS DIRECTED  Dispense: 1 kit; Refill: 1 -     Glucose Blood; 1 each by Other route in the morning, at noon, and at bedtime. Use as instructed  Dispense: 300 each; Refill: 1     Follow-up: Return in about 4 months (around 08/10/2023).  Sanda Linger, MD

## 2023-04-10 NOTE — Patient Instructions (Signed)

## 2023-04-14 ENCOUNTER — Other Ambulatory Visit: Payer: Self-pay

## 2023-04-14 DIAGNOSIS — E785 Hyperlipidemia, unspecified: Secondary | ICD-10-CM

## 2023-04-14 DIAGNOSIS — D539 Nutritional anemia, unspecified: Secondary | ICD-10-CM | POA: Insufficient documentation

## 2023-04-14 DIAGNOSIS — I10 Essential (primary) hypertension: Secondary | ICD-10-CM

## 2023-04-14 MED ORDER — ATORVASTATIN CALCIUM 80 MG PO TABS: 80.0000 mg | ORAL_TABLET | Freq: Every day | ORAL | 0 refills | Status: DC

## 2023-04-14 MED ORDER — AMLODIPINE BESYLATE 10 MG PO TABS: 10.0000 mg | ORAL_TABLET | Freq: Every day | ORAL | 0 refills | Status: DC

## 2023-04-14 NOTE — Assessment & Plan Note (Signed)
Will evaluate for vitamin deficiencies.

## 2023-05-08 ENCOUNTER — Other Ambulatory Visit (INDEPENDENT_AMBULATORY_CARE_PROVIDER_SITE_OTHER): Payer: 59

## 2023-05-08 DIAGNOSIS — D539 Nutritional anemia, unspecified: Secondary | ICD-10-CM | POA: Diagnosis not present

## 2023-05-08 LAB — IBC + FERRITIN
Ferritin: 25.5 ng/mL (ref 22.0–322.0)
Iron: 58 ug/dL (ref 42–165)
Saturation Ratios: 12.4 % — ABNORMAL LOW (ref 20.0–50.0)
TIBC: 466.2 ug/dL — ABNORMAL HIGH (ref 250.0–450.0)
Transferrin: 333 mg/dL (ref 212.0–360.0)

## 2023-05-08 LAB — FOLATE: Folate: 13 ng/mL (ref >5.9)

## 2023-05-08 LAB — VITAMIN B12: Vitamin B-12: 291 pg/mL (ref 211–911)

## 2023-05-09 ENCOUNTER — Other Ambulatory Visit (HOSPITAL_COMMUNITY): Payer: Self-pay

## 2023-05-15 ENCOUNTER — Other Ambulatory Visit: Payer: Self-pay | Admitting: Internal Medicine

## 2023-05-15 ENCOUNTER — Telehealth: Payer: Self-pay | Admitting: Internal Medicine

## 2023-05-15 NOTE — Telephone Encounter (Signed)
Please advise medication Refill. I don't see the medication on his medication list

## 2023-05-15 NOTE — Telephone Encounter (Signed)
Prescription Request  05/15/2023  LOV: 04/10/2023  What is the name of the medication or equipment? Insulin - patient thinks it's call soliqual  Have you contacted your pharmacy to request a refill? Yes   Which pharmacy would you like this sent to?  [facility] [address], [place] - [address] [place] [address] Phone: [phone] Fax: [phone]    Patient notified that their request is being sent to the clinical staff for review and that they should receive a response within 2 business days.   Please advise at Mobile [phone] (mobile)

## 2023-05-17 ENCOUNTER — Other Ambulatory Visit: Payer: Self-pay | Admitting: Internal Medicine

## 2023-05-17 DIAGNOSIS — E119 Type 2 diabetes mellitus without complications: Secondary | ICD-10-CM

## 2023-05-17 MED ORDER — SOLIQUA 100-33 UNT-MCG/ML ~~LOC~~ SOPN
20.0000 [IU] | PEN_INJECTOR | Freq: Every day | SUBCUTANEOUS | 0 refills | Status: DC
Start: 2023-05-17 — End: 2023-07-27

## 2023-05-19 ENCOUNTER — Other Ambulatory Visit: Payer: Self-pay | Admitting: Internal Medicine

## 2023-05-19 ENCOUNTER — Encounter: Payer: Self-pay | Admitting: Internal Medicine

## 2023-05-19 DIAGNOSIS — D538 Other specified nutritional anemias: Secondary | ICD-10-CM | POA: Insufficient documentation

## 2023-05-19 LAB — RETICULOCYTES
ABS Retic: 51740 {cells}/uL (ref 25000–90000)
Retic Ct Pct: 1.3 %

## 2023-05-19 LAB — VITAMIN B1: Vitamin B1 (Thiamine): 8 nmol/L (ref 8–30)

## 2023-05-19 LAB — ZINC: Zinc: 58 ug/dL — ABNORMAL LOW (ref 60–130)

## 2023-05-19 MED ORDER — ZINC GLUCONATE 50 MG PO TABS
50.0000 mg | ORAL_TABLET | Freq: Every day | ORAL | 1 refills | Status: DC
Start: 2023-05-19 — End: 2023-11-17

## 2023-05-24 ENCOUNTER — Other Ambulatory Visit: Payer: Self-pay | Admitting: Nurse Practitioner

## 2023-05-24 DIAGNOSIS — K7469 Other cirrhosis of liver: Secondary | ICD-10-CM

## 2023-05-24 DIAGNOSIS — Z720 Tobacco use: Secondary | ICD-10-CM | POA: Insufficient documentation

## 2023-05-25 ENCOUNTER — Other Ambulatory Visit: Payer: Self-pay | Admitting: Internal Medicine

## 2023-05-25 ENCOUNTER — Telehealth: Payer: Self-pay | Admitting: Internal Medicine

## 2023-05-25 DIAGNOSIS — Z720 Tobacco use: Secondary | ICD-10-CM

## 2023-05-25 MED ORDER — NICOTINE 7 MG/24HR TD PT24: 7.0000 mg | MEDICATED_PATCH | Freq: Every day | TRANSDERMAL | 0 refills | Status: DC

## 2023-05-25 MED ORDER — NICOTINE 21 MG/24HR TD PT24: 21.0000 mg | MEDICATED_PATCH | Freq: Every day | TRANSDERMAL | 0 refills | Status: DC

## 2023-05-25 MED ORDER — NICOTINE 14 MG/24HR TD PT24: 14.0000 mg | MEDICATED_PATCH | Freq: Every day | TRANSDERMAL | 0 refills | Status: DC

## 2023-05-25 NOTE — Telephone Encounter (Signed)
Please Advise

## 2023-05-25 NOTE — Telephone Encounter (Signed)
Patient would like to know if he can be prescribed patches to help him stop smoking. Last OV was 04/10/2023. Does patient need an appointment? Preferred pharmacy is [facility] [address], [place] - [address]. Best callback is [phone].

## 2023-06-12 ENCOUNTER — Other Ambulatory Visit: Payer: Self-pay | Admitting: Internal Medicine

## 2023-06-12 DIAGNOSIS — I10 Essential (primary) hypertension: Secondary | ICD-10-CM

## 2023-06-18 NOTE — Progress Notes (Deleted)
  Cardiology Office Note:  .   Date:  06/18/2023  ID:  Bradley Hull, DOB February 27, 1951, MRN [id number] PCP: [name], MD  [facility] Providers Cardiologist:  Elder Negus, MD {  History of Present Illness: .   Bradley Hull is a 72 y.o. male hypertension, type 2 DM, h/o stroke( Rt acute ischemic stroke in 05/2021) , tobacco dependence, h/o alcohol abuse, obesity, h/o prostate adenocarcinoma.  Last seen by Dr. Rosemary Holms on 03/16/2019 for at which time blood pressure was well-controlled, he was complaining of dyspnea which she felt was related to underlying COPD and ongoing tobacco abuse.  He did have some mild lower extremity edema and he was given 20 mg of Lasix daily.  He was to follow up in 1 year he was referred back to his primary care provider who was recommended to refer to neurology in the setting of CVA.  ROS: ***  Studies Reviewed: .      Echocardiogram 03/02/2023:  LV systolic function with EF 74%. Left ventricle cavity is normal in size.  Normal left ventricular wall thickness. Normal global wall motion. Normal  diastolic filling pattern, normal LAP. Calculated EF 74%.  No significant valvular abnormalities.    Mobile cardiac telemetry 10 days 10/09/2020 - 10/19/2020: Dominant rhythm: Sinus. HR 57-122 bpm. Avg HR 81 bpm, in sinus rhythm. 2 episodes of narrow complex tachycardia, probably atrial tachycardia. Fastest and longest episode being 8 beat long at 140 bpm. No definite evidence of atrial fibrillation. <1% isolated SVE, couplet/triplets. <1% isolated VE, couplets. No atrial fibrillation/atrial flutter/VT/high grade AV block, sinus pause >3sec noted. 2 patient triggered events, correlate with sinus rhythm.  *** EKG Interpretation Date/Time:    Ventricular Rate:    PR Interval:    QRS Duration:    QT Interval:    QTC Calculation:   R Axis:      Text Interpretation:      Physical Exam:   VS:  There were no vitals taken for this visit.   Wt  Readings from Last 3 Encounters:  04/10/23 231 lb (104.8 kg)  03/16/23 230 lb (104.3 kg)  02/13/23 228 lb (103.4 kg)    GEN: Well nourished, well developed in no acute distress NECK: No JVD; No carotid bruits CARDIAC: ***RRR, no murmurs, rubs, gallops RESPIRATORY:  Clear to auscultation without rales, wheezing or rhonchi  ABDOMEN: Soft, non-tender, non-distended EXTREMITIES:  No edema; No deformity   ASSESSMENT AND PLAN: .   ***    {Are you ordering a CV Procedure (e.g. stress test, cath, DCCV, TEE, etc)?   Press F2        :[id number]}    Signed, Bettey Mare. Liborio Nixon, ANP, AACC

## 2023-06-21 ENCOUNTER — Ambulatory Visit: Payer: 59 | Admitting: Adult Health

## 2023-06-26 LAB — PSA: PSA: 0.1

## 2023-07-05 ENCOUNTER — Other Ambulatory Visit: Payer: Self-pay | Admitting: Internal Medicine

## 2023-07-05 DIAGNOSIS — I10 Essential (primary) hypertension: Secondary | ICD-10-CM

## 2023-07-15 ENCOUNTER — Other Ambulatory Visit: Payer: Self-pay | Admitting: Internal Medicine

## 2023-07-15 DIAGNOSIS — E785 Hyperlipidemia, unspecified: Secondary | ICD-10-CM

## 2023-07-20 ENCOUNTER — Other Ambulatory Visit: Payer: Self-pay | Admitting: Internal Medicine

## 2023-07-20 ENCOUNTER — Telehealth: Payer: Self-pay

## 2023-07-20 ENCOUNTER — Encounter: Payer: Self-pay | Admitting: Internal Medicine

## 2023-07-20 NOTE — Telephone Encounter (Signed)
Letter from 07/20/2023 has been mailed to the patient per the patients request.

## 2023-07-20 NOTE — Telephone Encounter (Unsigned)
Copied from CRM [id number]. Topic: Clinical - Medication Question >> Jul 20, 2023 12:00 PM Kathryne Eriksson wrote: Reason for CRM: Seizure Medication >> Jul 20, 2023 12:07 PM Kathryne Eriksson wrote: Patient states he's needing to make the [facility] aware that he isn't on any type of seizure medication, so that he's able to get his driver's license. States he came into the office and Dr. Yetta Barre wasn't able to find the source of who prescribed it and tossed the box in the trash.

## 2023-07-21 ENCOUNTER — Other Ambulatory Visit: Payer: Self-pay | Admitting: Internal Medicine

## 2023-07-21 DIAGNOSIS — E119 Type 2 diabetes mellitus without complications: Secondary | ICD-10-CM

## 2023-07-27 NOTE — Telephone Encounter (Signed)
 Copied from CRM [id number]. Topic: Clinical - Medication Question >> Jul 27, 2023  7:48 AM Zacyrah J wrote: Reason for CRM: pt was given Hydrocodone  10mg  from different provider. Pt is stating that they take 10-15 a day and now pt is having side effects from the Hydrocodone  like heading is hurting and stomach bothering them. Pt would like for Dr. Joshua to call them something in for this. Pt also mention they would not be able to come in for appointment due to being 60 miles away.

## 2023-08-07 ENCOUNTER — Other Ambulatory Visit: Payer: Self-pay | Admitting: Internal Medicine

## 2023-08-07 DIAGNOSIS — E119 Type 2 diabetes mellitus without complications: Secondary | ICD-10-CM

## 2023-08-09 ENCOUNTER — Telehealth: Payer: Self-pay | Admitting: Internal Medicine

## 2023-08-09 ENCOUNTER — Other Ambulatory Visit: Payer: Self-pay

## 2023-08-09 DIAGNOSIS — Z1211 Encounter for screening for malignant neoplasm of colon: Secondary | ICD-10-CM

## 2023-08-09 NOTE — Telephone Encounter (Signed)
 Attempted to reach patient, voicemail full. He is due for a colonoscopy in Feb 2025. Order placed for patient.

## 2023-08-09 NOTE — Telephone Encounter (Signed)
 Patient would like a referral for a colonoscopy.   Copied from CRM [id number]. Topic: Appointments - Scheduling Inquiry for Clinic >> Aug 09, 2023  2:20 PM Deaijah H wrote: Reason for CRM: Patient would like for Dr. Rochelle Chu to schedule him for an colonoscopy / please call [phone]

## 2023-08-17 ENCOUNTER — Ambulatory Visit
Admission: RE | Admit: 2023-08-17 | Discharge: 2023-08-17 | Disposition: A | Discharge status: Home / Self Care | Payer: 59 | Source: Ambulatory Visit | Attending: Nurse Practitioner | Admitting: Nurse Practitioner

## 2023-08-17 DIAGNOSIS — K7469 Other cirrhosis of liver: Secondary | ICD-10-CM

## 2023-08-18 ENCOUNTER — Ambulatory Visit: Payer: 59 | Admitting: Cardiology

## 2023-08-18 DIAGNOSIS — E782 Mixed hyperlipidemia: Secondary | ICD-10-CM | POA: Insufficient documentation

## 2023-08-18 NOTE — Progress Notes (Deleted)
  Cardiology Office Note:  .   Date:  08/18/2023  ID:  Bradley Hull, DOB 18-Jan-1951, MRN [id number] PCP: [name], MD  [facility] Providers Cardiologist:  Truett Mainland, MD PCP: [name], MD  No chief complaint on file.     History of Present Illness: .    Bradley Hull is a 73 y.o. male with hypertension, type 2 DM, h/o stroke, tobacco dependence, h/o alcohol abuse, obesity, h/o prostate adenocarcinoma    There were no vitals filed for this visit.   ROS: *** ROS   Studies Reviewed: .        *** Independently interpreted 03/2023: HbA1C 6.8% Hb 12.6 Cr 0.86  11/2022: Chol 78, TG 42, HDL 47, LDL 19  Risk Assessment/Calculations:   {Does this patient have ATRIAL FIBRILLATION?:[phone]}     Physical Exam:   Physical Exam   VISIT DIAGNOSES: No diagnosis found.   ASSESSMENT AND PLAN: .    Bradley Hull is a 73 y.o. male with hypertension, type 2 DM, h/o stroke, tobacco dependence, h/o alcohol abuse, obesity, h/o prostate adenocarcinoma   Hypertension: Controlled   Mixed hyperlipidemia: Lipids very well controlled.    Dyspnea: Likely due to underlying possible COPD. Echocaridogram was unremarkable. However, given mild meg edema, reasonable to use low dose lasix 20 mg daily.   F/u in 1 year     {Are you ordering a CV Procedure (e.g. stress test, cath, DCCV, TEE, etc)?   Press F2        :[id number]}    No orders of the defined types were placed in this encounter.    F/u in ***  Signed, Elder Negus, MD

## 2023-08-28 NOTE — Progress Notes (Deleted)
 Cardiology Office Note    Patient Name: Bradley Hull Date of Encounter: 08/28/2023  Primary Care Provider:  Joshua Debby CROME, MD Primary Cardiologist:  Newman JINNY Lawrence, MD Primary Electrophysiologist: None   Past Medical History    Past Medical History:  Diagnosis Date   Arthritis    Benign prostatic hyperplasia    Cirrhosis (HCC)    Colon polyps    Complication of anesthesia    bp droppped and heart rate sped up after 09-09-2020 endoscopy /colonscopy   DM type 2 (diabetes mellitus, type 2) (HCC)    Hepatitis C    took treatment for 2018 issue resolved per pt   History of kidney stones    Hypertension    Nicotine  addiction    Obesity    Osteoarthritis of both knees    Prostate cancer (HCC)    Renal lithiasis    Stroke [facility]     History of Present Illness  Bradley Hull is a 73 y.o. male with a PMH of HTN, DM type II, tobacco abuse, EtOH abuse, hepatitis C with cirrhosis and esophageal varices, OSA, obesity prostate cancer, right acute ischemic CVA 05/2021, carotid artery disease who presents today for 23-month follow-up.  Mr. Brabson was seen initially by Dr.Patwarhdan in 2022 irregular heartbeat and coronary artery disease.  He was undergoing a colonoscopy when he became bradycardic and hypotensive new onset AF noted on tracings.  He was placed on a 2-week monitor to observe for AF that showed no episodes of AF/atrial flutter/VT/high-grade AV block.  He also completed a 2D echo that showed EF of 60% with mild LVH and no TR or significant valve abnormalities.  He suffered an acute ischemic right MCA stroke on 06/23/2021 with right amaurosis fugax correlating to right ICA stenosis.  He was not a candidate for tPA and infarcts appeared to be not embolic.  He was also found to have complete occlusion of the right internal carotid artery that was treated medically.  He was admitted 08/20/2020 found to have large symptomatic pericardial effusion with tamponade treated with  pericardiocentesis.  He was last seen by Dr. Christophe on 03/16/2023 and was doing well with no chest pain or recurrent neurological deficits.  He was given Lasix  20 mg for lower extremity edema and advised to follow-up in 1 year.  During today's visit the patient reports*** .  Patient denies chest pain, palpitations, dyspnea, PND, orthopnea, nausea, vomiting, dizziness, syncope, edema, weight gain, or early satiety.  ***Notes: -Last ischemic evaluation: -Last echo: -Interim ED visits: Review of Systems  Please see the history of present illness.    All other systems reviewed and are otherwise negative except as noted above.  Physical Exam    Wt Readings from Last 3 Encounters:  04/10/23 231 lb (104.8 kg)  03/16/23 230 lb (104.3 kg)  02/13/23 228 lb (103.4 kg)   CD:Uyzmz were no vitals filed for this visit.,There is no height or weight on file to calculate BMI. GEN: Well nourished, well developed in no acute distress Neck: No JVD; No carotid bruits Pulmonary: Clear to auscultation without rales, wheezing or rhonchi  Cardiovascular: Normal rate. Regular rhythm. Normal S1. Normal S2.   Murmurs: There is no murmur.  ABDOMEN: Soft, non-tender, non-distended EXTREMITIES:  No edema; No deformity   EKG/LABS/ Recent Cardiac Studies   ECG personally reviewed by me today - ***  Risk Assessment/Calculations:   {Does this patient have ATRIAL FIBRILLATION?:[phone]}      Lab Results  Component Value  Date   WBC 5.8 04/10/2023   HGB 12.6 (L) 04/10/2023   HCT 38.8 (L) 04/10/2023   MCV 90.1 04/10/2023   PLT 264.0 04/10/2023   Lab Results  Component Value Date   CREATININE 0.86 04/10/2023   BUN 11 04/10/2023   NA 142 04/10/2023   K 3.7 04/10/2023   CL 106 04/10/2023   CO2 28 04/10/2023   Lab Results  Component Value Date   CHOL 78 (L) 12/21/2022   HDL 47 12/21/2022   LDLCALC 19 12/21/2022   TRIG 42 12/21/2022   CHOLHDL 1.7 12/21/2022    Lab Results  Component Value Date    HGBA1C 6.8 (H) 04/10/2023   Assessment & Plan    1.  History of CVA:  2.  Essential hypertension:  3.  Hyperlipidemia:  4.  Carotid artery disease:  5.  Chronic hepatitis C with cirrhosis:      Disposition: Follow-up with Newman JINNY Lawrence, MD or APP in *** months {Are you ordering a CV Procedure (e.g. stress test, cath, DCCV, TEE, etc)?   Press F2        :[id number]}   Signed, Wyn Raddle, Jackee Shove, NP 08/28/2023, 5:59 PM [facility]

## 2023-08-29 ENCOUNTER — Ambulatory Visit: Payer: 59 | Admitting: Nurse Practitioner

## 2023-08-29 ENCOUNTER — Other Ambulatory Visit: Payer: Self-pay | Admitting: Internal Medicine

## 2023-08-29 DIAGNOSIS — I10 Essential (primary) hypertension: Secondary | ICD-10-CM

## 2023-08-29 DIAGNOSIS — E782 Mixed hyperlipidemia: Secondary | ICD-10-CM

## 2023-08-29 DIAGNOSIS — B182 Chronic viral hepatitis C: Secondary | ICD-10-CM

## 2023-08-29 DIAGNOSIS — I639 Cerebral infarction, unspecified: Secondary | ICD-10-CM

## 2023-09-15 ENCOUNTER — Ambulatory Visit: Payer: Self-pay | Admitting: Cardiology

## 2023-09-19 ENCOUNTER — Ambulatory Visit: Payer: 59 | Admitting: Cardiology

## 2023-09-30 ENCOUNTER — Other Ambulatory Visit: Payer: Self-pay | Admitting: Internal Medicine

## 2023-09-30 DIAGNOSIS — I10 Essential (primary) hypertension: Secondary | ICD-10-CM

## 2023-10-11 NOTE — Progress Notes (Unsigned)
 Cardiology Office Note    Patient Name: Bradley Hull Date of Encounter: 10/11/2023  Primary Care Provider:  [name], MD Primary Cardiologist:  Elder Negus, MD Primary Electrophysiologist: None   Past Medical History    Past Medical History:  Diagnosis Date   Arthritis    Benign prostatic hyperplasia    Cirrhosis (HCC)    Colon polyps    Complication of anesthesia    bp droppped and heart rate sped up after 09-09-2020 endoscopy /colonscopy   DM type 2 (diabetes mellitus, type 2) (HCC)    Hepatitis C    took treatment for 2018 issue resolved per pt   History of kidney stones    Hypertension    Nicotine addiction    Obesity    Osteoarthritis of both knees    Prostate cancer (HCC)    Renal lithiasis    Stroke [facility])     History of Present Illness  Bradley Hull is a 73 y.o. male with a PMH of HTN, DM type II, tobacco abuse, EtOH abuse, hepatitis C with cirrhosis and esophageal varices, OSA, obesity prostate cancer, right acute ischemic CVA 05/2021, carotid artery disease who presents today for 50-month follow-up.   Mr. Krager was seen initially by Dr.Patwarhdan in 2022 irregular heartbeat and coronary artery disease.  He was undergoing a colonoscopy when he became bradycardic and hypotensive new onset AF noted on tracings.  He was placed on a 2-week monitor to observe for AF that showed no episodes of AF/atrial flutter/VT/high-grade AV block. He also completed a 2D echo that showed EF of 60% with mild LVH and no TR or significant valve abnormalities. He suffered an acute ischemic right MCA stroke on 06/23/2021 with right amaurosis fugax correlating to right ICA stenosis. He was not a candidate for tPA and infarcts appeared to be not embolic. He was also found to have complete occlusion of the right internal carotid artery that was treated medically.  He was admitted 08/20/2020 found to have large symptomatic pericardial effusion with tamponade treated with  pericardiocentesis. He was last seen by Dr. Enid Derry on 03/16/2023 and was doing well with no chest pain or recurrent neurological deficits.  He was given Lasix 20 mg for lower extremity edema and advised to follow-up in 1 year.  Mr. Wardlow presents today for 69-month follow-up.  He reports since his previous visit he has been doing well with no new cardiac complaints.  He presents today for cardiac clearance for upcoming colonoscopy. He experienced atrial fibrillation during his last colonoscopy four years ago but has not noticed any recurrence of fast heartbeats or palpitations since then. No significant alcohol or caffeine consumption, which could potentially trigger atrial fibrillation. He has new onset shortness of breath during routine activities such as walking, which improves with rest. No associated chest tightness. He has a history of fluid retention and swelling, particularly in his legs, which he attributes to not taking Lasix for the past seven months. He lost track of his medication regimen and has not been taking Lasix, which was previously prescribed to manage fluid retention. He is currently taking medications for blood pressure and cholesterol, which are stable. He also takes insulin for diabetes. His kidney function was good at the last check. He is not currently taking tamsulosin regularly, only as needed. His PSA levels are low and stable following previous cancer treatment. No dizziness and he is able to perform daily activities independently, including walking indoors, dressing, and managing household tasks.  Patient denies chest pain, palpitations, dyspnea, PND, orthopnea, nausea, vomiting, dizziness, syncope, edema, weight gain, or early satiety.  Review of Systems  Please see the history of present illness.    All other systems reviewed and are otherwise negative except as noted above.  Physical Exam    Wt Readings from Last 3 Encounters:  04/10/23 231 lb (104.8 kg)  03/16/23  230 lb (104.3 kg)  02/13/23 228 lb (103.4 kg)   FA:OZHYQ were no vitals filed for this visit.,There is no height or weight on file to calculate BMI. GEN: Well nourished, well developed in no acute distress Neck: No JVD; No carotid bruits Pulmonary: Clear to auscultation without rales, wheezing or rhonchi  Cardiovascular: Normal rate. Regular rhythm. Normal S1. Normal S2.   Murmurs: There is no murmur.  ABDOMEN: Soft, non-tender, non-distended EXTREMITIES:  No edema; No deformity   EKG/LABS/ Recent Cardiac Studies   ECG personally reviewed by me today -none completed today  Risk Assessment/Calculations:          Lab Results  Component Value Date   WBC 5.8 04/10/2023   HGB 12.6 (L) 04/10/2023   HCT 38.8 (L) 04/10/2023   MCV 90.1 04/10/2023   PLT 264.0 04/10/2023   Lab Results  Component Value Date   CREATININE 0.86 04/10/2023   BUN 11 04/10/2023   NA 142 04/10/2023   K 3.7 04/10/2023   CL 106 04/10/2023   CO2 28 04/10/2023   Lab Results  Component Value Date   CHOL 78 (L) 12/21/2022   HDL 47 12/21/2022   LDLCALC 19 12/21/2022   TRIG 42 12/21/2022   CHOLHDL 1.7 12/21/2022    Lab Results  Component Value Date   HGBA1C 6.8 (H) 04/10/2023   Assessment & Plan    1.  History of CVA: -s/p ischemic right MCA stroke on 06/23/2021 with right amaurosis fugax correlating to right ICA stenosis. -Today patient reports no residual symptoms or recurrence of amaurosis -Continue current GDMT with Lipitor 80 mg, ASA 81 mg   2.  Essential hypertension: -Patient's blood pressure today was stable at 110/70 -Continue Norvasc 10 mg and carvedilol 6.25 mg twice daily   3.  Hyperlipidemia: -LDL cholesterol was stable at 19 -Continue Lipitor 80 mg   4.  Carotid artery disease: -CTA head/neck which reveals a right ICA occlusion with distal reconstitution.  -Continue current GDMT with ASA 81 mg and Lipitor 80 mg.   5.  Chronic hepatitis C with cirrhosis: -He is currently followed  by [facility] and was last seen on 05/24/2023.  6.  History of prostate CA: -s/p radiation therapy and currently followed by urology. -Is currently on tamsulosin as needed  7.  Preoperative clearance: -Patient's RCRI score is 11%  -The patient affirms he has been doing well without any new cardiac symptoms. They are able to achieve 7 METS without cardiac limitations. Therefore, based on ACC/AHA guidelines, the patient would be at acceptable risk for the planned procedure without further cardiovascular testing. The patient was advised that if he develops new symptoms prior to surgery to contact our office to arrange for a follow-up visit, and he verbalized understanding.   -Per protocol patient can hold ASA 81 mg 7 days prior to procedure and should restart postprocedure when surgically safe  Disposition: Follow-up with Elder Negus, MD or APP in 6 months   Signed, Napoleon Form, Leodis Rains, NP 10/11/2023, 10:00 AM [facility]

## 2023-10-12 ENCOUNTER — Encounter: Payer: Self-pay | Admitting: Nurse Practitioner

## 2023-10-12 ENCOUNTER — Ambulatory Visit: Payer: 59 | Attending: Nurse Practitioner | Admitting: Nurse Practitioner

## 2023-10-12 VITALS — BP 110/70 | HR 67 | Ht 65.0 in | Wt 249.6 lb

## 2023-10-12 DIAGNOSIS — Z01810 Encounter for preprocedural cardiovascular examination: Secondary | ICD-10-CM

## 2023-10-12 DIAGNOSIS — I639 Cerebral infarction, unspecified: Secondary | ICD-10-CM

## 2023-10-12 DIAGNOSIS — E782 Mixed hyperlipidemia: Secondary | ICD-10-CM | POA: Diagnosis not present

## 2023-10-12 DIAGNOSIS — B182 Chronic viral hepatitis C: Secondary | ICD-10-CM

## 2023-10-12 DIAGNOSIS — I10 Essential (primary) hypertension: Secondary | ICD-10-CM | POA: Diagnosis not present

## 2023-10-12 DIAGNOSIS — I779 Disorder of arteries and arterioles, unspecified: Secondary | ICD-10-CM

## 2023-10-12 MED ORDER — FUROSEMIDE 20 MG PO TABS: 20.0000 mg | ORAL_TABLET | Freq: Every day | ORAL | 1 refills | Status: DC

## 2023-10-12 NOTE — Patient Instructions (Addendum)
 Medication Instructions:  HOLD YOUR ASPIRIN 7 DAYS PRIOR TO PROCEDURE You can take an additional Lasix as needed for shortness of breath or swelling. *If you need a refill on your cardiac medications before your next appointment, please call your pharmacy*   Lab Work: None Ordered If you have labs (blood work) drawn today and your tests are completely normal, you will receive your results only by: MyChart Message (if you have MyChart) OR A paper copy in the mail If you have any lab test that is abnormal or we need to change your treatment, we will call you to review the results.   Testing/Procedures: None ordered   Follow-Up: At [facility], you and your health needs are our priority.  As part of our continuing mission to provide you with exceptional heart care, we have created designated Provider Care Teams.  These Care Teams include your primary Cardiologist (physician) and Advanced Practice Providers (APPs -  Physician Assistants and Nurse Practitioners) who all work together to provide you with the care you need, when you need it.  We recommend signing up for the patient portal called "MyChart".  Sign up information is provided on this After Visit Summary.  MyChart is used to connect with patients for Virtual Visits (Telemedicine).  Patients are able to view lab/test results, encounter notes, upcoming appointments, etc.  Non-urgent messages can be sent to your provider as well.   To learn more about what you can do with MyChart, go to [email or web address].    Your next appointment:   6 month(s)  Provider:   Elder Negus, MD     Other Instructions

## 2023-10-14 ENCOUNTER — Other Ambulatory Visit: Payer: Self-pay | Admitting: Internal Medicine

## 2023-10-14 DIAGNOSIS — E785 Hyperlipidemia, unspecified: Secondary | ICD-10-CM

## 2023-10-25 ENCOUNTER — Telehealth: Payer: Self-pay | Admitting: Internal Medicine

## 2023-10-25 ENCOUNTER — Other Ambulatory Visit: Payer: Self-pay | Admitting: Internal Medicine

## 2023-10-25 DIAGNOSIS — Z794 Long term (current) use of insulin: Secondary | ICD-10-CM

## 2023-10-25 NOTE — Telephone Encounter (Signed)
 Copied from CRM [id number]. Topic: Referral - Request for Referral >> Oct 25, 2023  1:51 PM Fredrich Romans wrote: Did the patient discuss referral with their provider in the last year? Yes (If No - schedule appointment) (If Yes - send message)  Appointment offered? No  Type of order/referral and detailed reason for visit: podiatrist-ingrown toenails  Preference of office, provider, location: [place] area in [place]   If referral order, have you been seen by this specialty before? No (If Yes, this issue or another issue? When? Where?  Can we respond through MyChart? No

## 2023-10-26 NOTE — Telephone Encounter (Signed)
Can you place this referral?

## 2023-10-30 ENCOUNTER — Other Ambulatory Visit: Payer: Self-pay | Admitting: Urology

## 2023-10-31 ENCOUNTER — Other Ambulatory Visit: Payer: Self-pay | Admitting: Internal Medicine

## 2023-10-31 DIAGNOSIS — L602 Onychogryphosis: Secondary | ICD-10-CM | POA: Insufficient documentation

## 2023-11-06 ENCOUNTER — Ambulatory Visit: Admitting: Podiatry

## 2023-11-16 ENCOUNTER — Encounter: Payer: Self-pay | Admitting: Internal Medicine

## 2023-11-16 ENCOUNTER — Ambulatory Visit (INDEPENDENT_AMBULATORY_CARE_PROVIDER_SITE_OTHER): Admitting: Internal Medicine

## 2023-11-16 VITALS — BP 142/80 | HR 67 | Temp 98.6°F | Resp 16 | Ht 65.0 in | Wt 233.4 lb

## 2023-11-16 DIAGNOSIS — Z0001 Encounter for general adult medical examination with abnormal findings: Secondary | ICD-10-CM

## 2023-11-16 DIAGNOSIS — R0609 Other forms of dyspnea: Secondary | ICD-10-CM

## 2023-11-16 DIAGNOSIS — Z23 Encounter for immunization: Secondary | ICD-10-CM

## 2023-11-16 DIAGNOSIS — Z794 Long term (current) use of insulin: Secondary | ICD-10-CM | POA: Diagnosis not present

## 2023-11-16 DIAGNOSIS — E119 Type 2 diabetes mellitus without complications: Secondary | ICD-10-CM | POA: Diagnosis not present

## 2023-11-16 DIAGNOSIS — I10 Essential (primary) hypertension: Secondary | ICD-10-CM

## 2023-11-16 DIAGNOSIS — K7469 Other cirrhosis of liver: Secondary | ICD-10-CM | POA: Diagnosis not present

## 2023-11-16 DIAGNOSIS — K635 Polyp of colon: Secondary | ICD-10-CM

## 2023-11-16 DIAGNOSIS — E782 Mixed hyperlipidemia: Secondary | ICD-10-CM

## 2023-11-16 DIAGNOSIS — R9431 Abnormal electrocardiogram [ECG] [EKG]: Secondary | ICD-10-CM

## 2023-11-16 DIAGNOSIS — M5135 Other intervertebral disc degeneration, thoracolumbar region: Secondary | ICD-10-CM | POA: Insufficient documentation

## 2023-11-16 DIAGNOSIS — D539 Nutritional anemia, unspecified: Secondary | ICD-10-CM

## 2023-11-16 DIAGNOSIS — D538 Other specified nutritional anemias: Secondary | ICD-10-CM

## 2023-11-16 DIAGNOSIS — Z0000 Encounter for general adult medical examination without abnormal findings: Secondary | ICD-10-CM | POA: Diagnosis not present

## 2023-11-16 DIAGNOSIS — E785 Hyperlipidemia, unspecified: Secondary | ICD-10-CM

## 2023-11-16 LAB — BASIC METABOLIC PANEL WITH GFR
BUN: 12 mg/dL (ref 6–23)
CO2: 25 meq/L (ref 19–32)
Calcium: 9.5 mg/dL (ref 8.4–10.5)
Chloride: 107 meq/L (ref 96–112)
Creatinine, Ser: 0.9 mg/dL (ref 0.40–1.50)
GFR: 86.39 mL/min (ref >60.00)
Glucose, Bld: 79 mg/dL (ref 70–99)
Potassium: 3.6 meq/L (ref 3.5–5.1)
Sodium: 141 meq/L (ref 135–145)

## 2023-11-16 LAB — HEPATIC FUNCTION PANEL
ALT: 11 U/L (ref 0–53)
AST: 14 U/L (ref 0–37)
Albumin: 4.2 g/dL (ref 3.5–5.2)
Alkaline Phosphatase: 90 U/L (ref 39–117)
Bilirubin, Direct: 0.3 mg/dL (ref 0.0–0.3)
Total Bilirubin: 0.8 mg/dL (ref 0.2–1.2)
Total Protein: 7.2 g/dL (ref 6.0–8.3)

## 2023-11-16 LAB — IBC + FERRITIN
Ferritin: 28.2 ng/mL (ref 22.0–322.0)
Iron: 61 ug/dL (ref 42–165)
Saturation Ratios: 15 % — ABNORMAL LOW (ref 20.0–50.0)
TIBC: 406 ug/dL (ref 250.0–450.0)
Transferrin: 290 mg/dL (ref 212.0–360.0)

## 2023-11-16 LAB — CBC WITH DIFFERENTIAL/PLATELET
Basophils Absolute: 0.1 10*3/uL (ref 0.0–0.1)
Basophils Relative: 0.7 % (ref 0.0–3.0)
Eosinophils Absolute: 0.4 10*3/uL (ref 0.0–0.7)
Eosinophils Relative: 4.9 % (ref 0.0–5.0)
HCT: 37.6 % — ABNORMAL LOW (ref 39.0–52.0)
Hemoglobin: 12.7 g/dL — ABNORMAL LOW (ref 13.0–17.0)
Lymphocytes Relative: 22.6 % (ref 12.0–46.0)
Lymphs Abs: 1.7 10*3/uL (ref 0.7–4.0)
MCHC: 33.8 g/dL (ref 30.0–36.0)
MCV: 87.2 fl (ref 78.0–100.0)
Monocytes Absolute: 0.5 10*3/uL (ref 0.1–1.0)
Monocytes Relative: 7.4 % (ref 3.0–12.0)
Neutro Abs: 4.7 10*3/uL (ref 1.4–7.7)
Neutrophils Relative %: 64.4 % (ref 43.0–77.0)
Platelets: 250 10*3/uL (ref 150.0–400.0)
RBC: 4.31 Mil/uL (ref 4.22–5.81)
RDW: 14.5 % (ref 11.5–15.5)
WBC: 7.3 10*3/uL (ref 4.0–10.5)

## 2023-11-16 LAB — LIPID PANEL
Cholesterol: 70 mg/dL (ref 0–200)
HDL: 36.7 mg/dL — ABNORMAL LOW (ref >39.00)
LDL Cholesterol: 16 mg/dL (ref 0–99)
NonHDL: 32.84
Total CHOL/HDL Ratio: 2
Triglycerides: 85 mg/dL (ref 0.0–149.0)
VLDL: 17 mg/dL (ref 0.0–40.0)

## 2023-11-16 LAB — PROTIME-INR
INR: 1.1 ratio — ABNORMAL HIGH (ref 0.8–1.0)
Prothrombin Time: 11.6 s (ref 9.6–13.1)

## 2023-11-16 LAB — BRAIN NATRIURETIC PEPTIDE: Pro B Natriuretic peptide (BNP): 28 pg/mL (ref 0.0–100.0)

## 2023-11-16 LAB — TSH: TSH: 0.67 u[IU]/mL (ref 0.35–5.50)

## 2023-11-16 LAB — HEMOGLOBIN A1C: Hgb A1c MFr Bld: 6.8 % — ABNORMAL HIGH (ref 4.6–6.5)

## 2023-11-16 LAB — FOLATE: Folate: 16.9 ng/mL (ref >5.9)

## 2023-11-16 LAB — VITAMIN B12: Vitamin B-12: 410 pg/mL (ref 211–911)

## 2023-11-16 LAB — TROPONIN I (HIGH SENSITIVITY): High Sens Troponin I: 6 ng/L (ref 2–17)

## 2023-11-16 MED ORDER — SHINGRIX 50 MCG/0.5ML IM SUSR: 0.5000 mL | Freq: Once | INTRAMUSCULAR | 1 refills | Status: AC

## 2023-11-16 NOTE — Progress Notes (Unsigned)
 Subjective:  Patient ID: Bradley Hull, male    DOB: November 11, 1950  Age: 73 y.o. MRN: [id number]  CC: Anemia, Diabetes, Annual Exam, Hypertension, and Hyperlipidemia   HPI Bradley Hull presents for a CPX and f/up ---  Discussed the use of AI scribe software for clinical note transcription with the patient, who gave verbal consent to proceed.  History of Present Illness   Bradley Hull is a 73 year old male who presents with urinary issues and fatigue.  He has been experiencing ongoing bladder spasms, leading to frequent urination every fifteen minutes throughout the night. To manage this, he has been using a urinary catheter for the past two weeks to help him rest at night. He is scheduled for a procedure on May 2nd to further investigate his bladder issues.  He mentions fluctuations in his blood sugar levels, attributing this to dietary habits. He uses a blood sugar meter to monitor his levels. He has a history of being advised to undergo back surgery in 2019 due to a cracked spine and other spinal issues. He experiences constipation and diarrhea, with urine color varying from clear to dark brown.  He reports shortness of breath over the past month, but no dizziness. He recently visited a cardiologist who prescribed a diuretic. He notes a significant weight change, dropping from 250 pounds to 234 pounds within a week, and occasionally uses laxatives for constipation.  He experiences fatigue and has difficulty maintaining his apartment. He also reports numbness in his hands and feet, describing it as 'kind of like an itch that you can't scratch.' He has a history of vein issues and swelling.       Outpatient Medications Prior to Visit  Medication Sig Dispense Refill   ACCU-CHEK GUIDE TEST test strip USE 1 STRIP TO CHECK GLUCOSE IN THE MORNING AND AT NOON AND AT BEDTIME. USE AS INSTRUCTED. 300 each 0   amLODipine  (NORVASC ) 10 MG tablet Take 1 tablet by mouth once daily 90 tablet 0    aspirin  EC 81 MG tablet Take 81 mg by mouth daily. Swallow whole.     atorvastatin  (LIPITOR) 80 MG tablet Take 1 tablet (80 mg total) by mouth at bedtime. Schedule an appt for further refills 60 tablet 0   Blood Glucose Monitoring Suppl (ACCU-CHEK GUIDE ME) w/Device KIT Inject 1 Act into the skin 3 (three) times daily. USE AS DIRECTED 1 kit 1   buprenorphine (SUBUTEX) 2 MG SUBL SL tablet Place 2 mg under the tongue 2 (two) times daily.     carvedilol  (COREG ) 6.25 MG tablet TAKE 1 TABLET BY MOUTH TWICE DAILY WITH A MEAL 180 tablet 0   Continuous Glucose Receiver (FREESTYLE LIBRE 2 READER) DEVI Use as directed to check blood sugars 2 each 5   Continuous Glucose Sensor (FREESTYLE LIBRE 2 SENSOR) MISC USE AS DIRECTED 2 each 3   furosemide  (LASIX ) 20 MG tablet Take 1 tablet (20 mg total) by mouth daily. 90 tablet 1   omeprazole  (PRILOSEC) 40 MG capsule Use Prilosec (omeprazole ) 40 mg PO BID for 6 weeks to promote mucosal healing, then reduce to 40 mg daily and continue to titrate to lowest effective dose if abdominal symptoms resolved. (Patient taking differently: 40 mg as needed (heartburn). Use Prilosec (omeprazole ) 40 mg reduce to 40 mg) 180 capsule 1   SOLIQUA  100-33 UNT-MCG/ML SOPN INJECT 20 UNITS SUBCUTANEOUSLY ONCE DAILY 18 mL 0   tamsulosin  (FLOMAX ) 0.4 MG CAPS capsule as needed (hard to urinate).  VENTOLIN HFA 108 (90 Base) MCG/ACT inhaler as needed for shortness of breath or wheezing.     zinc  gluconate 50 MG tablet Take 1 tablet (50 mg total) by mouth daily. 90 tablet 1   No facility-administered medications prior to visit.    ROS Review of Systems  Objective:  BP (!) 142/80 (BP Location: Left Arm, Patient Position: Sitting, Cuff Size: Normal)   Pulse 67   Temp 98.6 F (37 C) (Temporal)   Resp 16   Ht 5\' 5"  (1.651 m)   Wt 233 lb 6.4 oz (105.9 kg)   SpO2 96%   BMI 38.84 kg/m   BP Readings from Last 3 Encounters:  11/16/23 (!) 142/80  10/12/23 110/70  04/10/23 128/82     Wt Readings from Last 3 Encounters:  11/16/23 233 lb 6.4 oz (105.9 kg)  10/12/23 249 lb 9.6 oz (113.2 kg)  04/10/23 231 lb (104.8 kg)    Physical Exam  Lab Results  Component Value Date   WBC 5.8 04/10/2023   HGB 12.6 (L) 04/10/2023   HCT 38.8 (L) 04/10/2023   PLT 264.0 04/10/2023   GLUCOSE 140 (H) 04/10/2023   CHOL 78 (L) 12/21/2022   TRIG 42 12/21/2022   HDL 47 12/21/2022   LDLCALC 19 12/21/2022   ALT 14 04/10/2023   AST 19 04/10/2023   NA 142 04/10/2023   K 3.7 04/10/2023   CL 106 04/10/2023   CREATININE 0.86 04/10/2023   BUN 11 04/10/2023   CO2 28 04/10/2023   TSH 1.03 02/04/2021   PSA 0.1 11/17/2022   INR 1.0 04/10/2023   HGBA1C 6.8 (H) 04/10/2023   MICROALBUR 2.2 (H) 04/10/2023    US  Abdomen Limited RUQ (LIVER/GB) Result Date: 08/17/2023 CLINICAL DATA:  Cirrhosis EXAM: ULTRASOUND ABDOMEN LIMITED RIGHT UPPER QUADRANT COMPARISON:  Ultrasound limited 02/08/2023 FINDINGS: Gallbladder: No gallstones or wall thickening visualized. No sonographic Murphy sign noted by sonographer. Common bile duct: Diameter: 3.4 mm Liver: Increased echogenicity. No focal lesion. Portal vein is patent on color Doppler imaging with normal direction of blood flow towards the liver. Other: 3.2 cm simple cyst right kidney. IMPRESSION: 1. Increased hepatic parenchymal echogenicity suggestive of steatosis. 2. No cholelithiasis or sonographic evidence for acute cholecystitis. Electronically Signed   By: Jone Neither M.D.   On: 08/17/2023 12:49    Assessment & Plan:  Essential hypertension -     TSH; Future -     Basic metabolic panel with GFR; Future  Encounter for general adult medical examination with abnormal findings  Deficiency anemia -     IBC + Ferritin; Future -     Zinc ; Future -     Vitamin B1; Future -     Folate; Future -     CBC with Differential/Platelet; Future -     Reticulocytes; Future -     Vitamin B12; Future  Other cirrhosis of liver (HCC) -     Hepatic function  panel; Future -     Protime-INR; Future  Insulin -requiring or dependent type II diabetes mellitus (HCC) -     Hemoglobin A1c; Future -     Basic metabolic panel with GFR; Future  Class 2 severe obesity due to excess calories with serious comorbidity and body mass index (BMI) of 37.0 to 37.9 in adult [facility])  Mixed hyperlipidemia -     Lipid panel; Future -     Hepatic function panel; Future  Polyp of colon, unspecified part of colon, unspecified type -  Ambulatory referral to Gastroenterology  DOE (dyspnea on exertion) -     EKG 12-Lead -     Troponin I (High Sensitivity); Future -     Brain natriuretic peptide; Future -     CT CORONARY MORPH W/CTA COR W/SCORE W/CA W/CM &/OR WO/CM; Future  Abnormal EKG -     Troponin I (High Sensitivity); Future -     Brain natriuretic peptide; Future -     CT CORONARY MORPH W/CTA COR W/SCORE W/CA W/CM &/OR WO/CM; Future  Need for prophylactic vaccination and inoculation against varicella -     Shingrix ; Inject 0.5 mLs into the muscle once for 1 dose.  Dispense: 0.5 mL; Refill: 1     Follow-up: Return in about 3 months (around 02/15/2024).  Sandra Crouch, MD

## 2023-11-16 NOTE — Patient Instructions (Signed)
 Health Maintenance, Male  Adopting a healthy lifestyle and getting preventive care are important in promoting health and wellness. Ask your health care provider about:  The right schedule for you to have regular tests and exams.  Things you can do on your own to prevent diseases and keep yourself healthy.  What should I know about diet, weight, and exercise?  Eat a healthy diet    Eat a diet that includes plenty of vegetables, fruits, low-fat dairy products, and lean protein.  Do not eat a lot of foods that are high in solid fats, added sugars, or sodium.  Maintain a healthy weight  Body mass index (BMI) is a measurement that can be used to identify possible weight problems. It estimates body fat based on height and weight. Your health care provider can help determine your BMI and help you achieve or maintain a healthy weight.  Get regular exercise  Get regular exercise. This is one of the most important things you can do for your health. Most adults should:  Exercise for at least 150 minutes each week. The exercise should increase your heart rate and make you sweat (moderate-intensity exercise).  Do strengthening exercises at least twice a week. This is in addition to the moderate-intensity exercise.  Spend less time sitting. Even light physical activity can be beneficial.  Watch cholesterol and blood lipids  Have your blood tested for lipids and cholesterol at 71 years of age, then have this test every 5 years.  You may need to have your cholesterol levels checked more often if:  Your lipid or cholesterol levels are high.  You are older than 71 years of age.  You are at high risk for heart disease.  What should I know about cancer screening?  Many types of cancers can be detected early and may often be prevented. Depending on your health history and family history, you may need to have cancer screening at various ages. This may include screening for:  Colorectal cancer.  Prostate cancer.  Skin cancer.  Lung  cancer.  What should I know about heart disease, diabetes, and high blood pressure?  Blood pressure and heart disease  High blood pressure causes heart disease and increases the risk of stroke. This is more likely to develop in people who have high blood pressure readings or are overweight.  Talk with your health care provider about your target blood pressure readings.  Have your blood pressure checked:  Every 3-5 years if you are 9-95 years of age.  Every year if you are 85 years old or older.  If you are between the ages of 29 and 29 and are a current or former smoker, ask your health care provider if you should have a one-time screening for abdominal aortic aneurysm (AAA).  Diabetes  Have regular diabetes screenings. This checks your fasting blood sugar level. Have the screening done:  Once every three years after age 23 if you are at a normal weight and have a low risk for diabetes.  More often and at a younger age if you are overweight or have a high risk for diabetes.  What should I know about preventing infection?  Hepatitis B  If you have a higher risk for hepatitis B, you should be screened for this virus. Talk with your health care provider to find out if you are at risk for hepatitis B infection.  Hepatitis C  Blood testing is recommended for:  Everyone born from [birth year] through 1963.  Anyone  with known risk factors for hepatitis C.  Sexually transmitted infections (STIs)  You should be screened each year for STIs, including gonorrhea and chlamydia, if:  You are sexually active and are younger than 71 years of age.  You are older than 71 years of age and your health care provider tells you that you are at risk for this type of infection.  Your sexual activity has changed since you were last screened, and you are at increased risk for chlamydia or gonorrhea. Ask your health care provider if you are at risk.  Ask your health care provider about whether you are at high risk for HIV. Your health care provider  may recommend a prescription medicine to help prevent HIV infection. If you choose to take medicine to prevent HIV, you should first get tested for HIV. You should then be tested every 3 months for as long as you are taking the medicine.  Follow these instructions at home:  Alcohol use  Do not drink alcohol if your health care provider tells you not to drink.  If you drink alcohol:  Limit how much you have to 0-2 drinks a day.  Know how much alcohol is in your drink. In the [place] one drink equals one 12 oz bottle of beer (355 mL), one 5 oz glass of wine (148 mL), or one 1 oz glass of hard liquor (44 mL).  Lifestyle  Do not use any products that contain nicotine or tobacco. These products include cigarettes, chewing tobacco, and vaping devices, such as e-cigarettes. If you need help quitting, ask your health care provider.  Do not use street drugs.  Do not share needles.  Ask your health care provider for help if you need support or information about quitting drugs.  General instructions  Schedule regular health, dental, and eye exams.  Stay current with your vaccines.  Tell your health care provider if:  You often feel depressed.  You have ever been abused or do not feel safe at home.  Summary  Adopting a healthy lifestyle and getting preventive care are important in promoting health and wellness.  Follow your health care provider's instructions about healthy diet, exercising, and getting tested or screened for diseases.  Follow your health care provider's instructions on monitoring your cholesterol and blood pressure.  This information is not intended to replace advice given to you by your health care provider. Make sure you discuss any questions you have with your health care provider.  Document Revised: 11/30/2020 Document Reviewed: 11/30/2020  Elsevier Patient Education  2024 [employer].

## 2023-11-17 MED ORDER — ATORVASTATIN CALCIUM 80 MG PO TABS: 80.0000 mg | ORAL_TABLET | Freq: Every day | ORAL | 0 refills | Status: DC

## 2023-11-17 MED ORDER — SOLIQUA 100-33 UNT-MCG/ML ~~LOC~~ SOPN: 20.0000 [IU] | PEN_INJECTOR | Freq: Every day | SUBCUTANEOUS | 0 refills | Status: DC

## 2023-11-17 MED ORDER — ZINC GLUCONATE 50 MG PO TABS: 50.0000 mg | ORAL_TABLET | Freq: Every day | ORAL | 1 refills | Status: DC

## 2023-11-20 ENCOUNTER — Other Ambulatory Visit: Payer: Self-pay | Admitting: Internal Medicine

## 2023-11-20 DIAGNOSIS — E519 Thiamine deficiency, unspecified: Secondary | ICD-10-CM

## 2023-11-20 DIAGNOSIS — D538 Other specified nutritional anemias: Secondary | ICD-10-CM | POA: Insufficient documentation

## 2023-11-20 LAB — RETICULOCYTES
ABS Retic: 51360 {cells}/uL (ref 25000–90000)
Retic Ct Pct: 1.2 %

## 2023-11-20 LAB — VITAMIN B1: Vitamin B1 (Thiamine): 7 nmol/L — ABNORMAL LOW (ref 8–30)

## 2023-11-20 LAB — ZINC: Zinc: 65 ug/dL (ref 60–130)

## 2023-11-20 MED ORDER — VITAMIN B-1 50 MG PO TABS: 50.0000 mg | ORAL_TABLET | Freq: Every day | ORAL | 1 refills | Status: DC

## 2023-11-21 ENCOUNTER — Telehealth: Payer: Self-pay | Admitting: Internal Medicine

## 2023-11-21 ENCOUNTER — Encounter: Payer: Self-pay | Admitting: Internal Medicine

## 2023-11-21 NOTE — Telephone Encounter (Signed)
 Copied from CRM [id number]. Topic: Clinical - Lab/Test Results >> Nov 21, 2023 11:04 AM Earnestine Goes B wrote: Reason for CRM: pt called to follow up on lab results. Pt is requesting a  call back at [phone]

## 2023-11-21 NOTE — Telephone Encounter (Signed)
 Copied from CRM [id number]. Topic: Referral - Question >> Nov 21, 2023  1:21 PM Alyse July wrote: Reason for CRM: Patient called to check the status of a referral that was suppose to be place by provider to have someone assist with cleaning patient home. No referral located please contact patient to further advise [phone].

## 2023-11-24 NOTE — Telephone Encounter (Signed)
 Unable to reach patient. LMTRC

## 2023-11-24 NOTE — Telephone Encounter (Signed)
Please comment on labs.

## 2023-11-29 ENCOUNTER — Encounter (HOSPITAL_COMMUNITY): Payer: Self-pay | Admitting: Urology

## 2023-11-29 ENCOUNTER — Other Ambulatory Visit: Payer: Self-pay | Admitting: Internal Medicine

## 2023-11-29 DIAGNOSIS — E119 Type 2 diabetes mellitus without complications: Secondary | ICD-10-CM

## 2023-11-29 NOTE — Telephone Encounter (Signed)
 Patient has been made aware. And Received the letter in the mail sent from Dr. Rochelle Chu

## 2023-11-29 NOTE — Progress Notes (Signed)
 Attempted to obtain medical history via telephone, unable to reach at this time. HIPAA compliant voicemail message left requesting return call to pre surgical testing department.

## 2023-11-29 NOTE — Telephone Encounter (Signed)
 Copied from CRM [id number]. Topic: General - Other >> Nov 29, 2023  8:26 AM Bradley Hull wrote: Reason for CRM: Patient would like to follow up on his request regarding a nurse or someone coming out to help him in the day time to help him clean. Stated he spoke with Dr. Rochelle Chu about it at last appointment but has not heard anything else regarding it. Please call [phone]

## 2023-11-29 NOTE — Progress Notes (Signed)
 Spoke to Bradley Hull and he stated that he is going to reschedule surgery for 12-01-23 because he has to get his heart checked out due to cardiac issues.  Rhonda at Dr. Vida Gravely office notified.

## 2023-11-29 NOTE — Progress Notes (Signed)
 Spoke to patient's significant other, Brian Campanile, who stated that she would have patient return call regarding surgery on 12-01-23.

## 2023-11-29 NOTE — Telephone Encounter (Signed)
Can you place this referral?

## 2023-11-30 ENCOUNTER — Telehealth: Payer: Self-pay | Admitting: *Deleted

## 2023-11-30 ENCOUNTER — Other Ambulatory Visit: Payer: Self-pay | Admitting: Internal Medicine

## 2023-11-30 DIAGNOSIS — K7469 Other cirrhosis of liver: Secondary | ICD-10-CM

## 2023-11-30 DIAGNOSIS — F1994 Other psychoactive substance use, unspecified with psychoactive substance-induced mood disorder: Secondary | ICD-10-CM

## 2023-11-30 NOTE — Progress Notes (Signed)
 Complex Care Management Note Care Guide Note  11/30/2023 Name: Bradley Hull MRN: [id number] DOB: Feb 22, 1951   Complex Care Management Outreach Attempts: An unsuccessful telephone outreach was attempted today to offer the patient information about available complex care management services.  Follow Up Plan:  Additional outreach attempts will be made to offer the patient complex care management information and services.   Encounter Outcome:  No Answer  Kandis Ormond, CMA [place]  [facility], [facility] Guide Direct Dial: [phone]  Fax: [phone] Website: [email or web address]

## 2023-12-01 ENCOUNTER — Telehealth: Payer: Self-pay | Admitting: Cardiology

## 2023-12-01 ENCOUNTER — Ambulatory Visit (HOSPITAL_COMMUNITY): Admission: RE | Admit: 2023-12-01 | Source: Home / Self Care | Admitting: Urology

## 2023-12-01 ENCOUNTER — Encounter (HOSPITAL_COMMUNITY): Admission: RE | Payer: Self-pay | Source: Home / Self Care

## 2023-12-01 HISTORY — DX: Unspecified atrial fibrillation: I48.91

## 2023-12-01 SURGERY — TURP (TRANSURETHRAL RESECTION OF PROSTATE)
Anesthesia: General

## 2023-12-01 NOTE — Telephone Encounter (Signed)
 We didn't order the test. His internal medicine doctor ordered it.

## 2023-12-01 NOTE — Progress Notes (Unsigned)
 Complex Care Management Note Care Guide Note  12/01/2023 Name: Bradley Hull MRN: [id number] DOB: 1951-03-04   Complex Care Management Outreach Attempts: A second unsuccessful outreach was attempted today to offer the patient with information about available complex care management services.  Follow Up Plan:  Additional outreach attempts will be made to offer the patient complex care management information and services.   Encounter Outcome:  No Answer  .Kandis Ormond, CMA [place]  [facility], [facility] Guide Direct Dial: [phone]  Fax: [phone] Website: [email or web address]

## 2023-12-01 NOTE — Telephone Encounter (Signed)
 Patient is calling requesting a callback to discuss CT prep instructions. He also states he used to be a IV drug user, so an experienced phlebotomist will be needed for the IV due to him being a hard stick.  Please advise.

## 2023-12-04 NOTE — Progress Notes (Signed)
 Complex Care Management Note Care Guide Note  12/04/2023 Name: Bradley Hull MRN: [id number] DOB: 1950/12/03   Complex Care Management Outreach Attempts: A third unsuccessful outreach was attempted today to offer the patient with information about available complex care management services.  Follow Up Plan:  No further outreach attempts will be made at this time. We have been unable to contact the patient to offer or enroll patient in complex care management services.  Encounter Outcome:  No Answer  Kandis Ormond, CMA [place]  [facility], [facility] Guide Direct Dial: [phone]  Fax: [phone] Website: [email or web address]

## 2023-12-05 ENCOUNTER — Telehealth: Payer: Self-pay | Admitting: Internal Medicine

## 2023-12-05 NOTE — Telephone Encounter (Signed)
 Copied from CRM [id number]. Topic: Referral - Question >> Dec 05, 2023 10:47 AM Yolande Hench C wrote: Reason for CRM: Patient has requested for the PCP to change his Gastroenterology referral to a gastroenterologists in [place], [place]; patient has requested a follow up call after the referral has been updated #[phone]

## 2023-12-06 NOTE — Telephone Encounter (Signed)
 May you please change his referral

## 2023-12-07 ENCOUNTER — Telehealth (HOSPITAL_COMMUNITY): Payer: Self-pay | Admitting: *Deleted

## 2023-12-07 NOTE — Telephone Encounter (Signed)
 Attempted to call patient regarding upcoming cardiac CT appointment. Left message on voicemail with name and callback number  Larey Brick RN Navigator Cardiac Imaging [facility] [phone] Office [phone] Cell

## 2023-12-08 ENCOUNTER — Ambulatory Visit (HOSPITAL_COMMUNITY)
Admission: RE | Admit: 2023-12-08 | Discharge: 2023-12-08 | Disposition: A | Discharge status: Home / Self Care | Source: Ambulatory Visit | Attending: Internal Medicine | Admitting: Internal Medicine

## 2023-12-08 DIAGNOSIS — R0609 Other forms of dyspnea: Secondary | ICD-10-CM | POA: Diagnosis present

## 2023-12-08 DIAGNOSIS — R9431 Abnormal electrocardiogram [ECG] [EKG]: Secondary | ICD-10-CM | POA: Insufficient documentation

## 2023-12-08 MED ORDER — IOHEXOL 350 MG/ML SOLN
100.0000 mL | Freq: Once | INTRAVENOUS | Status: AC | PRN
  Administered 2023-12-08: 100 mL via INTRAVENOUS

## 2023-12-08 MED ORDER — NITROGLYCERIN 0.4 MG SL SUBL
SUBLINGUAL_TABLET | SUBLINGUAL | Status: AC
Start: 2023-12-08 — End: ?
  Filled 2023-12-08: qty 3

## 2023-12-08 MED ORDER — NITROGLYCERIN 0.4 MG SL SUBL
0.8000 mg | SUBLINGUAL_TABLET | Freq: Once | SUBLINGUAL | Status: AC
  Administered 2023-12-08: 0.8 mg via SUBLINGUAL

## 2023-12-11 ENCOUNTER — Ambulatory Visit: Payer: Self-pay | Admitting: Internal Medicine

## 2023-12-11 ENCOUNTER — Telehealth: Payer: Self-pay

## 2023-12-11 ENCOUNTER — Other Ambulatory Visit: Payer: Self-pay | Admitting: Internal Medicine

## 2023-12-11 DIAGNOSIS — Q211 Atrial septal defect, unspecified: Secondary | ICD-10-CM | POA: Insufficient documentation

## 2023-12-11 DIAGNOSIS — Q2112 Patent foramen ovale: Secondary | ICD-10-CM | POA: Insufficient documentation

## 2023-12-11 NOTE — Telephone Encounter (Signed)
 Copied from CRM [id number]. Topic: Clinical - Lab/Test Results >> Dec 11, 2023  8:22 AM Yolande Hench C wrote: Reason for CRM: Patient has requested a call back from PCP or nurse to discuss his imagining results from 12/08/2023; please follow up with patient [phone]

## 2023-12-12 NOTE — Telephone Encounter (Signed)
 Patient has been made aware of his imaging results. Gave a verbal understanding.

## 2023-12-12 NOTE — Telephone Encounter (Signed)
 Copied from CRM [id number]. Topic: Clinical - Lab/Test Results >> Dec 12, 2023 12:05 PM Albertha Alosa wrote: Patient called in wanting to know the results from Friday

## 2023-12-12 NOTE — Telephone Encounter (Signed)
 Copied from CRM [id number]. Topic: Clinical - Lab/Test Results >> Dec 11, 2023  8:22 AM Bradley Hull wrote: Reason for CRM: Patient has requested a call back from PCP or nurse to discuss his imagining results from 12/08/2023; please follow up with patient [phone] >> Dec 11, 2023  5:01 PM Bradley Hull wrote: Patient called to see if the test results are available from 12/08/23 informed patient no new notes listed and the office has been made aware that he wants a call back about the results

## 2023-12-14 ENCOUNTER — Other Ambulatory Visit: Payer: Self-pay | Admitting: Internal Medicine

## 2023-12-14 DIAGNOSIS — I10 Essential (primary) hypertension: Secondary | ICD-10-CM

## 2023-12-20 ENCOUNTER — Other Ambulatory Visit: Payer: Self-pay | Admitting: Internal Medicine

## 2023-12-20 DIAGNOSIS — I10 Essential (primary) hypertension: Secondary | ICD-10-CM

## 2023-12-21 ENCOUNTER — Ambulatory Visit: Attending: Emergency Medicine | Admitting: Emergency Medicine

## 2023-12-21 ENCOUNTER — Encounter: Payer: Self-pay | Admitting: Emergency Medicine

## 2023-12-21 VITALS — BP 130/78 | HR 68 | Ht 66.0 in | Wt 235.0 lb

## 2023-12-21 DIAGNOSIS — E1169 Type 2 diabetes mellitus with other specified complication: Secondary | ICD-10-CM | POA: Diagnosis not present

## 2023-12-21 DIAGNOSIS — I10 Essential (primary) hypertension: Secondary | ICD-10-CM | POA: Diagnosis not present

## 2023-12-21 DIAGNOSIS — E785 Hyperlipidemia, unspecified: Secondary | ICD-10-CM

## 2023-12-21 DIAGNOSIS — I2510 Atherosclerotic heart disease of native coronary artery without angina pectoris: Secondary | ICD-10-CM

## 2023-12-21 DIAGNOSIS — I779 Disorder of arteries and arterioles, unspecified: Secondary | ICD-10-CM

## 2023-12-21 DIAGNOSIS — Q211 Atrial septal defect, unspecified: Secondary | ICD-10-CM | POA: Diagnosis not present

## 2023-12-21 DIAGNOSIS — I639 Cerebral infarction, unspecified: Secondary | ICD-10-CM

## 2023-12-21 NOTE — Patient Instructions (Signed)
 Medication Instructions:  NO CHANGES   Lab Work: NONE   Testing/Procedures: NONE  Follow-Up: At [facility], you and your health needs are our priority.  As part of our continuing mission to provide you with exceptional heart care, our providers are all part of one team.  This team includes your primary Cardiologist (physician) and Advanced Practice Providers or APPs (Physician Assistants and Nurse Practitioners) who all work together to provide you with the care you need, when you need it.  Your next appointment:   5-6 MONTHS  Provider:   Cody Das, MD    We recommend signing up for the patient portal called "MyChart".  Sign up information is provided on this After Visit Summary.  MyChart is used to connect with patients for Virtual Visits (Telemedicine).  Patients are able to view lab/test results, encounter notes, upcoming appointments, etc.  Non-urgent messages can be sent to your provider as well.   To learn more about what you can do with MyChart, go to [email or web address].

## 2023-12-21 NOTE — Progress Notes (Addendum)
 Cardiology Office Note:    Date:  12/21/2023  ID:  Bradley Hull, DOB [birth year], MRN [id number] PCP: Arcadio Knuckles, MD  [facility] Providers Cardiologist:  Cody Das, MD       Patient Profile:       Chief Complaint: Follow-up for abnormal results on CT scan History of Present Illness:  Bradley Hull is a 73 y.o. male with visit-pertinent history of hypertension, type 2 diabetes, tobacco abuse, alcohol abuse, hepatitis C with cirrhosis of esophageal varices, OSA, obesity, prostate cancer, right acute ischemic CVA on 05/2021, carotid artery disease  She was initially seen by cardiology service in 2022 for irregular heartbeat and coronary artery disease.  He was undergoing a colonoscopy when he became bradycardic and hypotensive with new onset atrial fibrillation noted on tracings.  He placed on 2-week monitor to observe for AF that showed no episodes of AF/a flutter/VT/high-grade AV block.  He also completed 2D echo that showed LVEF of 60% with mild LVH and no TR or significant valvular abnormalities.  He suffered an acute ischemic right MCA stroke on 06/23/2021 with right amaurosis fugax correlating to right ICA stenosis.  He was not a candidate for tPA and infarcts appeared not embolic.  He was also found to have complete occlusion of the right internal carotid artery that was treated medically.  He was admitted 08/20/2020 found to have a large symptomatic pericardial effusion with tamponade treated with pericardiocentesis.  He was last seen in office on 10/12/2023 by Renelda Carry, NP.  He presented for cardiac clearance for upcoming colonoscopy.  He was doing well at the time without acute cardiovascular concerns or complaints.  He was cleared for colonoscopy.  Since he was last seen by cardiology service his primary care provider had ordered a coronary CTA for dyspnea on exertion which was completed on 12/08/2023 showing minimal mixed nonobstructive CAD with coronary calcium   score 281 (80th percentile) and total plaque volume of 478 (13th percentile) with small ASD with left-to-right flow suspected.   Discussed the use of AI scribe software for clinical note transcription with the patient, who gave verbal consent to proceed.  History of Present Illness Bradley Hull is a 73 year old male who presents after abnormal coronary CTA showing CAD and ASD.  Today patient comes in the office and notes he is doing well overall.  He continues to experience dyspnea on exertion, which has remained unchanged over long period of time.  No increase in frequency or duration.  Notes that his DOE is stable.  He also has occasional chest discomfort in the past however denies any chest pain today.  Specifically denies any exertional chest pains.   He recently canceled his procedure for TURP as he was unsure about having the surgery completed.  He has been catheterized for over a month, with good urine output.  Family history reveals that his sister and her grandchildren also have atrial septal defects.  He is without any chest pain, SOB at rest, orthopnea, dyspnea, leg swelling, syncope, lightheadedness, dizziness.  Review of systems:  Please see the history of present illness. All other systems are reviewed and otherwise negative.      Studies Reviewed:        Coronary CTA 12/08/2023 1. Minimal mixed non-obstructive CAD, CADRADS = 1.   2. Normal coronary origin with right dominance.   3. Coronary artery calcium  score is 281, which places the patient in the 80th percentile for age/race and sex-matched controls (MESA).  4. Total plaque volume 470 mm3 which is 13th percentile for age- and sex-matched controls (calcified plaque 59 mm3; non-calcified plaque 411 mm3, of which 4 mm3 is low-attenuation). TPV is (severe).   5. Small ASD with left to right flow suspected. Correlate with echo bubble study if clinically relevant.   6. Aortic atherosclerosis.  Echocardiogram  03/02/2023 LV systolic function with EF 74%. Left ventricle cavity is normal in size.  Normal left ventricular wall thickness. Normal global wall motion. Normal  diastolic filling pattern, normal LAP. Calculated EF 74%.  No significant valvular abnormalities.  Risk Assessment/Calculations:              Physical Exam:   VS:  BP 130/78 (BP Location: Left Arm, Patient Position: Sitting, Cuff Size: Large)   Pulse 68   Ht 5\' 6"  (1.676 m)   Wt 235 lb (106.6 kg)   BMI 37.93 kg/m    Wt Readings from Last 3 Encounters:  12/21/23 235 lb (106.6 kg)  11/16/23 233 lb 6.4 oz (105.9 kg)  10/12/23 249 lb 9.6 oz (113.2 kg)    GEN: Well nourished, well developed in no acute distress NECK: No JVD; No carotid bruits CARDIAC: RRR, no murmurs, rubs, gallops RESPIRATORY:  Clear to auscultation without rales, wheezing or rhonchi  ABDOMEN: Soft, non-tender, non-distended EXTREMITIES:  No edema; No acute deformity      Assessment and Plan:  Atrial septal defect Coronary CTA 11/2023 showed small ASD with left-to-right flow suspected Prior echocardiograms 02/2023 and 10/2020 did not show ASD. He is on Lasix  to reduce his fluid overload as well as beta-blocker therapy - Will continue to clinically monitor - Will reach out to patient's primary cardiologist Dr. Filiberto Hug if further evaluation would be warranted - Addendum: Spoke with patient's primary cardiologist Dr. Filiberto Hug who recommended repeat echocardiogram to specifically evaluate for ASD - Echocardiogram order has been placed and scheduled for 01/04/2024  Coronary artery disease Coronary CTA 11/2023 with coronary calcium  score 281 (8th percentile) and total plaque volume 470 (13th percentile) - Today patient is without any anginal symptoms.  He denies any exertional chest pains.  Notes DOE has been ongoing and stable.  Dyspnea likely related to underlying COPD.  There is no indication for further ischemic evaluation at this time - Continue aspirin   81 mg daily, atorvastatin  80 mg daily, carvedilol  6.25 mg twice daily  Hypertension Blood pressure today is 130/78 and under excellent control - Maintain home BP monitoring and log - Continue amlodipine  10 mg daily and carvedilol  6.25 mg twice daily  Hyperlipidemia LDL 16, HDL 36, TG 85 on 10/2023 LDL under excellent control and under goal less than 55 - Continue atorvastatin  80 mg daily  History of CVA S/p ischemic right MCA stroke on 06/23/2021 with right amaurosis fugax correlating to right ICA stenosis - Today denies any residual symptoms and no new neurological symptoms noted  Chronic hepatitis C with cirrhosis Followed by [facility] liver care transplant  History of prostate cancer Pending TURP  Carotid artery disease Per Dr. Filiberto Hug on 11/2022 "I am not sure there is any intervention to be performed if has occluded Rt ICA, but intact Lt ICA. He has no stroke/TIA symptoms at this time. It would be reasonable to establish care with Neurology. Will defer the referral to PCP Dr. Rochelle Chu." - Today patient is without stroke/TIA symptoms - As noted above it will be reasonable to establish care with neurology, will defer referral to PCP - Continue aspirin  81 mg daily, atorvastatin   80 mg daily     Dispo:  Return in about 6 months (around 06/22/2024).  Signed, Ava Boatman, NP

## 2023-12-24 ENCOUNTER — Other Ambulatory Visit: Payer: Self-pay | Admitting: Internal Medicine

## 2023-12-24 DIAGNOSIS — I10 Essential (primary) hypertension: Secondary | ICD-10-CM

## 2023-12-28 ENCOUNTER — Other Ambulatory Visit: Payer: Self-pay | Admitting: Internal Medicine

## 2023-12-28 ENCOUNTER — Telehealth: Payer: Self-pay | Admitting: Cardiology

## 2023-12-28 DIAGNOSIS — I10 Essential (primary) hypertension: Secondary | ICD-10-CM

## 2023-12-28 NOTE — Telephone Encounter (Signed)
   Pre-operative Risk Assessment    Patient Name: Bradley Hull  DOB: May 27, 1951 MRN: [id number]   Date of last office visit: 12/21/2023  Date of next office visit: TBD   Request for Surgical Clearance    Procedure:  Transurethral resection of the prostate   Date of Surgery:  Clearance TBD                                Surgeon:  Dr. Valencia Gary Group or Practice Name:  [facility]  Phone number:  [phone] Ext: [phone] Fax number:  [phone]   Type of Clearance Requested:   - Medical  - Pharmacy:  Hold TBD by card      Type of Anesthesia:  General    Additional requests/questions:    SignedPrudence Brown   12/28/2023, 4:05 PM

## 2023-12-29 ENCOUNTER — Other Ambulatory Visit: Payer: Self-pay | Admitting: *Deleted

## 2023-12-29 DIAGNOSIS — Q211 Atrial septal defect, unspecified: Secondary | ICD-10-CM

## 2023-12-29 NOTE — Telephone Encounter (Signed)
   Patient Name: Bradley Hull  DOB: April 06, 1951 MRN: [id number]  Primary Cardiologist: Cody Das, MD  Chart reviewed as part of pre-operative protocol coverage. Given past medical history and time since last visit, based on ACC/AHA guidelines, Bradley Hull is at acceptable risk for the planned procedure without further cardiovascular testing.   Pt was seen in clinic on 12/21/2023 by Palmer Bobo, NP. Per Madision Renford Cartwright, NP, "He was able to complete greater than 4 METS without symptoms.  Okay to proceed with surgery."  Regarding ASA therapy, we recommend continuation of ASA throughout the perioperative period.  However, if the surgeon feels that cessation of ASA is required in the perioperative period, it may be stopped 5-7 days prior to surgery with a plan to resume it as soon as felt to be feasible from a surgical standpoint in the post-operative period.  I will route this recommendation to the requesting party via Epic fax function and remove from pre-op pool.  Please call with questions.  Jude Norton, NP 12/29/2023, 1:42 PM

## 2024-01-01 ENCOUNTER — Telehealth: Payer: Self-pay | Admitting: Emergency Medicine

## 2024-01-01 ENCOUNTER — Other Ambulatory Visit: Payer: Self-pay | Admitting: Urology

## 2024-01-01 NOTE — Telephone Encounter (Signed)
 New Message:    Patient said his surgeon office just said he was cleared for surgery. He said he is scheduled for an Echo on this Thursday(01-04-24). He said he thought he needed the Echo before his surgery, is this correct?

## 2024-01-01 NOTE — Telephone Encounter (Signed)
I will forward this to pre op APP for review.

## 2024-01-01 NOTE — Telephone Encounter (Signed)
 Left message per preop APP Palmer Bobo, NP he has been cleared for his surgery. However, if echo does show any abnormalities, the preop team will d/w surgeon at that time.

## 2024-01-04 ENCOUNTER — Telehealth: Payer: Self-pay | Admitting: Internal Medicine

## 2024-01-04 ENCOUNTER — Ambulatory Visit (HOSPITAL_COMMUNITY)
Admission: RE | Admit: 2024-01-04 | Discharge: 2024-01-04 | Disposition: A | Discharge status: Home / Self Care | Source: Ambulatory Visit | Attending: Cardiology | Admitting: Cardiology

## 2024-01-04 DIAGNOSIS — Q211 Atrial septal defect, unspecified: Secondary | ICD-10-CM

## 2024-01-04 LAB — ECHOCARDIOGRAM COMPLETE
Area-P 1/2: 3.39 cm2
S' Lateral: 3 cm

## 2024-01-04 NOTE — Telephone Encounter (Signed)
 Unable to reach Bradley Hull. Left a very detailed message about me having his form having Dr. Rochelle Chu Signature and will complete the form when I get a chance tomorrow the latest.

## 2024-01-04 NOTE — Telephone Encounter (Signed)
 Copied from CRM [id number]. Topic: General - Other >> Jan 04, 2024  9:46 AM Juleen Oakland F wrote: Reason for CRM: Melecio Sports from [facility] called to follow up on fax that was sent 12/25/23 regarding patient independent assessment for for [facility] services. Please call him with an update at [phone]

## 2024-01-04 NOTE — Telephone Encounter (Signed)
 Unable to reach the patient. LMTRC. Paperwork has been completed and faxed.

## 2024-01-04 NOTE — Telephone Encounter (Signed)
 Patient is calling back in regarding the home care form

## 2024-01-08 ENCOUNTER — Encounter: Payer: Self-pay | Admitting: Gastroenterology

## 2024-01-10 ENCOUNTER — Ambulatory Visit: Payer: Self-pay | Admitting: Emergency Medicine

## 2024-01-11 NOTE — Progress Notes (Addendum)
 COVID Vaccine Completed: yes  Date of COVID positive in last 90 days:  PCP - Sandra Crouch, MD Cardiologist - Fransico Ivy, MD  Cardiac clearance by Palmer Bobo, NP 01/10/24 in Epic  Coronary CT- 12/12/23 Epic Chest x-ray - chest CT- 02/21/23 Epic EKG - 11/21/23 Epic Stress Test - n/a ECHO - 01/04/24 Epic Cardiac Cath - n/a Pacemaker/ICD device last checked: n/a Spinal Cord Stimulator: n/a  Bowel Prep - no  Sleep Study - n/a CPAP -   Fasting Blood Sugar - 97 Checks Blood Sugar has CGM, libre 3  Last dose of GLP1 agonist-  N/A GLP1 instructions:  Do not take after     Last dose of SGLT-2 inhibitors-  N/A SGLT-2 instructions:  Do not take after     Blood Thinner Instructions:  Last dose:  N/a Time: Aspirin  Instructions: ASA 81, hold 5-7 days Last Dose:  Activity level: Can go up a flight of stairs and perform activities of daily living without stopping and without symptoms of chest pain or shortness of breath.   Anesthesia review: HTN, atrial septal defect, chronic hep c, anemia, DM2, a fib, stroke, opioid abuse, pleural effusion, blood sugar drops in middle of night patient states in 50s. Patient is NPO after midnight. Talked with Loetta Ringer, [place] about situation. Instructed patient to have a snack right before midnight to try and combat low blood sugar. If his blood sugar still drops  instructed to have clear juice to get it back up. Patient verbalized understanding and RN highlighted in written instructions. Left voicemail with Dr. Vida Gravely RN to inform of situation and asked to call back if something different needs to be done.  Patient denies shortness of breath, fever, cough and chest pain at PAT appointment  Patient verbalized understanding of instructions that were given to them at the PAT appointment. Patient was also instructed that they will need to review over the PAT instructions again at home before surgery.

## 2024-01-11 NOTE — Patient Instructions (Addendum)
 SURGICAL WAITING ROOM VISITATION  Patients having surgery or a procedure may have no more than 2 support people in the waiting area - these visitors may rotate.    Children under the age of 33 must have an adult with them who is not the patient.  Visitors with respiratory illnesses are discouraged from visiting and should remain at home.  If the patient needs to stay at the hospital during part of their recovery, the visitor guidelines for inpatient rooms apply. Pre-op nurse will coordinate an appropriate time for 1 support person to accompany patient in pre-op.  This support person may not rotate.    Please refer to the [facility] website for the visitor guidelines for Inpatients (after your surgery is over and you are in a regular room).    Your procedure is scheduled on: 01/19/24   Report to [facility] Main Entrance    Report to admitting at 6:45 AM   Call this number if you have problems the morning of surgery [phone]   Do not eat food or drink liquids :After Midnight.          If you have questions, please contact your surgeon's office.   FOLLOW BOWEL PREP AND ANY ADDITIONAL PRE OP INSTRUCTIONS YOU RECEIVED FROM YOUR SURGEON'S OFFICE!!!     Oral Hygiene is also important to reduce your risk of infection.                                    Remember - BRUSH YOUR TEETH THE MORNING OF SURGERY WITH YOUR REGULAR TOOTHPASTE  DENTURES WILL BE REMOVED PRIOR TO SURGERY PLEASE DO NOT APPLY Poly grip OR ADHESIVES!!!   Stop all vitamins and herbal supplements 7 days before surgery.   Take these medicines the morning of surgery with A SIP OF WATER: Amlodipine , Carvedilol   DO NOT TAKE ANY ORAL DIABETIC MEDICATIONS DAY OF YOUR SURGERY  How to Manage Your Diabetes Before and After Surgery  Why is it important to control my blood sugar before and after surgery? Improving blood sugar levels before and after surgery helps healing and can limit problems. A way of  improving blood sugar control is eating a healthy diet by:  Eating less sugar and carbohydrates  Increasing activity/exercise  Talking with your doctor about reaching your blood sugar goals High blood sugars (greater than 180 mg/dL) can raise your risk of infections and slow your recovery, so you will need to focus on controlling your diabetes during the weeks before surgery. Make sure that the doctor who takes care of your diabetes knows about your planned surgery including the date and location.  How do I manage my blood sugar before surgery? Check your blood sugar at least 4 times a day, starting 2 days before surgery, to make sure that the level is not too high or low. Check your blood sugar the morning of your surgery when you wake up and every 2 hours until you get to the Short Stay unit. If your blood sugar is less than 70 mg/dL, you will need to treat for low blood sugar: Do not take insulin . Treat a low blood sugar (less than 70 mg/dL) with  cup of clear juice (cranberry or apple), 4 glucose tablets, OR glucose gel. Recheck blood sugar in 15 minutes after treatment (to make sure it is greater than 70 mg/dL). If your blood sugar is not greater than 70 mg/dL  on recheck, call [phone] for further instructions. Report your blood sugar to the short stay nurse when you get to Short Stay.  If you are admitted to the hospital after surgery: Your blood sugar will be checked by the staff and you will probably be given insulin  after surgery (instead of oral diabetes medicines) to make sure you have good blood sugar levels. The goal for blood sugar control after surgery is 80-180 mg/dL.   WHAT DO I DO ABOUT MY DIABETES MEDICATION?  Do not take oral diabetes medicines (pills) the morning of surgery.  THE DAY BEFORE SURGERY, take  insulin  Ronnie Colander) as prescribed in the morning.      THE MORNING OF SURGERY, do not take insulin  (Soliqua ).  Reviewed and Endorsed by [facility] Patient  Education Committee, August 2015                              You may not have any metal on your body including jewelry, and body piercing             Do not wear lotions, powders, cologne, or deodorant              Men may shave face and neck.   Do not bring valuables to the hospital. [place] IS NOT             RESPONSIBLE   FOR VALUABLES.   Contacts, glasses, dentures or bridgework may not be worn into surgery.   Bring small overnight bag day of surgery.   DO NOT BRING YOUR HOME MEDICATIONS TO THE HOSPITAL. PHARMACY WILL DISPENSE MEDICATIONS LISTED ON YOUR MEDICATION LIST TO YOU DURING YOUR ADMISSION IN THE HOSPITAL!    Special Instructions: Bring a copy of your healthcare power of attorney and living will documents the day of surgery if you haven't scanned them before.              Please read over the following fact sheets you were given: IF YOU HAVE QUESTIONS ABOUT YOUR PRE-OP INSTRUCTIONS PLEASE CALL [phone] Party   If you received a COVID test during your pre-op visit  it is requested that you wear a mask when out in public, stay away from anyone that may not be feeling well and notify your surgeon if you develop symptoms. If you test positive for Covid or have been in contact with anyone that has tested positive in the last 10 days please notify you surgeon.    [facility] - Preparing for Surgery Before surgery, you can play an important role.  Because skin is not sterile, your skin needs to be as free of germs as possible.  You can reduce the number of germs on your skin by washing with CHG (chlorahexidine gluconate) soap before surgery.  CHG is an antiseptic cleaner which kills germs and bonds with the skin to continue killing germs even after washing. Please DO NOT use if you have an allergy to CHG or antibacterial soaps.  If your skin becomes reddened/irritated stop using the CHG and inform your nurse when you arrive at Short Stay. Do not shave (including legs and  underarms) for at least 48 hours prior to the first CHG shower.  You may shave your face/neck.  Please follow these instructions carefully:  1.  Shower with CHG Soap the night before surgery and the  morning of surgery.  2.  If you choose to wash your hair, wash  your hair first as usual with your normal  shampoo.  3.  After you shampoo, rinse your hair and body thoroughly to remove the shampoo.                             4.  Use CHG as you would any other liquid soap.  You can apply chg directly to the skin and wash.  Gently with a scrungie or clean washcloth.  5.  Apply the CHG Soap to your body ONLY FROM THE NECK DOWN.   Do   not use on face/ open                           Wound or open sores. Avoid contact with eyes, ears mouth and   genitals (private parts).                       Wash face,  Genitals (private parts) with your normal soap.             6.  Wash thoroughly, paying special attention to the area where your    surgery  will be performed.  7.  Thoroughly rinse your body with warm water from the neck down.  8.  DO NOT shower/wash with your normal soap after using and rinsing off the CHG Soap.                9.  Pat yourself dry with a clean towel.            10.  Wear clean pajamas.            11.  Place clean sheets on your bed the night of your first shower and do not  sleep with pets. Day of Surgery : Do not apply any lotions/deodorants the morning of surgery.  Please wear clean clothes to the hospital/surgery center.  FAILURE TO FOLLOW THESE INSTRUCTIONS MAY RESULT IN THE CANCELLATION OF YOUR SURGERY  PATIENT SIGNATURE_________________________________  NURSE SIGNATURE__________________________________  ________________________________________________________________________

## 2024-01-12 ENCOUNTER — Encounter (HOSPITAL_COMMUNITY): Payer: Self-pay

## 2024-01-12 ENCOUNTER — Encounter (HOSPITAL_COMMUNITY)
Admission: RE | Admit: 2024-01-12 | Discharge: 2024-01-12 | Disposition: A | Discharge status: Home / Self Care | Source: Ambulatory Visit | Attending: Urology | Admitting: Urology

## 2024-01-12 ENCOUNTER — Other Ambulatory Visit: Payer: Self-pay

## 2024-01-12 VITALS — BP 153/76 | HR 66 | Temp 98.0°F | Resp 14 | Ht 66.0 in | Wt 230.0 lb

## 2024-01-12 DIAGNOSIS — E119 Type 2 diabetes mellitus without complications: Secondary | ICD-10-CM | POA: Diagnosis not present

## 2024-01-12 DIAGNOSIS — I700 Atherosclerosis of aorta: Secondary | ICD-10-CM | POA: Diagnosis not present

## 2024-01-12 DIAGNOSIS — N401 Enlarged prostate with lower urinary tract symptoms: Secondary | ICD-10-CM | POA: Insufficient documentation

## 2024-01-12 DIAGNOSIS — F17290 Nicotine dependence, other tobacco product, uncomplicated: Secondary | ICD-10-CM | POA: Insufficient documentation

## 2024-01-12 DIAGNOSIS — Z01812 Encounter for preprocedural laboratory examination: Secondary | ICD-10-CM | POA: Diagnosis present

## 2024-01-12 DIAGNOSIS — B182 Chronic viral hepatitis C: Secondary | ICD-10-CM | POA: Insufficient documentation

## 2024-01-12 DIAGNOSIS — Z7982 Long term (current) use of aspirin: Secondary | ICD-10-CM | POA: Insufficient documentation

## 2024-01-12 DIAGNOSIS — I2510 Atherosclerotic heart disease of native coronary artery without angina pectoris: Secondary | ICD-10-CM | POA: Insufficient documentation

## 2024-01-12 DIAGNOSIS — R338 Other retention of urine: Secondary | ICD-10-CM | POA: Insufficient documentation

## 2024-01-12 DIAGNOSIS — Z794 Long term (current) use of insulin: Secondary | ICD-10-CM | POA: Diagnosis not present

## 2024-01-12 DIAGNOSIS — I4891 Unspecified atrial fibrillation: Secondary | ICD-10-CM | POA: Diagnosis not present

## 2024-01-12 DIAGNOSIS — I10 Essential (primary) hypertension: Secondary | ICD-10-CM | POA: Diagnosis not present

## 2024-01-12 LAB — COMPREHENSIVE METABOLIC PANEL WITH GFR
ALT: 17 U/L (ref 0–44)
AST: 16 U/L (ref 15–41)
Albumin: 3.8 g/dL (ref 3.5–5.0)
Alkaline Phosphatase: 87 U/L (ref 38–126)
Anion gap: 7 (ref 5–15)
BUN: 15 mg/dL (ref 8–23)
CO2: 25 mmol/L (ref 22–32)
Calcium: 9.3 mg/dL (ref 8.9–10.3)
Chloride: 109 mmol/L (ref 98–111)
Creatinine, Ser: 0.88 mg/dL (ref 0.61–1.24)
GFR, Estimated: >60 mL/min (ref >60)
Glucose, Bld: 120 mg/dL — ABNORMAL HIGH (ref 70–99)
Potassium: 4.7 mmol/L (ref 3.5–5.1)
Sodium: 141 mmol/L (ref 135–145)
Total Bilirubin: 1 mg/dL (ref 0.0–1.2)
Total Protein: 7.2 g/dL (ref 6.5–8.1)

## 2024-01-12 LAB — GLUCOSE, CAPILLARY: Glucose-Capillary: 118 mg/dL — ABNORMAL HIGH (ref 70–99)

## 2024-01-12 LAB — CBC
HCT: 37.7 % — ABNORMAL LOW (ref 39.0–52.0)
Hemoglobin: 11.8 g/dL — ABNORMAL LOW (ref 13.0–17.0)
MCH: 29.1 pg (ref 26.0–34.0)
MCHC: 31.3 g/dL (ref 30.0–36.0)
MCV: 93.1 fL (ref 80.0–100.0)
Platelets: 246 10*3/uL (ref 150–400)
RBC: 4.05 MIL/uL — ABNORMAL LOW (ref 4.22–5.81)
RDW: 15 % (ref 11.5–15.5)
WBC: 7.3 10*3/uL (ref 4.0–10.5)
nRBC: 0 % (ref 0.0–0.2)

## 2024-01-12 LAB — HEMOGLOBIN A1C
Hgb A1c MFr Bld: 6.6 % — ABNORMAL HIGH (ref 4.8–5.6)
Mean Plasma Glucose: 142.72 mg/dL

## 2024-01-15 ENCOUNTER — Other Ambulatory Visit: Payer: Self-pay | Admitting: Nurse Practitioner

## 2024-01-15 DIAGNOSIS — I8510 Secondary esophageal varices without bleeding: Secondary | ICD-10-CM

## 2024-01-15 DIAGNOSIS — K7469 Other cirrhosis of liver: Secondary | ICD-10-CM

## 2024-01-15 NOTE — Progress Notes (Signed)
 Anesthesia Chart Review   Case: [id number] Date/Time: 01/19/24 0845   Procedure: TURP (TRANSURETHRAL RESECTION OF PROSTATE)   Anesthesia type: General   Diagnosis: Hypertrophy of prostate with urinary retention [N40.1, R33.8]   Pre-op diagnosis: BENIGN PROSTATIC HYPERPLASIA   Location: [facility] PROC[facility]   Surgeons: Watt Rush, MD       DISCUSSION:72 y.o. smoker with h/o HTN, DM II (A1C 6.6), treated Hep C, atrial fibrillation, BPH scheduled for above procedure 01/19/2024 with Dr. Rush Watt.   Per cardiology preoperative evaluation 12/29/2023, Chart reviewed as part of pre-operative protocol coverage. Given past medical history and time since last visit, based on ACC/AHA guidelines, Bradley Hull is at acceptable risk for the planned procedure without further cardiovascular testing.    Pt was seen in clinic on 12/21/2023 by Lum Louis, NP. Per Madision Louis, NP, He was able to complete greater than 4 METS without symptoms.  Okay to proceed with surgery.   Regarding ASA therapy, we recommend continuation of ASA throughout the perioperative period.  However, if the surgeon feels that cessation of ASA is required in the perioperative period, it may be stopped 5-7 days prior to surgery with a plan to resume it as soon as felt to be feasible from a surgical standpoint in the post-operative period.  VS: BP (!) 153/76   Pulse 66   Temp 36.7 C (Oral)   Resp 14   Ht 5' 6 (1.676 m)   Wt 104.3 kg   SpO2 98%   BMI 37.12 kg/m   PROVIDERS: Joshua Debby CROME, MD is PCP   Cardiologist - Newman Lawrence, MD  LABS: Labs reviewed: Acceptable for surgery. (all labs ordered are listed, but only abnormal results are displayed)  Labs Reviewed  HEMOGLOBIN A1C - Abnormal; Notable for the following components:      Result Value   Hgb A1c MFr Bld 6.6 (*)    All other components within normal limits  COMPREHENSIVE METABOLIC PANEL WITH GFR - Abnormal; Notable for the following  components:   Glucose, Bld 120 (*)    All other components within normal limits  CBC - Abnormal; Notable for the following components:   RBC 4.05 (*)    Hemoglobin 11.8 (*)    HCT 37.7 (*)    All other components within normal limits  GLUCOSE, CAPILLARY - Abnormal; Notable for the following components:   Glucose-Capillary 118 (*)    All other components within normal limits     IMAGES:   EKG:   CV: CT Coronary 12/08/2023 IMPRESSION: 1. Minimal mixed non-obstructive CAD, CADRADS = 1.   2. Normal coronary origin with right dominance.   3. Coronary artery calcium  score is 281, which places the patient in the 80th percentile for age/race and sex-matched controls (MESA).   4. Total plaque volume 470 mm3 which is 13th percentile for age- and sex-matched controls (calcified plaque 59 mm3; non-calcified plaque 411 mm3, of which 4 mm3 is low-attenuation). TPV is (severe).   5. Small ASD with left to right flow suspected. Correlate with echo bubble study if clinically relevant.   6. Aortic atherosclerosis.  Echo 01/04/2024 1. Left ventricular ejection fraction, by estimation, is 60 to 65%. Left  ventricular ejection fraction by 3D volume is 60 %. The left ventricle has  normal function. The left ventricle has no regional wall motion  abnormalities. There is mild concentric  left ventricular hypertrophy. Left ventricular diastolic parameters are  consistent with Grade I diastolic dysfunction (impaired  relaxation).   2. Right ventricular systolic function is normal. The right ventricular  size is normal.   3. The mitral valve is normal in structure. Trivial mitral valve  regurgitation. No evidence of mitral stenosis.   4. The aortic valve is tricuspid. There is mild calcification of the  aortic valve. Aortic valve regurgitation is trivial. Aortic valve  sclerosis/calcification is present, without any evidence of aortic  stenosis.   5. The inferior vena cava is normal in size  with greater than 50%  respiratory variability, suggesting right atrial pressure of 3 mmHg.  Past Medical History:  Diagnosis Date   A-fib [facility])    Arthritis    Benign prostatic hyperplasia    Cirrhosis (HCC)    Colon polyps    Complication of anesthesia    bp droppped and heart rate sped up after 09-09-2020 endoscopy /colonscopy   DM type 2 (diabetes mellitus, type 2) (HCC)    Hepatitis C    took treatment for 2018 issue resolved per pt   History of kidney stones    Hypertension    Nicotine  addiction    Obesity    Osteoarthritis of both knees    Prostate cancer (HCC)    Renal lithiasis    Stroke [facility])     Past Surgical History:  Procedure Laterality Date   COLONOSCOPY  09/09/2020   [facility]   CYSTOSCOPY  11/17/2020   Procedure: CYSTOSCOPY FLEXIBLE;  Surgeon: Watt Rush, MD;  Location: [facility];  Service: Urology;;   CYSTOSCOPY/URETEROSCOPY/HOLMIUM LASER/STENT PLACEMENT Right 07/08/2022   Procedure: CYSTOSCOPY RIGHT RETROGRADE PYELOGRAM /URETEROSCOPY/HOLMIUM LASER/STENT PLACEMENT;  Surgeon: Watt Rush, MD;  Location: [facility];  Service: Urology;  Laterality: Right;  1 HR   ESOPHAGOGASTRODUODENOSCOPY  2016   KIDNEY STONE SURGERY Right yrs ago   LITHOTRIPSY Left 1986   MULTIPLE EXTRACTIONS WITH ALVEOLOPLASTY N/A 09/09/2016   Procedure: MULTIPLE EXTRACTION WITH ALVEOLOPLASTY - one, two, six, seven, nine, ten, eleven, sixteen, seventeen, twenty, twenty-one, twenty-two, twenty-four, twenty-five, twenty-six, twenty-seven, twenty-eight, twenty-nine, thirty-one, thirty-two; alveolplasty; removal bilateral mandible turi; removal cyst right mandible;  bone graft;  Surgeon: Glendia Primrose, DDS;  Location: [facility];  Service: Oral S   PILONIDAL CYST EXCISION  yrs ago   from spine   SPACE OAR INSTILLATION N/A 11/17/2020   Procedure: SPACE OAR INSTILLATION FIDUCIAL MARKER;  Surgeon: Watt Rush, MD;  Location: [facility];  Service: Urology;  Laterality: N/A;   prostate   TOTAL KNEE ARTHROPLASTY Bilateral 03/2020   UPPER GASTROINTESTINAL ENDOSCOPY  08/30/2020   [facility]    WISDOM TOOTH EXTRACTION     x4 removed    MEDICATIONS:  HYDROcodone -acetaminophen  (NORCO) 7.5-325 MG tablet   Vibegron 75 MG TABS   ACCU-CHEK GUIDE TEST test strip   amLODipine  (NORVASC ) 10 MG tablet   aspirin  EC 81 MG tablet   atorvastatin  (LIPITOR) 80 MG tablet   Blood Glucose Monitoring Suppl (ACCU-CHEK GUIDE ME) w/Device KIT   carvedilol  (COREG ) 6.25 MG tablet   Continuous Glucose Receiver (FREESTYLE LIBRE 2 READER) DEVI   Continuous Glucose Sensor (FREESTYLE LIBRE 2 SENSOR) MISC   furosemide  (LASIX ) 20 MG tablet   Insulin  Glargine-Lixisenatide  (SOLIQUA ) 100-33 UNT-MCG/ML SOPN   omeprazole  (PRILOSEC) 40 MG capsule   No current facility-administered medications for this encounter.     Harlene Hoots Ward, PA-C [facility] Pre-Surgical Testing [phone]

## 2024-01-17 ENCOUNTER — Ambulatory Visit (INDEPENDENT_AMBULATORY_CARE_PROVIDER_SITE_OTHER): Admitting: Internal Medicine

## 2024-01-17 ENCOUNTER — Encounter: Payer: Self-pay | Admitting: Internal Medicine

## 2024-01-17 VITALS — BP 142/86 | HR 61 | Temp 98.3°F | Resp 16 | Ht 66.0 in | Wt 234.4 lb

## 2024-01-17 DIAGNOSIS — I10 Essential (primary) hypertension: Secondary | ICD-10-CM

## 2024-01-17 DIAGNOSIS — G8929 Other chronic pain: Secondary | ICD-10-CM

## 2024-01-17 DIAGNOSIS — G629 Polyneuropathy, unspecified: Secondary | ICD-10-CM | POA: Diagnosis not present

## 2024-01-17 DIAGNOSIS — Z0001 Encounter for general adult medical examination with abnormal findings: Secondary | ICD-10-CM

## 2024-01-17 DIAGNOSIS — Z0000 Encounter for general adult medical examination without abnormal findings: Secondary | ICD-10-CM | POA: Diagnosis not present

## 2024-01-17 DIAGNOSIS — M545 Low back pain, unspecified: Secondary | ICD-10-CM

## 2024-01-17 DIAGNOSIS — E119 Type 2 diabetes mellitus without complications: Secondary | ICD-10-CM

## 2024-01-17 DIAGNOSIS — K635 Polyp of colon: Secondary | ICD-10-CM

## 2024-01-17 DIAGNOSIS — Z794 Long term (current) use of insulin: Secondary | ICD-10-CM

## 2024-01-17 MED ORDER — GVOKE HYPOPEN 2-PACK 1 MG/0.2ML ~~LOC~~ SOAJ: 1.0000 | Freq: Every day | SUBCUTANEOUS | 5 refills | Status: AC | PRN

## 2024-01-17 NOTE — Patient Instructions (Signed)
 Health Maintenance, Male  Adopting a healthy lifestyle and getting preventive care are important in promoting health and wellness. Ask your health care provider about:  The right schedule for you to have regular tests and exams.  Things you can do on your own to prevent diseases and keep yourself healthy.  What should I know about diet, weight, and exercise?  Eat a healthy diet    Eat a diet that includes plenty of vegetables, fruits, low-fat dairy products, and lean protein.  Do not eat a lot of foods that are high in solid fats, added sugars, or sodium.  Maintain a healthy weight  Body mass index (BMI) is a measurement that can be used to identify possible weight problems. It estimates body fat based on height and weight. Your health care provider can help determine your BMI and help you achieve or maintain a healthy weight.  Get regular exercise  Get regular exercise. This is one of the most important things you can do for your health. Most adults should:  Exercise for at least 150 minutes each week. The exercise should increase your heart rate and make you sweat (moderate-intensity exercise).  Do strengthening exercises at least twice a week. This is in addition to the moderate-intensity exercise.  Spend less time sitting. Even light physical activity can be beneficial.  Watch cholesterol and blood lipids  Have your blood tested for lipids and cholesterol at 71 years of age, then have this test every 5 years.  You may need to have your cholesterol levels checked more often if:  Your lipid or cholesterol levels are high.  You are older than 71 years of age.  You are at high risk for heart disease.  What should I know about cancer screening?  Many types of cancers can be detected early and may often be prevented. Depending on your health history and family history, you may need to have cancer screening at various ages. This may include screening for:  Colorectal cancer.  Prostate cancer.  Skin cancer.  Lung  cancer.  What should I know about heart disease, diabetes, and high blood pressure?  Blood pressure and heart disease  High blood pressure causes heart disease and increases the risk of stroke. This is more likely to develop in people who have high blood pressure readings or are overweight.  Talk with your health care provider about your target blood pressure readings.  Have your blood pressure checked:  Every 3-5 years if you are 9-95 years of age.  Every year if you are 85 years old or older.  If you are between the ages of 29 and 29 and are a current or former smoker, ask your health care provider if you should have a one-time screening for abdominal aortic aneurysm (AAA).  Diabetes  Have regular diabetes screenings. This checks your fasting blood sugar level. Have the screening done:  Once every three years after age 23 if you are at a normal weight and have a low risk for diabetes.  More often and at a younger age if you are overweight or have a high risk for diabetes.  What should I know about preventing infection?  Hepatitis B  If you have a higher risk for hepatitis B, you should be screened for this virus. Talk with your health care provider to find out if you are at risk for hepatitis B infection.  Hepatitis C  Blood testing is recommended for:  Everyone born from [birth year] through 1963.  Anyone  with known risk factors for hepatitis C.  Sexually transmitted infections (STIs)  You should be screened each year for STIs, including gonorrhea and chlamydia, if:  You are sexually active and are younger than 71 years of age.  You are older than 71 years of age and your health care provider tells you that you are at risk for this type of infection.  Your sexual activity has changed since you were last screened, and you are at increased risk for chlamydia or gonorrhea. Ask your health care provider if you are at risk.  Ask your health care provider about whether you are at high risk for HIV. Your health care provider  may recommend a prescription medicine to help prevent HIV infection. If you choose to take medicine to prevent HIV, you should first get tested for HIV. You should then be tested every 3 months for as long as you are taking the medicine.  Follow these instructions at home:  Alcohol use  Do not drink alcohol if your health care provider tells you not to drink.  If you drink alcohol:  Limit how much you have to 0-2 drinks a day.  Know how much alcohol is in your drink. In the [place] one drink equals one 12 oz bottle of beer (355 mL), one 5 oz glass of wine (148 mL), or one 1 oz glass of hard liquor (44 mL).  Lifestyle  Do not use any products that contain nicotine or tobacco. These products include cigarettes, chewing tobacco, and vaping devices, such as e-cigarettes. If you need help quitting, ask your health care provider.  Do not use street drugs.  Do not share needles.  Ask your health care provider for help if you need support or information about quitting drugs.  General instructions  Schedule regular health, dental, and eye exams.  Stay current with your vaccines.  Tell your health care provider if:  You often feel depressed.  You have ever been abused or do not feel safe at home.  Summary  Adopting a healthy lifestyle and getting preventive care are important in promoting health and wellness.  Follow your health care provider's instructions about healthy diet, exercising, and getting tested or screened for diseases.  Follow your health care provider's instructions on monitoring your cholesterol and blood pressure.  This information is not intended to replace advice given to you by your health care provider. Make sure you discuss any questions you have with your health care provider.  Document Revised: 11/30/2020 Document Reviewed: 11/30/2020  Elsevier Patient Education  2024 [employer].

## 2024-01-17 NOTE — Progress Notes (Unsigned)
 Subjective:  Patient ID: Bradley Hull, male    DOB: Dec 19, 1950  Age: 73 y.o. MRN: [id number]  CC: Annual Exam, Osteoarthritis, Anemia, Hypertension, Back Pain, Diabetes, and Hyperlipidemia   HPI Bradley Hull presents for a CPX and f/up ----  Discussed the use of AI scribe software for clinical note transcription with the patient, who gave verbal consent to proceed.  History of Present Illness   Bradley Hull is a 73 year old male with diabetes who presents with concerns about low blood sugar and back pain.  He experiences recurrent episodes of nocturnal hypoglycemia, with blood sugar levels dropping to 58-59 mg/dL. These episodes are particularly concerning due to his upcoming prostate surgery. He manages these episodes by consuming peanut butter at night. He takes 20 units of insulin  in the morning but sometimes adjusts the dose to 15 units based on his blood sugar readings. He uses a device on his arm to help manage his insulin  levels during the day.  He has a history of spinal deterioration with two spots identified over three years ago, and he recalls being told that surgery might be needed. He experiences back pain, which does not radiate to his legs or feet. He takes hydrocodone  for the pain, but it provides minimal relief. He also reports progressive numbness and tingling in his hands and occasionally in his feet.  He recently underwent lab work and imaging studies in preparation for surgery. He mentions being told about a possible small hole in his heart. He experiences shortness of breath but no chest pain.       Outpatient Medications Prior to Visit  Medication Sig Dispense Refill   amLODipine  (NORVASC ) 10 MG tablet Take 1 tablet by mouth once daily 90 tablet 0   aspirin  EC 81 MG tablet Take 81 mg by mouth daily. Swallow whole.     atorvastatin  (LIPITOR) 80 MG tablet Take 1 tablet (80 mg total) by mouth at bedtime. 90 tablet 0   carvedilol  (COREG ) 6.25 MG tablet TAKE 1  TABLET BY MOUTH TWICE DAILY WITH A MEAL 180 tablet 0   Continuous Glucose Receiver (FREESTYLE LIBRE 2 READER) DEVI Use as directed to check blood sugars 2 each 5   Continuous Glucose Sensor (FREESTYLE LIBRE 2 SENSOR) MISC USE AS DIRECTED 2 each 5   furosemide  (LASIX ) 20 MG tablet Take 1 tablet (20 mg total) by mouth daily. 90 tablet 1   HYDROcodone -acetaminophen  (NORCO) 7.5-325 MG tablet Take 1 tablet by mouth every 6 (six) hours as needed for moderate pain (pain score 4-6).     Insulin  Glargine-Lixisenatide  (SOLIQUA ) 100-33 UNT-MCG/ML SOPN Inject 20 Units into the skin daily. 18 mL 0   Vibegron 75 MG TABS Take 75 mg by mouth daily.     ACCU-CHEK GUIDE TEST test strip USE 1 STRIP TO CHECK GLUCOSE IN THE MORNING AND AT NOON AND AT BEDTIME. USE AS INSTRUCTED. 300 each 0   Blood Glucose Monitoring Suppl (ACCU-CHEK GUIDE ME) w/Device KIT Inject 1 Act into the skin 3 (three) times daily. USE AS DIRECTED 1 kit 1   omeprazole  (PRILOSEC) 40 MG capsule Use Prilosec (omeprazole ) 40 mg PO BID for 6 weeks to promote mucosal healing, then reduce to 40 mg daily and continue to titrate to lowest effective dose if abdominal symptoms resolved. (Patient not taking: Reported on 01/08/2024) 180 capsule 1   No facility-administered medications prior to visit.    ROS Review of Systems  Objective:  BP (!) 140/86 (BP Location:  Right Arm, Patient Position: Sitting, Cuff Size: Normal)   Pulse 61   Temp 98.3 F (36.8 C) (Oral)   Ht 5' 6 (1.676 m)   Wt 234 lb 6.4 oz (106.3 kg)   SpO2 99%   BMI 37.83 kg/m   BP Readings from Last 3 Encounters:  01/17/24 (!) 140/86  01/12/24 (!) 153/76  12/21/23 130/78    Wt Readings from Last 3 Encounters:  01/17/24 234 lb 6.4 oz (106.3 kg)  01/12/24 230 lb (104.3 kg)  12/21/23 235 lb (106.6 kg)    Physical Exam  Lab Results  Component Value Date   WBC 7.3 01/12/2024   HGB 11.8 (L) 01/12/2024   HCT 37.7 (L) 01/12/2024   PLT 246 01/12/2024   GLUCOSE 120 (H)  01/12/2024   CHOL 70 11/16/2023   TRIG 85.0 11/16/2023   HDL 36.70 (L) 11/16/2023   LDLCALC 16 11/16/2023   ALT 17 01/12/2024   AST 16 01/12/2024   NA 141 01/12/2024   K 4.7 01/12/2024   CL 109 01/12/2024   CREATININE 0.88 01/12/2024   BUN 15 01/12/2024   CO2 25 01/12/2024   TSH 0.67 11/16/2023   PSA 0.1 06/26/2023   INR 1.1 (H) 11/16/2023   HGBA1C 6.6 (H) 01/12/2024   MICROALBUR 2.2 (H) 04/10/2023    No results found.  Assessment & Plan:  Encounter for general adult medical examination with abnormal findings  Chronic bilateral low back pain without sciatica -     Ambulatory referral to Physical Medicine Rehab  Peripheral polyneuropathy -     Ambulatory referral to Neurology  Insulin -requiring or dependent type II diabetes mellitus (HCC) -     Gvoke HypoPen  2-Pack; Inject 1 Act into the skin daily as needed.  Dispense: 2 mL; Refill: 5     Follow-up: No follow-ups on file.  Debby Molt, MD

## 2024-01-18 NOTE — H&P (Signed)
 1 - Prostate Cancer - s/p external beam radiation 2022 / SPACE OAR   2 - Recurrent Urinary Retention - on /off foley since mid 2022 around time of prostate radiation. Now on tamsulosin  BID. TRUS 75gm, no median. Adding finasteride  02/2021.   PMH sig for obesity.   03/02/21: Bradley Hull is seen as work in to rule out recurrent retention. PVR today 49mL (excellent). Mostly c/o irritative symptoms. He has just finished prostate radiation. UA today normal. Did not get much help from prior anticholinertic.   04/05/21: [name] returns today in f/u. He has variable LUTS with an IPSS of 25. He has a reduced stream and nocturia x 5 with frequency, urgency and intermittency. He remains on tamsulosin  bid and finasteride . His PSA is down to 0.29. He has no hematuria but has some dysuria. His UA looks infected today.   05/03/2021: Continued on tamsulosin  and finasteride  at last visit. He was empirically treated with Bactrim for suspected UTI. C/S resulted with mix growth. Now back today for f/u exam.   Patient continues to have intermittent dysuria. Not associated with gross hematuria. His voiding symptoms have not changed even with recent antimicrobial treatment. He continues to void fairly frequently with associated urgency during the daytime as well as nocturia every hour. His stream remains weak with some mild hesitancy and straining to start, also with some intermittency. UA continues to show signs of an underlying infectious process.   10/08/21: Bradley Hull returns today in f/u. His PSA was down to 0.29 in 9/22 with radiation for prostate cancer earlier in the year. He remains on tamsulosin  and finasteride  for BPH with BOO. He has had a couple of TIA's since his last visit and has a 100% right carotid occlusion. His IPSS is 20 with nocturia x 4, urgency and frequency. He has had no hematuria. He has some constipation but no bloody stools. He has lost about 5 lbs. he has some right shoulder pain. He has persistent  microhematuria and was evaluated in early 2022 with CT and cystoscopy. He has non-obstructing renal stones. He has some mild recovery of erectile functions. He needs a refill on tadalafil 5mg  today.   03/03/2022: Here today for evaluation of continued urinary frequency/urgency as well as nocturia. He is getting up 4-5 times per night and can sometimes void 1-2 times per hour during the day. He does not stand up to void and typically will have to milk the penis to get completely empty. He does have some hesitancy and straining. Review of the symptoms with the patient indicates that they are similar and mostly unchanged compared to his last assessment earlier this year. He is not having dysuria or gross hematuria. He does remain on tamsulosin  and finasteride .   04/20/22: Bradley Hull returns today in f/u. He has a history of GG2 prostate cancer treated with radiation last year. His PSA has continued to fall and is down to 0.094. He remains on tamsulosin . He is having increased voiding symptoms with an IPSS of 26. He has a reduced stream with intermittency. He has urgency and frequency. He has nocturia x 4. He has to sit to void because of a spraying stream and he has PVD. He is wearing pads for the dribbling and UUI. He has no dysuria or hematuria. He has no GI complaints. His UA is ok today. He had cystoscopy at the time of the SpaceOAR placement in 4/22 and had some lateral lobe hyperplasia with a 2-3cm prostate but not much obstruction. He had a  CVA in 11/22.   06/26/23: Bradley Hull returns today in f/u for his history of prostate cancer treated with EXRT in 2022, stones and BPH with BOO. He remains on tamsulosin . His IPSS is 25 with nocturia x 3 and a weak stream with a sensation of incomplete emptying. His UA today has 3-10 RBC's. HE has had some right flank pain and has some dysuria. His PSA was up to 0.19 from 0.094. He had been on finasteride  with the tamsulosin  but that has been stopped.   08/21/23: Bradley Hull returns  today in fu for cystoscopy. THe CT showed small non-obstructing bilateral renal stones. He did a flowrate with a volume of with a PF of 48ml/sec and a MF of 59ml/sec. He remains on tamsulosin  with nocturia q1hr and he wears a pad for incontinence. He can have some UUI. He doesn't have SUI.   11/03/2023: Bradley Hull is a 73 year old Hull who presents today with concerns of frequency of every 15 minutes, feeling like he is not emptying his bladder completely. Nocturia of every 15 to 20 minutes. He has a history of kidney stones but states he has no flank pain. He is requesting a Foley catheter.   11/14/23: Bradley Hull returns today in f/u. He had a foley placed on 11/03/23 because of severe frequency and nocturia. He has urodynamic scheduled on 11/24/23 and we were going to consider a TURP but we will need to see the UDS results first. He reports bronze urine today.   [id number]: Bradley Hull who presents for Foley catheter exchange, urine culture, and discussion of next steps. He recently saw cardiology to get clearance for bladder outlet obstruction procedure. He continues to have bothersome leakage around his Foley catheter. He denies fevers and chills. Denies flank pain. Catheter has been draining well.     ALLERGIES: Hydromorphone  - Itching (Mild)    MEDICATIONS: oxyBUTYnin  Chloride 5 MG Tablet 1 tablet PO TID PRN Take as needed for bladder spasms  Tamsulosin  HCl 0.4 MG Capsule 1 capsule PO Daily As Directed  Amlodipine  Besilate  Atorvastatin  Calcium  80 MG Tablet 1 tablet PO Daily  Carvedilol  6.25 MG Tablet  Furosemide   HYDROcodone -Acetaminophen   levETIRAcetam  500 MG Tablet 1 tablet PO Daily     GU PSH: Complex cystometrogram, w/ void pressure and urethral pressure profile studies, any technique - 11/24/2023 Complex Uroflow - 11/24/2023, 08/21/2023 Cystoscopy - 08/21/2023 Cystoscopy Ureteroscopy - 2017 Emg surf Electrd - 11/24/2023 ESWL - 2017 Inject For cystogram - 11/24/2023 Intrabd voidng Press  - 11/24/2023 Locm 300-399Mg /Ml Iodine,1Ml - 2022 Prostate Needle Biopsy - 2022 Transperineal Plmt Biodegradable Matrl 1/Mlt Njx - 2022 Ureteroscopic laser litho - 07/08/2022       PSH Notes: Cystoscopy With Ureteroscopy Left, Renal Lithotripsy, Knee Surgery, Pilonidal Cyst Resection   NON-GU PSH: Remove Pilonidal Lesion - 2017 Surgical Pathology, Gross And Microscopic Examination For Prostate Needle - 2022 Visit Complexity (formerly GPC1X) - 11/14/2023, 06/26/2023     GU PMH: Incomplete bladder emptying - 11/24/2023, Has foley. , - 11/14/2023 BPH w/LUTS, He is tolerating the foley. he will return for UDS next week and if there is confirmed obstruction, I will proceed with a TURP. I reviewd the risks of a TURP including bleeding, infection, incontinence, stricture, need for secondary procedures, ejaculatory and erectile dysfunction, thrombotic events, fluid overload and anesthetic complications. I explained that 95% of men will have relief of the obstructive symptoms and about 70% will have relief of the irritative symptoms. - 11/14/2023 Nocturia - 11/14/2023, - 11/03/2023, -  08/21/2023 (Stable), - 06/26/2023, - 03/03/2022, - 2023, - 2022, - 2022 (Stable), - 2022, - 2021 Urge incontinence - 11/14/2023, (Stable), - 08/21/2023, - 04/20/2022 Urinary Frequency - 11/14/2023, - 04/20/2022, - 03/03/2022, - 2023, - 2022, - 2022, - 2022, - 2022, - 2022 Weak Urinary Stream - 11/14/2023, (Stable), - 08/21/2023, Return for Urocuff prior to tcystoscopy. , - 06/26/2023, - 03/03/2022, - 2022, - 2022, - 2022 (Stable), - 2022, - 2021 Renal calculus - 11/03/2023, Small stones on CT. , - 08/21/2023, - 2023, He has bilateral lower pole stones, left > right, and will need that monitored over time. , - 2022 Microscopic hematuria, No clear cause noted. - 08/21/2023, return for cystoscopy. , - 06/26/2023, He has persistent microhematuria with a history of renal stones on CT. , - 2023, He has some renal stones but no other concerning cause was  found. , - 2022, The CT showed small lower pole renal stones and cyst. The bladder was unremarkable. Since he is interested in a seed implant and that has an associated cystoscopy, I will just defer cystoscopy until then. , - 2022, - 2022, - 2022 Prostate nodule w/ LUTS, He has BOO with OAB wet and moderate to severe LUTS. He has some degree of obstruction and a slow stream. I discussed options and will get him set up for a TURP. I reviewd the risks of a TURP including bleeding, infection, incontinence, stricture, need for secondary procedures, ejaculatory and erectile dysfunction, thrombotic events, fluid overload and anesthetic complications. I explained that 95% of men will have relief of the obstructive symptoms and about 70% will have relief of the irritative symptoms. - 08/21/2023, He has persistent LUTS with some increased incontinence but he is emptying well. He will continue the tamsulosin  but I will add Gemtesa . Side effects reviewed. He will return in 17month with a PVR. , - 04/20/2022, He has been able to cut the tamsulosin  down to 1 a day. I have refilled the tamsulosin  and finasteride . I will consider stopping the finasteride  at his next visit if the PSA continues to fall. , - 2023, His LUTS have worsened but it looks like he has a recurrent UTI. I will get a culture and start bactrim pending the culture and will consider suppressive therapy. , - 2022, His LUTS remain Moderate but his IPSS is down some. I am going to increase the tamsulosin  to 0.8mg  daily since he is interested in seeds and will be at higher risk for post op LUTS. , - 2022, - 2022, - 2022, He has moderate to severe LUTS and has a known large prostate with prior retention. He will stay on tamsulosin . , - 2021 History of prostate cancer, PSA was up at last check which may be related to stopping finasteride . I wll recheck today and in 6 months. - 06/26/2023, - 07/20/2022, His PSA continues to fall. I will have him stop dutasteride. PSA in  6 months. , - 04/20/2022, - 03/03/2022, His PSA was falling nicely in September and will be repeated today and at f/u in 6 months. , - 2023 History of urolithiasis, HE has bilateral lower pole renal stones. - 06/26/2023, History of kidney stones, - 2017 Rising PSA after prostate cancer treatment - 06/26/2023 Ureteral calculus - 07/20/2022, (Stable), - 06/20/2022, Calculus of right ureter, - 2017 Ureteral obstruction secondary to calculous - 06/20/2022 ED due to arterial insufficiency - 2023, We discussed oral meds and will give him a trial of tadalafil. He will try 2-4 tabs  po prn. I reviewed the side effects in detail and also the negative impact that radiation might have on his function. , - 2022 Acute Cystitis/UTI - 2022, - 2022 Prostate Cancer, PSA is falling nicely. Repeat in 3 months. - 2022, - 2022, - 2022, He just started radiation therapy. , - 2022, He has T2a N0 Mx Gleason 7(3+4) favorable intermediate risk prostate cancer with moderate LUTS on tamsulosin  and mild ED. I discussed options including AS, RP, EXRT, Seeds, Cryo and HIFU and he is interested in a Seed implant so I will set him up with Dr. Patrcia. , - 2022 Urinary Retention - 2022, - 2022, - 2022, - 2022, - 2022, - 2022, - 2019 Straining on Urination - 2022, - 2022, - 2022 Elevated PSA - 2022, His PSA is up to 3.71 but it was 3.6 in 2019. He has a subtle soft nodule at the left apex. Since he is having knee replacements tomorrow, I will just have him return in 4 months with a repeat PSA, but with the nodule he will probably need a biopsy. , - 2021    NON-GU PMH: Cutaneous abscess of other sites - 2022 Encounter for general adult medical examination without abnormal findings, Encounter for preventive health examination - 2017 Personal history of other diseases of the circulatory system, History of hypertension - 2017 Personal history of other diseases of the digestive system, History of cirrhosis - 2017 Personal history of other  diseases of the musculoskeletal system and connective tissue, History of arthritis - 2017 Personal history of other endocrine, nutritional and metabolic disease, History of diabetes mellitus - 2017 Personal history of other infectious and parasitic diseases, History of hepatitis C virus infection - 2017    FAMILY HISTORY: malignant neoplasm of kidney - Runs In Family malignant neoplasm of prostate - Runs In Family renal failure - Runs In Family   SOCIAL HISTORY: Marital Status: Single Preferred Language: English; Ethnicity: Not Hispanic Or Latino; Race: Black or African American Current Smoking Status: Patient smokes.   Tobacco Use Assessment Completed: Used Tobacco in last 30 days? Does drink.  Does not drink caffeine.     Notes: Alcohol use, Single, Number of children, Current smoker, Caffeine use   REVIEW OF SYSTEMS:    GU Review Male:   Patient reports burning/ pain with urination. Patient denies frequent urination, hard to postpone urination, get up at night to urinate, leakage of urine, stream starts and stops, trouble starting your stream, have to strain to urinate , erection problems, and penile pain.  Gastrointestinal (Upper):   Patient denies nausea, vomiting, and indigestion/ heartburn.  Gastrointestinal (Lower):   Patient denies diarrhea and constipation.  Constitutional:   Patient denies fever, night sweats, weight loss, and fatigue.  Skin:   Patient denies skin rash/ lesion and itching.  Eyes:   Patient denies blurred vision and double vision.  Ears/ Nose/ Throat:   Patient denies sore throat and sinus problems.  Hematologic/Lymphatic:   Patient denies swollen glands and easy bruising.  Cardiovascular:   Patient denies leg swelling and chest pains.  Respiratory:   Patient denies cough and shortness of breath.  Endocrine:   Patient denies excessive thirst.  Musculoskeletal:   Patient denies back pain and joint pain.  Neurological:   Patient denies dizziness and headaches.   Psychologic:   Patient denies depression and anxiety.   VITAL SIGNS: None   MULTI-SYSTEM PHYSICAL EXAMINATION:    Constitutional: Well-nourished. No physical deformities. Normally developed. Good grooming.  Cardiovascular:  Normal temperature, normal extremity pulses, no swelling, no varicosities.  Skin: No paleness, no jaundice, no cyanosis. No lesion, no ulcer, no rash.  Neurologic / Psychiatric: Oriented to time, oriented to place, oriented to person. No depression, no anxiety, no agitation.  Gastrointestinal: No mass, no tenderness, no rigidity, non obese abdomen.     Complexity of Data:  Source Of History:  Patient  Records Review:   Previous Doctor Records, Previous Patient Records  Urine Test Review:   Urinalysis   06/26/23 11/17/22 04/12/22 10/08/21 03/31/21 07/08/20 12/24/19 07/19/18  PSA  Total PSA 0.15 ng/mL 0.19 ng/mL 0.094 ng/mL 0.13 ng/mL 0.29 ng/mL 2.63 ng/mL 3.71 ng/dl 8.32 ng/dl    93/97/74  Urinalysis  Urine Appearance Turbid   Urine Color Brown   Urine Glucose Neg mg/dL  Urine Bilirubin Neg mg/dL  Urine Ketones Neg mg/dL  Urine Specific Gravity 1.020   Urine Blood 3+ ery/uL  Urine pH 7.0   Urine Protein 3+ mg/dL  Urine Urobilinogen 1.0 mg/dL  Urine Nitrites Positive   Urine Leukocyte Esterase 3+ leu/uL  Urine WBC/hpf 20 - 40/hpf   Urine RBC/hpf 20 - 40/hpf   Urine Epithelial Cells 0 - 5/hpf   Urine Bacteria Few (10-25/hpf)   Urine Mucous Not Present   Urine Yeast NS (Not Seen)   Urine Trichomonas Not Present   Urine Cystals NS (Not Seen)   Urine Casts NS (Not Seen)   Urine Sperm Not Present    PROCEDURES:         Simple Foley Indwelling Cath Change - 51702  The patient's indwelling foley tube was carefully removed. A 16 French Foley catheter was inserted into the bladder using sterile technique. The patient was taught routine catheter care. Hand irrigation of the bladder with sterile water was performed. A leg bag was connected. Bradley cc of urine was  obtained. A urinalysis was sent to the lab. A urine culture was sent to the lab.         Visit Complexity - G2211          Urinalysis w/Scope Dipstick Dipstick Cont'd Micro  Color: Brown Bilirubin: Neg mg/dL WBC/hpf: 20 - 59/yeq  Appearance: Turbid Ketones: Neg mg/dL RBC/hpf: 20 - 59/yeq  Specific Gravity: 1.020 Blood: 3+ ery/uL Bacteria: Few (10-25/hpf)  pH: 7.0 Protein: 3+ mg/dL Cystals: NS (Not Seen)  Glucose: Neg mg/dL Urobilinogen: 1.0 mg/dL Casts: NS (Not Seen)    Nitrites: Positive Trichomonas: Not Present    Leukocyte Esterase: 3+ leu/uL Mucous: Not Present      Epithelial Cells: 0 - 5/hpf      Yeast: NS (Not Seen)      Sperm: Not Present    ASSESSMENT:      ICD-10 Details  1 GU:   Acute Cystitis/UTI - N30.00 Acute, Uncomplicated  2   Incomplete bladder emptying - R39.14 Chronic, Stable  3   Urinary Retention - R33.8 Chronic, Stable   PLAN:           Orders Labs Urinalysis, CULTURE, URINE          Document Letter(s):  Created for Patient: Clinical Summary         Notes:   Urine was sent for culture. Will treat based off culture results and sensitivities. Advised that once cardiologist returns the request for ensuring his heart would be safe to undergo anesthesia, they will call and make his appointment for surgery. I did provide him with Gemtesa for now for the bladder leakage. He will likely  need to stop this after his bladder outlet obstruction procedure prior to his trial of void.

## 2024-01-19 ENCOUNTER — Ambulatory Visit (HOSPITAL_COMMUNITY): Payer: Self-pay | Admitting: Physician Assistant

## 2024-01-19 ENCOUNTER — Observation Stay (HOSPITAL_COMMUNITY)
Admission: RE | Admit: 2024-01-19 | Discharge: 2024-01-20 | Disposition: A | Discharge status: Home / Self Care | Source: Ambulatory Visit | Attending: Urology | Admitting: Urology

## 2024-01-19 ENCOUNTER — Other Ambulatory Visit: Payer: Self-pay

## 2024-01-19 ENCOUNTER — Encounter (HOSPITAL_COMMUNITY)
Admission: RE | Disposition: A | Discharge status: Home / Self Care | Payer: Self-pay | Source: Ambulatory Visit | Attending: Urology

## 2024-01-19 ENCOUNTER — Ambulatory Visit (HOSPITAL_BASED_OUTPATIENT_CLINIC_OR_DEPARTMENT_OTHER): Payer: Self-pay | Admitting: Anesthesiology

## 2024-01-19 ENCOUNTER — Encounter (HOSPITAL_COMMUNITY): Payer: Self-pay | Admitting: Urology

## 2024-01-19 DIAGNOSIS — I679 Cerebrovascular disease, unspecified: Secondary | ICD-10-CM

## 2024-01-19 DIAGNOSIS — I4891 Unspecified atrial fibrillation: Secondary | ICD-10-CM | POA: Diagnosis not present

## 2024-01-19 DIAGNOSIS — N320 Bladder-neck obstruction: Secondary | ICD-10-CM | POA: Insufficient documentation

## 2024-01-19 DIAGNOSIS — N401 Enlarged prostate with lower urinary tract symptoms: Principal | ICD-10-CM | POA: Insufficient documentation

## 2024-01-19 DIAGNOSIS — E119 Type 2 diabetes mellitus without complications: Secondary | ICD-10-CM

## 2024-01-19 DIAGNOSIS — R338 Other retention of urine: Secondary | ICD-10-CM

## 2024-01-19 DIAGNOSIS — R3914 Feeling of incomplete bladder emptying: Secondary | ICD-10-CM | POA: Diagnosis not present

## 2024-01-19 DIAGNOSIS — N138 Other obstructive and reflux uropathy: Principal | ICD-10-CM | POA: Diagnosis present

## 2024-01-19 DIAGNOSIS — R339 Retention of urine, unspecified: Secondary | ICD-10-CM | POA: Insufficient documentation

## 2024-01-19 DIAGNOSIS — N3000 Acute cystitis without hematuria: Secondary | ICD-10-CM | POA: Diagnosis not present

## 2024-01-19 HISTORY — PX: TRANSURETHRAL RESECTION OF PROSTATE: SHX73

## 2024-01-19 LAB — GLUCOSE, CAPILLARY
Glucose-Capillary: 140 mg/dL — ABNORMAL HIGH (ref 70–99)
Glucose-Capillary: 124 mg/dL — ABNORMAL HIGH (ref 70–99)
Glucose-Capillary: 167 mg/dL — ABNORMAL HIGH (ref 70–99)
Glucose-Capillary: 248 mg/dL — ABNORMAL HIGH (ref 70–99)
Glucose-Capillary: 200 mg/dL — ABNORMAL HIGH (ref 70–99)

## 2024-01-19 SURGERY — TURP (TRANSURETHRAL RESECTION OF PROSTATE)
Anesthesia: General

## 2024-01-19 MED ORDER — CARVEDILOL 6.25 MG PO TABS
6.2500 mg | ORAL_TABLET | Freq: Two times a day (BID) | ORAL | Status: DC
  Administered 2024-01-19 – 2024-01-20 (×3): 6.25 mg via ORAL
  Filled 2024-01-19 (×3): qty 1

## 2024-01-19 MED ORDER — PROPOFOL 10 MG/ML IV BOLUS
INTRAVENOUS | Status: AC
  Filled 2024-01-19: qty 20

## 2024-01-19 MED ORDER — MIDAZOLAM HCL 2 MG/2ML IJ SOLN
INTRAMUSCULAR | Status: DC | PRN
  Administered 2024-01-19: 2 mg via INTRAVENOUS

## 2024-01-19 MED ORDER — SODIUM CHLORIDE 0.9 % IR SOLN
3000.0000 mL | Status: DC
  Administered 2024-01-19 (×2): 3000 mL

## 2024-01-19 MED ORDER — MIDAZOLAM HCL 2 MG/2ML IJ SOLN
INTRAMUSCULAR | Status: AC
  Filled 2024-01-19: qty 2

## 2024-01-19 MED ORDER — PROPOFOL 10 MG/ML IV BOLUS
INTRAVENOUS | Status: DC | PRN
  Administered 2024-01-19: 200 mg via INTRAVENOUS

## 2024-01-19 MED ORDER — MORPHINE SULFATE (PF) 2 MG/ML IV SOLN
2.0000 mg | INTRAVENOUS | Status: DC | PRN
  Administered 2024-01-19: 4 mg via INTRAVENOUS
  Filled 2024-01-19: qty 2

## 2024-01-19 MED ORDER — MEPERIDINE HCL 50 MG/ML IJ SOLN: 6.2500 mg | INTRAMUSCULAR | Status: DC | PRN

## 2024-01-19 MED ORDER — INSULIN ASPART 100 UNIT/ML IJ SOLN: 0.0000 [IU] | INTRAMUSCULAR | Status: DC | PRN

## 2024-01-19 MED ORDER — CEFAZOLIN SODIUM-DEXTROSE 2-4 GM/100ML-% IV SOLN
2.0000 g | INTRAVENOUS | Status: AC
  Administered 2024-01-19: 2 g via INTRAVENOUS
  Filled 2024-01-19: qty 100

## 2024-01-19 MED ORDER — CIPROFLOXACIN HCL 500 MG PO TABS
500.0000 mg | ORAL_TABLET | Freq: Two times a day (BID) | ORAL | Status: DC
  Administered 2024-01-19 – 2024-01-20 (×3): 500 mg via ORAL
  Filled 2024-01-19 (×3): qty 1

## 2024-01-19 MED ORDER — POTASSIUM CHLORIDE IN NACL 20-0.45 MEQ/L-% IV SOLN
INTRAVENOUS | Status: AC
Start: 2024-01-19 — End: 2024-01-20
  Filled 2024-01-19: qty 1000

## 2024-01-19 MED ORDER — LIDOCAINE HCL (PF) 2 % IJ SOLN
INTRAMUSCULAR | Status: AC
  Filled 2024-01-19: qty 5

## 2024-01-19 MED ORDER — CHLORHEXIDINE GLUCONATE 0.12 % MT SOLN
15.0000 mL | Freq: Once | OROMUCOSAL | Status: AC
  Administered 2024-01-19: 15 mL via OROMUCOSAL

## 2024-01-19 MED ORDER — ATORVASTATIN CALCIUM 40 MG PO TABS
80.0000 mg | ORAL_TABLET | Freq: Every day | ORAL | Status: DC
  Administered 2024-01-19: 80 mg via ORAL
  Filled 2024-01-19: qty 2

## 2024-01-19 MED ORDER — ORAL CARE MOUTH RINSE: 15.0000 mL | Freq: Once | OROMUCOSAL | Status: AC

## 2024-01-19 MED ORDER — ONDANSETRON HCL 4 MG/2ML IJ SOLN: 4.0000 mg | Freq: Once | INTRAMUSCULAR | Status: DC | PRN

## 2024-01-19 MED ORDER — LIDOCAINE HCL (PF) 2 % IJ SOLN
INTRAMUSCULAR | Status: DC | PRN
  Administered 2024-01-19: 100 mg via INTRADERMAL

## 2024-01-19 MED ORDER — ONDANSETRON HCL 4 MG/2ML IJ SOLN
INTRAMUSCULAR | Status: DC | PRN
  Administered 2024-01-19: 4 mg via INTRAVENOUS

## 2024-01-19 MED ORDER — OXYCODONE HCL 5 MG PO TABS: 5.0000 mg | ORAL_TABLET | Freq: Once | ORAL | Status: DC | PRN

## 2024-01-19 MED ORDER — STERILE WATER FOR IRRIGATION IR SOLN
Status: DC | PRN
  Administered 2024-01-19: 500 mL

## 2024-01-19 MED ORDER — AMLODIPINE BESYLATE 10 MG PO TABS
10.0000 mg | ORAL_TABLET | Freq: Every day | ORAL | Status: DC
  Administered 2024-01-19 – 2024-01-20 (×2): 10 mg via ORAL
  Filled 2024-01-19 (×2): qty 1

## 2024-01-19 MED ORDER — FUROSEMIDE 20 MG PO TABS
20.0000 mg | ORAL_TABLET | Freq: Every day | ORAL | Status: DC
  Administered 2024-01-20: 20 mg via ORAL
  Filled 2024-01-19: qty 1

## 2024-01-19 MED ORDER — FENTANYL CITRATE PF 50 MCG/ML IJ SOSY
25.0000 ug | PREFILLED_SYRINGE | INTRAMUSCULAR | Status: DC | PRN
  Administered 2024-01-19 (×3): 50 ug via INTRAVENOUS

## 2024-01-19 MED ORDER — DEXAMETHASONE SODIUM PHOSPHATE 10 MG/ML IJ SOLN
INTRAMUSCULAR | Status: AC
  Filled 2024-01-19: qty 1

## 2024-01-19 MED ORDER — ONDANSETRON HCL 4 MG/2ML IJ SOLN
INTRAMUSCULAR | Status: AC
  Filled 2024-01-19: qty 2

## 2024-01-19 MED ORDER — FLEET ENEMA RE ENEM
1.0000 | ENEMA | Freq: Once | RECTAL | Status: DC | PRN
Start: 2024-01-19 — End: 2024-01-20

## 2024-01-19 MED ORDER — FENTANYL CITRATE PF 50 MCG/ML IJ SOSY
PREFILLED_SYRINGE | INTRAMUSCULAR | Status: AC
  Filled 2024-01-19: qty 3

## 2024-01-19 MED ORDER — ACETAMINOPHEN 325 MG PO TABS: 650.0000 mg | ORAL_TABLET | ORAL | Status: DC | PRN

## 2024-01-19 MED ORDER — HYDROCODONE-ACETAMINOPHEN 7.5-325 MG PO TABS
1.0000 | ORAL_TABLET | Freq: Four times a day (QID) | ORAL | Status: DC | PRN
  Administered 2024-01-19 – 2024-01-20 (×3): 1 via ORAL
  Filled 2024-01-19 (×3): qty 1

## 2024-01-19 MED ORDER — PROPOFOL 1000 MG/100ML IV EMUL
INTRAVENOUS | Status: AC
Start: 2024-01-19 — End: 2024-01-19
  Filled 2024-01-19: qty 100

## 2024-01-19 MED ORDER — SENNOSIDES-DOCUSATE SODIUM 8.6-50 MG PO TABS: 1.0000 | ORAL_TABLET | Freq: Every evening | ORAL | Status: DC | PRN

## 2024-01-19 MED ORDER — DEXAMETHASONE SODIUM PHOSPHATE 10 MG/ML IJ SOLN
INTRAMUSCULAR | Status: DC | PRN
  Administered 2024-01-19: 4 mg via INTRAVENOUS

## 2024-01-19 MED ORDER — OXYCODONE HCL 5 MG/5ML PO SOLN: 5.0000 mg | Freq: Once | ORAL | Status: DC | PRN

## 2024-01-19 MED ORDER — FENTANYL CITRATE (PF) 100 MCG/2ML IJ SOLN
INTRAMUSCULAR | Status: AC
  Filled 2024-01-19: qty 2

## 2024-01-19 MED ORDER — ONDANSETRON HCL 4 MG/2ML IJ SOLN: 4.0000 mg | INTRAMUSCULAR | Status: DC | PRN

## 2024-01-19 MED ORDER — FENTANYL CITRATE (PF) 100 MCG/2ML IJ SOLN
INTRAMUSCULAR | Status: DC | PRN
  Administered 2024-01-19 (×4): 50 ug via INTRAVENOUS

## 2024-01-19 MED ORDER — ZOLPIDEM TARTRATE 5 MG PO TABS: 5.0000 mg | ORAL_TABLET | Freq: Every evening | ORAL | Status: DC | PRN

## 2024-01-19 MED ORDER — BISACODYL 10 MG RE SUPP: 10.0000 mg | Freq: Every day | RECTAL | Status: DC | PRN

## 2024-01-19 MED ORDER — INSULIN ASPART 100 UNIT/ML IJ SOLN
0.0000 [IU] | Freq: Three times a day (TID) | INTRAMUSCULAR | Status: DC
  Administered 2024-01-19: 5 [IU] via SUBCUTANEOUS
  Administered 2024-01-20: 2 [IU] via SUBCUTANEOUS

## 2024-01-19 MED ORDER — SODIUM CHLORIDE 0.9 % IR SOLN
Status: DC | PRN
  Administered 2024-01-19: 3000 mL via INTRAVESICAL

## 2024-01-19 MED ORDER — LACTATED RINGERS IV SOLN: INTRAVENOUS | Status: DC

## 2024-01-19 SURGICAL SUPPLY — 18 items
BAG URINE DRAIN 2000ML AR STRL (UROLOGICAL SUPPLIES) IMPLANT
BAG URO CATCHER STRL LF (MISCELLANEOUS) ×1 IMPLANT
CATH FOLEY 3WAY 30CC 22FR (CATHETERS) IMPLANT
DRAPE FOOT SWITCH (DRAPES) ×1 IMPLANT
ELECT HOOK LOOP BIPOLAR (NEEDLE) IMPLANT
ELECT REM PT RETURN 15FT ADLT (MISCELLANEOUS) ×1 IMPLANT
GLOVE SURG SS PI 8.0 STRL IVOR (GLOVE) IMPLANT
GOWN STRL SURGICAL XL XLNG (GOWN DISPOSABLE) ×1 IMPLANT
HOLDER FOLEY CATH W/STRAP (MISCELLANEOUS) IMPLANT
KIT TURNOVER KIT A (KITS) ×1 IMPLANT
LOOP CUT BIPOLAR 24F LRG (ELECTROSURGICAL) IMPLANT
MANIFOLD NEPTUNE II (INSTRUMENTS) ×1 IMPLANT
PACK CYSTO (CUSTOM PROCEDURE TRAY) ×1 IMPLANT
PAD PREP 24X48 CUFFED NSTRL (MISCELLANEOUS) ×1 IMPLANT
SYR 30ML LL (SYRINGE) IMPLANT
SYRINGE TOOMEY IRRIG 70ML (MISCELLANEOUS) IMPLANT
TUBING CONNECTING 10 (TUBING) ×1 IMPLANT
TUBING UROLOGY SET (TUBING) ×1 IMPLANT

## 2024-01-19 NOTE — Interval H&P Note (Signed)
 History and Physical Interval Note:  01/19/2024 8:47 AM  Bradley Hull  has presented today for surgery, with the diagnosis of BENIGN PROSTATIC HYPERPLASIA.  The various methods of treatment have been discussed with the patient and family. After consideration of risks, benefits and other options for treatment, the patient has consented to  Procedure(s): TURP (TRANSURETHRAL RESECTION OF PROSTATE) (N/A) as a surgical intervention.  The patient's history has been reviewed, patient examined, no change in status, stable for surgery.  I have reviewed the patient's chart and labs.  Questions were answered to the patient's satisfaction.     [name]

## 2024-01-19 NOTE — Anesthesia Postprocedure Evaluation (Signed)
 Anesthesia Post Note  Patient: Bradley Hull  Procedure(s) Performed: TURP (TRANSURETHRAL RESECTION OF PROSTATE)     Patient location during evaluation: PACU Anesthesia Type: General Level of consciousness: awake and alert Pain management: pain level controlled Vital Signs Assessment: post-procedure vital signs reviewed and stable Respiratory status: spontaneous breathing, nonlabored ventilation, respiratory function stable and patient connected to nasal cannula oxygen Cardiovascular status: blood pressure returned to baseline and stable Postop Assessment: no apparent nausea or vomiting Anesthetic complications: no   No notable events documented.  Last Vitals:  Vitals:   01/19/24 1200 01/19/24 1304  BP: (!) 188/97 (!) 188/97  Pulse: (!) 54   Resp: 12   Temp: 36.6 C   SpO2: 100%     Last Pain:  Vitals:   01/19/24 1303  TempSrc:   PainSc: 9                  Paeton Studer

## 2024-01-19 NOTE — Anesthesia Procedure Notes (Signed)
 Procedure Name: LMA Insertion Date/Time: 01/19/2024 9:24 AM  Performed by: Brandy Almarie BROCKS, CRNAPre-anesthesia Checklist: Patient identified, Emergency Drugs available, Patient being monitored and Suction available Patient Re-evaluated:Patient Re-evaluated prior to induction Oxygen Delivery Method: Circle system utilized Preoxygenation: Pre-oxygenation with 100% oxygen Induction Type: IV induction LMA: LMA with gastric port inserted LMA Size: 4.0 Number of attempts: 1 Dental Injury: Teeth and Oropharynx as per pre-operative assessment  Comments: LMA placed by paramedic student with observation by Dr. Mallory

## 2024-01-19 NOTE — Transfer of Care (Signed)
 Immediate Anesthesia Transfer of Care Note  Patient: Bradley Hull  Procedure(s) Performed: TURP (TRANSURETHRAL RESECTION OF PROSTATE)  Patient Location: PACU  Anesthesia Type:General  Level of Consciousness: drowsy  Airway & Oxygen Therapy: Patient Spontanous Breathing and Patient connected to face mask oxygen  Post-op Assessment: Report given to RN, Post -op Vital signs reviewed and stable, and Patient moving all extremities X 4  Post vital signs: Reviewed and stable  Last Vitals:  Vitals Value Taken Time  BP 96/66   Temp    Pulse 51 01/19/24 10:11  Resp 13 01/19/24 10:11  SpO2 99 % 01/19/24 10:11  Vitals shown include unfiled device data.  Last Pain:  Vitals:   01/19/24 0720  TempSrc:   PainSc: 0-No pain      Patients Stated Pain Goal: 5 (01/19/24 0720)  Complications: No notable events documented.

## 2024-01-19 NOTE — Anesthesia Preprocedure Evaluation (Addendum)
 Anesthesia Evaluation  Patient identified by MRN, date of birth, ID band Patient awake    Reviewed: Allergy & Precautions, NPO status , Patient's Chart, lab work & pertinent test results, reviewed documented beta blocker date and time   History of Anesthesia Complications (+) history of anesthetic complications  Airway Mallampati: I  TM Distance: >3 FB Neck ROM: Full    Dental  (+) Edentulous Lower, Edentulous Upper   Pulmonary Current Smoker and Patient abstained from smoking.   breath sounds clear to auscultation       Cardiovascular hypertension, Pt. on medications and Pt. on home beta blockers  Rhythm:Regular Rate:Normal   Echo 01/04/2024 1. Left ventricular ejection fraction, by estimation, is 60 to 65%. Left  ventricular ejection fraction by 3D volume is 60 %. The left ventricle has  normal function. The left ventricle has no regional wall motion  abnormalities. There is mild concentric  left ventricular hypertrophy. Left ventricular diastolic parameters are  consistent with Grade I diastolic dysfunction (impaired relaxation).   2. Right ventricular systolic function is normal. The right ventricular  size is normal.   3. The mitral valve is normal in structure. Trivial mitral valve  regurgitation. No evidence of mitral stenosis.   4. The aortic valve is tricuspid. There is mild calcification of the  aortic valve. Aortic valve regurgitation is trivial. Aortic valve  sclerosis/calcification is present, without any evidence of aortic  stenosis.   5. The inferior vena cava is normal in size with greater than 50%  respiratory variability, suggesting right atrial pressure of 3 mmHg.     Neuro/Psych  PSYCHIATRIC DISORDERS       Neuromuscular disease CVA    GI/Hepatic ,GERD  Medicated,,(+) Cirrhosis       , Hepatitis -, C  Endo/Other  diabetes, Type 2    Renal/GU Renal disease     Musculoskeletal  (+) Arthritis ,     Abdominal   Peds  Hematology  (+) Blood dyscrasia, anemia   Anesthesia Other Findings   Reproductive/Obstetrics                             Anesthesia Physical Anesthesia Plan  ASA: 3  Anesthesia Plan: General   Post-op Pain Management:    Induction: Intravenous  PONV Risk Score and Plan: 1 and Ondansetron  and Dexamethasone   Airway Management Planned: Oral ETT and LMA  Additional Equipment: None  Intra-op Plan:   Post-operative Plan: Extubation in OR  Informed Consent: I have reviewed the patients History and Physical, chart, labs and discussed the procedure including the risks, benefits and alternatives for the proposed anesthesia with the patient or authorized representative who has indicated his/her understanding and acceptance.     Dental advisory given  Plan Discussed with: CRNA and Anesthesiologist  Anesthesia Plan Comments: (DISCUSSION:73 y.o. smoker with h/o HTN, DM II (A1C 6.6), treated Hep C, atrial fibrillation, BPH scheduled for above procedure 01/19/2024 with Dr. Norleen Seltzer.    Per cardiology preoperative evaluation 12/29/2023, Chart reviewed as part of pre-operative protocol coverage. Given past medical history and time since last visit, based on ACC/AHA guidelines, KEYANTE DURIO is at acceptable risk for the planned procedure without further cardiovascular testing.    Pt was seen in clinic on 12/21/2023 by Lum Louis, NP. Per Madision Louis, NP, He was able to complete greater than 4 METS without symptoms.  Okay to proceed with surgery.   Regarding ASA therapy, we recommend  continuation of ASA throughout the perioperative period.  However, if the surgeon feels that cessation of ASA is required in the perioperative period, it may be stopped 5-7 days prior to surgery with a plan to resume it as soon as felt to be feasible from a surgical standpoint in the post-operative period. )       Anesthesia Quick  Evaluation

## 2024-01-19 NOTE — Progress Notes (Signed)
 Patient ID: Bradley Hull, male   DOB: January 27, 1951, 73 y.o.   MRN: [id number]  He is without complaints this afternoon.  BP (!) 188/97 (BP Location: Left Arm)   Pulse (!) 54   Temp 97.8 F (36.6 C) (Oral)   Resp 12   Ht 5' 6 (1.676 m)   Wt 106.3 kg   SpO2 100%   BMI 37.83 kg/m    Urine is clear on minimal CBI.     Plan for TOV in AM.

## 2024-01-19 NOTE — Op Note (Signed)
 Preoperative diagnosis: Bladder outlet obstruction secondary to BPH  Postoperative diagnosis:  Bladder outlet obstruction secondary to BPH  Procedure:  Cystoscopy Transurethral Resection of the prostate  Surgeon: Norleen Seltzer. M.D.  Anesthesia: general  Complications: None  EBL: 20ml  Specimens: Prostate chips  Disposition of specimens: Pathology  Indication: Bradley Hull is a patient with bladder outlet obstruction secondary to benign prostatic hyperplasia with a history of prostate cancer and prior Exrt in 2022. After reviewing the management options for treatment, he elected to proceed with the above surgical procedure(s). We have discussed the potential benefits and risks of the procedure, side effects of the proposed treatment, the likelihood of the patient achieving the goals of the procedure, and any potential problems that might occur during the procedure or recuperation. Informed consent has been obtained.  Description of procedure:  The patient was taken to the operating room and general anesthesia was induced.  The patient was placed in the dorsal lithotomy position, prepped and draped in the usual sterile fashion, and preoperative antibiotics were administered. A preoperative time-out was performed.   Cystourethroscopy was performed.  The patient's urethra was examined and was short with lateral lobe enlargement with some coaptation and bladder neck elevation.   The bladder was then systematically examined in its entirety. There was marked erythema and edema from the foley but only mild trabeculation with no evidence of any bladder tumors or stones.  The ureteral orifices were identified and marked so as to be avoided during the procedure.  The prostate adenoma was then resected utilizing loop cautery resection with the bipolar cutting loop.  The prostate adenoma from the bladder neck back to the verumontanum was resected beginning at the six o'clock position and then  extended to include the right and left lobes of the prostate and anterior prostate. Care was taken not to resect distal to the verumontanum.  At the completion of the procedure the bladder was evacuated free of chips and hemostasis was insured.  Final inspection revealed intact ureteral orifices, a widely patent TUR channel and an intact external sphincter.   Hemostasis was then achieved with the cautery and the bladder was emptied and reinspected with no significant bleeding noted at the end of the procedure.    A 53fr 3 way catheter was then placed into the bladder and placed on continuous bladder irrigation.  The patient appeared to tolerate the procedure well and without complications.  The patient was able to be awakened and transferred to the recovery unit in satisfactory condition.

## 2024-01-20 ENCOUNTER — Encounter (HOSPITAL_COMMUNITY): Payer: Self-pay | Admitting: Urology

## 2024-01-20 DIAGNOSIS — N401 Enlarged prostate with lower urinary tract symptoms: Secondary | ICD-10-CM | POA: Diagnosis not present

## 2024-01-20 LAB — GLUCOSE, CAPILLARY
Glucose-Capillary: 141 mg/dL — ABNORMAL HIGH (ref 70–99)
Glucose-Capillary: 114 mg/dL — ABNORMAL HIGH (ref 70–99)
Glucose-Capillary: 111 mg/dL — ABNORMAL HIGH (ref 70–99)

## 2024-01-20 LAB — HIV ANTIBODY (ROUTINE TESTING W REFLEX): HIV Screen 4th Generation wRfx: NONREACTIVE

## 2024-01-20 MED ORDER — HYDROCODONE-ACETAMINOPHEN 7.5-325 MG PO TABS: 1.0000 | ORAL_TABLET | Freq: Four times a day (QID) | ORAL | 0 refills | Status: AC | PRN

## 2024-01-20 NOTE — Plan of Care (Signed)
  Problem: Education: Goal: Knowledge of General Education information will improve Description: Including pain rating scale, medication(s)/side effects and non-pharmacologic comfort measures Outcome: Adequate for Discharge   

## 2024-01-20 NOTE — Progress Notes (Signed)
 Pt discharged to home. DC instructions given. Pt verbalized no concerns. Left unit in wheelchair pushed by Lynna, NT. Left in stable condition. Pt's son in to take pt home.

## 2024-01-20 NOTE — Discharge Instructions (Signed)
1 - You may have urinary urgency (bladder spasms) and bloody urine on / off for up to 2 weeks.  This is normal.  2 - Call MD or go to ER for fever >102, severe pain / nausea / vomiting not relieved by medications, or acute change in medical status  

## 2024-01-20 NOTE — Care Management Obs Status (Signed)
 MEDICARE OBSERVATION STATUS NOTIFICATION   Patient Details  Name: Bradley Hull MRN: [id number] Date of Birth: Nov 26, 1950   Medicare Observation Status Notification Given:  Yes    Sheri ONEIDA Sharps, LCSW 01/20/2024, 12:06 PM

## 2024-01-20 NOTE — Discharge Summary (Signed)
 Physician Discharge Summary  Patient ID: Bradley Hull MRN: [id number] DOB/AGE: 1950/08/07 73 y.o.  Admit date: 01/19/2024 Discharge date: 01/20/2024  Admission Diagnoses:  Discharge Diagnoses:  Principal Problem:   BPH with obstruction/lower urinary tract symptoms   Discharged Condition: good  Hospital Course: Pt underwent transurethral resection of prostate on 01/19/24, the day of admission, without acute complication. Observed on 4th floor Urology service post-op. Catheter removed early AM POD 1. By the afternoon of POD 1, he is ambulatory, pain controlled on PO meds, voiding with minimal hematuria, and felt to be adequate for discharge. Final path pending at discharge.   Consults: None  Significant Diagnostic Studies: labs: as per abvoe  Treatments: surgery: as per above  Discharge Exam: Blood pressure 139/75, pulse (!) 55, temperature 98.5 F (36.9 C), resp. rate 18, height 5' 6 (1.676 m), weight 106.3 kg, SpO2 99%.  NAD, AOx3, pleasant  Non-labored breathing on RA RRR Moderate obesity, no CVAT No foley No c/c/e   Disposition: HOME     Follow-up Information     Nicholaus Sherlyn CROME, NP Follow up on 02/09/2024.   Why: 1130 Contact information: [address] [place] [address] [phone]                 Signed: Ricardo KATHEE Alvaro Mickey. 01/20/2024, 1:48 PM

## 2024-01-22 LAB — SURGICAL PATHOLOGY

## 2024-01-31 ENCOUNTER — Other Ambulatory Visit: Payer: Self-pay | Admitting: Internal Medicine

## 2024-01-31 DIAGNOSIS — E119 Type 2 diabetes mellitus without complications: Secondary | ICD-10-CM

## 2024-02-07 ENCOUNTER — Ambulatory Visit: Admitting: Physical Medicine and Rehabilitation

## 2024-02-08 ENCOUNTER — Ambulatory Visit
Admission: RE | Admit: 2024-02-08 | Discharge: 2024-02-08 | Disposition: A | Discharge status: Home / Self Care | Source: Ambulatory Visit | Attending: Nurse Practitioner | Admitting: Nurse Practitioner

## 2024-02-08 DIAGNOSIS — I8510 Secondary esophageal varices without bleeding: Secondary | ICD-10-CM

## 2024-02-08 DIAGNOSIS — K7469 Other cirrhosis of liver: Secondary | ICD-10-CM

## 2024-02-14 ENCOUNTER — Ambulatory Visit (INDEPENDENT_AMBULATORY_CARE_PROVIDER_SITE_OTHER): Payer: 59

## 2024-02-14 ENCOUNTER — Telehealth: Payer: Self-pay

## 2024-02-14 VITALS — Ht 66.0 in | Wt 234.0 lb

## 2024-02-14 DIAGNOSIS — Z0000 Encounter for general adult medical examination without abnormal findings: Secondary | ICD-10-CM | POA: Diagnosis not present

## 2024-02-14 DIAGNOSIS — Z794 Long term (current) use of insulin: Secondary | ICD-10-CM

## 2024-02-14 DIAGNOSIS — E119 Type 2 diabetes mellitus without complications: Secondary | ICD-10-CM | POA: Diagnosis not present

## 2024-02-14 DIAGNOSIS — K635 Polyp of colon: Secondary | ICD-10-CM

## 2024-02-14 NOTE — Patient Instructions (Addendum)
 Bradley Hull , Thank you for taking time out of your busy schedule to complete your Annual Wellness Visit with me. I enjoyed our conversation and look forward to speaking with you again next year. I, as well as your care team,  appreciate your ongoing commitment to your health goals. Please review the following plan we discussed and let me know if I can assist you in the future. Your Game plan/ To Do List    Referrals: If you haven't heard from the office you've been referred to, please reach out to them at the phone provided.  Referral to [facility] for a repeat Colonoscopy; Ordered a Diabetic Kidney Urine ACR (Lab) Follow up Visits: Next Medicare AWV with our clinical staff: 02/21/2025 at 9:30a at the office   Have you seen your provider in the last 6 months (3 months if uncontrolled diabetes)? Yes Next Office Visit with your provider: 04/23/2024 at 9:30am with Dr Joshua  Clinician Recommendations:  Aim for 30 minutes of exercise or brisk walking, 6-8 glasses of water , and 5 servings of fruits and vegetables each day. Educated an advised on getting the Hepatitis B vaccines and Shingles vaccines in 2025.      This is a list of the screening recommended for you and due dates:  Health Maintenance  Topic Date Due   Yearly kidney health urinalysis for diabetes  Never done   Zoster (Shingles) Vaccine (1 of 2) Never done   Hepatitis B Vaccine (1 of 3 - Risk 3-dose series) Never done   Colon Cancer Screening  09/10/2023   Flu Shot  02/23/2024   Complete foot exam   04/09/2024   Hemoglobin A1C  07/13/2024   Eye exam for diabetics  11/15/2024   Screening for Lung Cancer  12/07/2024   Yearly kidney function blood test for diabetes  01/11/2025   Medicare Annual Wellness Visit  02/13/2025   DTaP/Tdap/Td vaccine (2 - Td or Tdap) 07/03/2030   Pneumococcal Vaccine for age over 71  Completed   Hepatitis C Screening  Completed   HPV Vaccine  Aged Out   Meningitis B Vaccine  Aged Out   COVID-19 Vaccine   Discontinued    Advanced directives: (Declined) Advance directive discussed with you today. Even though you declined this today, please call our office should you change your mind, and we can give you the proper paperwork for you to fill out. Advance Care Planning is important because it:  [x]  Makes sure you receive the medical care that is consistent with your values, goals, and preferences  [x]  It provides guidance to your family and loved ones and reduces their decisional burden about whether or not they are making the right decisions based on your wishes.  Follow the link provided in your after visit summary or read over the paperwork we have mailed to you to help you started getting your Advance Directives in place. If you need assistance in completing these, please reach out to us  so that we can help you!

## 2024-02-14 NOTE — Progress Notes (Signed)
 Subjective:   Bradley Hull is a 73 y.o. who presents for a Medicare Wellness preventive visit.  As a reminder, Annual Wellness Visits don't include a physical exam, and some assessments may be limited, especially if this visit is performed virtually. We may recommend an in-person follow-up visit with your provider if needed.  Visit Complete: Virtual I connected with  Bradley Hull on 02/14/24 by a audio enabled telemedicine application and verified that I am speaking with the correct person using two identifiers.  Patient Location: Home  Provider Location: Office/Clinic  I discussed the limitations of evaluation and management by telemedicine. The patient expressed understanding and agreed to proceed.  Vital Signs: Because this visit was a virtual/telehealth visit, some criteria may be missing or patient reported. Any vitals not documented were not able to be obtained and vitals that have been documented are patient reported.  VideoDeclined- This patient declined Librarian, academic. Therefore the visit was completed with audio only.  Persons Participating in Visit: Patient.  AWV Questionnaire: No: Patient Medicare AWV questionnaire was not completed prior to this visit.  Cardiac Risk Factors include: advanced age (>2men, >44 women);diabetes mellitus;dyslipidemia;hypertension;male gender;obesity (BMI >30kg/m2);smoking/ tobacco exposure     Objective:    Today's Vitals   02/14/24 1525  Weight: 234 lb (106.1 kg)  Height: 5' 6 (1.676 m)   Body mass index is 37.77 kg/m.     02/14/2024    3:25 PM 01/19/2024    7:22 AM 01/12/2024   10:30 AM 02/13/2023    4:00 PM 07/11/2022    1:03 AM 07/08/2022    9:09 AM 06/27/2022    1:39 PM  Advanced Directives  Does Patient Have a Medical Advance Directive? No No No No No No No  Would patient like information on creating a medical advance directive? No - Patient declined No - Patient declined Yes  (MAU/Ambulatory/Procedural Areas - Information given) No - Patient declined Yes (ED - Information included in AVS) No - Patient declined No - Patient declined    Current Medications (verified) Outpatient Encounter Medications as of 02/14/2024  Medication Sig   amLODipine  (NORVASC ) 10 MG tablet Take 1 tablet by mouth once daily   aspirin  EC 81 MG tablet Take 81 mg by mouth daily. Swallow whole.   atorvastatin  (LIPITOR) 80 MG tablet Take 1 tablet (80 mg total) by mouth at bedtime.   carvedilol  (COREG ) 6.25 MG tablet TAKE 1 TABLET BY MOUTH TWICE DAILY WITH A MEAL   Continuous Glucose Receiver (FREESTYLE LIBRE 2 READER) DEVI Use as directed to check blood sugars   Continuous Glucose Sensor (FREESTYLE LIBRE 2 SENSOR) MISC USE AS DIRECTED   furosemide  (LASIX ) 20 MG tablet Take 1 tablet (20 mg total) by mouth daily.   Glucagon  (GVOKE HYPOPEN  2-PACK) 1 MG/0.2ML SOAJ Inject 1 Act into the skin daily as needed.   HYDROcodone -acetaminophen  (NORCO) 7.5-325 MG tablet Take 1 tablet by mouth every 6 (six) hours as needed for moderate pain (pain score 4-6) or severe pain (pain score 7-10) (post-operatively).   Insulin  Glargine-Lixisenatide  (SOLIQUA ) 100-33 UNT-MCG/ML SOPN Inject 20 Units into the skin daily.   No facility-administered encounter medications on file as of 02/14/2024.    Allergies (verified) Dilaudid  [hydromorphone  hcl] and Hydromorphone    History: Past Medical History:  Diagnosis Date   A-fib [facility])    Arthritis    Benign prostatic hyperplasia    Cirrhosis (HCC)    Colon polyps    Complication of anesthesia  bp droppped and heart rate sped up after 09-09-2020 endoscopy /colonscopy   DM type 2 (diabetes mellitus, type 2) (HCC)    Hepatitis C    took treatment for 2018 issue resolved per pt   History of kidney stones    Hypertension    Nicotine  addiction    Obesity    Osteoarthritis of both knees    Prostate cancer (HCC)    Renal lithiasis    Stroke [facility])    Past Surgical  History:  Procedure Laterality Date   COLONOSCOPY  09/09/2020   [facility]   CYSTOSCOPY  11/17/2020   Procedure: CYSTOSCOPY FLEXIBLE;  Surgeon: Watt Rush, MD;  Location: [facility];  Service: Urology;;   CYSTOSCOPY/URETEROSCOPY/HOLMIUM LASER/STENT PLACEMENT Right 07/08/2022   Procedure: CYSTOSCOPY RIGHT RETROGRADE PYELOGRAM /URETEROSCOPY/HOLMIUM LASER/STENT PLACEMENT;  Surgeon: Watt Rush, MD;  Location: [facility];  Service: Urology;  Laterality: Right;  1 HR   ESOPHAGOGASTRODUODENOSCOPY  2016   KIDNEY STONE SURGERY Right yrs ago   LITHOTRIPSY Left 1986   MULTIPLE EXTRACTIONS WITH ALVEOLOPLASTY N/A 09/09/2016   Procedure: MULTIPLE EXTRACTION WITH ALVEOLOPLASTY - one, two, six, seven, nine, ten, eleven, sixteen, seventeen, twenty, twenty-one, twenty-two, twenty-four, twenty-five, twenty-six, twenty-seven, twenty-eight, twenty-nine, thirty-one, thirty-two; alveolplasty; removal bilateral mandible turi; removal cyst right mandible;  bone graft;  Surgeon: Glendia Primrose, DDS;  Location: [facility];  Service: Oral S   PILONIDAL CYST EXCISION  yrs ago   from spine   SPACE OAR INSTILLATION N/A 11/17/2020   Procedure: SPACE OAR INSTILLATION FIDUCIAL MARKER;  Surgeon: Watt Rush, MD;  Location: [facility];  Service: Urology;  Laterality: N/A;  prostate   TOTAL KNEE ARTHROPLASTY Bilateral 03/2020   TRANSURETHRAL RESECTION OF PROSTATE N/A 01/19/2024   Procedure: TURP (TRANSURETHRAL RESECTION OF PROSTATE);  Surgeon: Watt Rush, MD;  Location: [facility];  Service: Urology;  Laterality: N/A;   UPPER GASTROINTESTINAL ENDOSCOPY  08/30/2020   [facility]    WISDOM TOOTH EXTRACTION     x4 removed   Family History  Problem Relation Age of Onset   Lung cancer Mother        mets   Diabetes Mother    Breast cancer Mother    Diabetes Father    Hyperlipidemia Father    Colon cancer Father    Diabetes Sister    Hepatitis Brother    Schizophrenia Brother    Diabetes Sister    Diabetes Sister     Diabetes Brother    Diabetes Brother    Diabetes Maternal Grandmother    Esophageal cancer Neg Hx    Rectal cancer Neg Hx    Stomach cancer Neg Hx    Social History   Socioeconomic History   Marital status: Single    Spouse name: Not on file   Number of children: 2   Years of education: Not on file   Highest education level: Not on file  Occupational History   Occupation: retired  Tobacco Use   Smoking status: Every Day    Current packs/day: 0.25    Average packs/day: 0.5 packs/day for 51.6 years (25.6 ttl pk-yrs)    Types: Cigarettes    Start date: 2025    Passive exposure: Current   Smokeless tobacco: Never   Tobacco comments:    Pt smokes 1 pack every other day  Vaping Use   Vaping status: Never Used  Substance and Sexual Activity   Alcohol use: Yes    Alcohol/week: 3.0 standard drinks of alcohol  Types: 3 Cans of beer per week    Comment: occas   Drug use: Never   Sexual activity: Yes  Other Topics Concern   Not on file  Social History Narrative   Not on file   Social Drivers of Health   Financial Resource Strain: Low Risk  (02/14/2024)   Overall Financial Resource Strain (CARDIA)    Difficulty of Paying Living Expenses: Not hard at all  Food Insecurity: No Food Insecurity (02/14/2024)   Hunger Vital Sign    Worried About Running Out of Food in the Last Year: Never true    Ran Out of Food in the Last Year: Never true  Transportation Needs: No Transportation Needs (02/14/2024)   PRAPARE - Administrator, Civil Service (Medical): No    Lack of Transportation (Non-Medical): No  Physical Activity: Inactive (02/14/2024)   Exercise Vital Sign    Days of Exercise per Week: 0 days    Minutes of Exercise per Session: 0 min  Stress: Stress Concern Present (02/14/2024)   [employer] of Occupational Health - Occupational Stress Questionnaire    Feeling of Stress: To some extent  Social Connections: Moderately Isolated (02/14/2024)   Social  Connection and Isolation Panel    Frequency of Communication with Friends and Family: Three times a week    Frequency of Social Gatherings with Friends and Family: Three times a week    Attends Religious Services: 1 to 4 times per year    Active Member of Clubs or Organizations: No    Attends Banker Meetings: Never    Marital Status: Never married    Tobacco Counseling Ready to quit: No Counseling given: No Tobacco comments: Pt smokes 1 pack every other day    Clinical Intake:  Pre-visit preparation completed: Yes  Pain : No/denies pain     BMI - recorded: 37.77 Nutritional Risks: None Diabetes: Yes CBG done?: Yes CBG resulted in Enter/ Edit results?: Yes (fasting - 118) Did pt. bring in CBG monitor from home?: No  Lab Results  Component Value Date   HGBA1C 6.6 (H) 01/12/2024   HGBA1C 6.8 (H) 11/16/2023   HGBA1C 6.8 (H) 04/10/2023     How often do you need to have someone help you when you read instructions, pamphlets, or other written materials from your doctor or pharmacy?: 1 - Never  Interpreter Needed?: No  Information entered by :: Verdie Saba, CMA   Activities of Daily Living     02/14/2024    3:30 PM 01/19/2024   11:21 PM  In your present state of health, do you have any difficulty performing the following activities:  Hearing? 0 0  Vision? 0 0  Difficulty concentrating or making decisions? 0 0  Walking or climbing stairs? 0   Dressing or bathing? 0   Doing errands, shopping? 0   Preparing Food and eating ? N   Using the Toilet? N   In the past six months, have you accidently leaked urine? Y   Do you have problems with loss of bowel control? N   Managing your Medications? N   Managing your Finances? N   Housekeeping or managing your Housekeeping? N     Patient Care Team: Joshua Debby CROME, MD as PCP - General (Internal Medicine) Elmira Newman PARAS, MD as PCP - Cardiology (Cardiology) Watt Rush, MD as Attending Physician  (Urology) Dannial Hacker, MD as Referring Physician (Pain Medicine) Patrcia Cough, MD as Consulting Physician (Radiation Oncology) Crawford Jacobsen  Cornetto, NP as Nurse Practitioner (Hematology and Oncology) Starla Wendelyn BIRCH, RN as Registered Nurse [facility] as Consulting Physician (Ophthalmology)  I have updated your Care Teams any recent Medical Services you may have received from other providers in the past year.     Assessment:   This is a routine wellness examination for [facility].  Hearing/Vision screen Hearing Screening - Comments:: Denies hearing difficulties   Vision Screening - Comments:: Denies vision concerns - appt w/Andrew Apple, DO in 02/2024   Goals Addressed               This Visit's Progress     Patient Stated (pt-stated)        Patient stated that he is looking forward to having a birthday in 2 days.  Just want to manage health and continue to heal.       Depression Screen     02/14/2024    3:32 PM 01/17/2024   10:01 AM 11/16/2023    1:08 PM 02/13/2023    3:59 PM 04/12/2022   10:42 AM 01/27/2022    8:30 AM 01/26/2022   12:33 PM  PHQ 2/9 Scores  PHQ - 2 Score 0 0 5 0 0    PHQ- 9 Score 0  14 0 0       Information is confidential and restricted. Go to Review Flowsheets to unlock data.    Fall Risk     02/14/2024    3:32 PM 01/17/2024   10:00 AM 11/16/2023    1:08 PM 02/13/2023    3:46 PM 04/12/2022   10:42 AM  Fall Risk   Falls in the past year? 0 0 1 0 0  Number falls in past yr: 0 0 1 0 0  Injury with Fall? 0 0 0 0 0  Risk for fall due to : No Fall Risks No Fall Risks No Fall Risks No Fall Risks No Fall Risks  Follow up Falls evaluation completed;Falls prevention discussed Falls evaluation completed Falls evaluation completed Falls prevention discussed Falls evaluation completed      Data saved with a previous flowsheet row definition    MEDICARE RISK AT HOME:  Medicare Risk at Home Any stairs in or around the home?: Yes If so, are  there any without handrails?: No Home free of loose throw rugs in walkways, pet beds, electrical cords, etc?: Yes Adequate lighting in your home to reduce risk of falls?: Yes Life alert?: No Use of a cane, walker or w/c?: No Grab bars in the bathroom?: Yes Shower chair or bench in shower?: Yes Elevated toilet seat or a handicapped toilet?: Yes  TIMED UP AND GO:  Was the test performed?  No  Cognitive Function: 6CIT completed        02/14/2024    3:38 PM 02/13/2023    3:48 PM  6CIT Screen  What Year? 0 points 0 points  What month? 0 points 0 points  What time? 0 points 0 points  Count back from 20 0 points 0 points  Months in reverse 0 points 0 points  Repeat phrase 0 points 0 points  Total Score 0 points 0 points    Immunizations Immunization History  Administered Date(s) Administered   Fluad Quad(high Dose 65+) 04/12/2022, 04/07/2023   Hepatitis A, Adult 05/13/2015   Influenza,inj,Quad PF,6+ Mos 05/13/2015   Influenza-Unspecified 07/26/2015, 04/28/2020, 04/08/2021   PFIZER(Purple Top)SARS-COV-2 Vaccination 08/19/2019, 09/09/2019, 06/12/2020   Pfizer Covid-19 Vaccine Bivalent Booster 63yrs & up 04/28/2021   Pneumococcal  Conjugate-13 07/03/2020   Pneumococcal Polysaccharide-23 07/27/2021   Tdap 07/03/2020    Screening Tests Health Maintenance  Topic Date Due   Diabetic kidney evaluation - Urine ACR  Never done   Zoster Vaccines- Shingrix  (1 of 2) Never done   Hepatitis B Vaccines (1 of 3 - Risk 3-dose series) Never done   Colonoscopy  09/10/2023   INFLUENZA VACCINE  02/23/2024   FOOT EXAM  04/09/2024   HEMOGLOBIN A1C  07/13/2024   OPHTHALMOLOGY EXAM  11/15/2024   Lung Cancer Screening  12/07/2024   Diabetic kidney evaluation - eGFR measurement  01/11/2025   Medicare Annual Wellness (AWV)  02/13/2025   DTaP/Tdap/Td (2 - Td or Tdap) 07/03/2030   Pneumococcal Vaccine: 50+ Years  Completed   Hepatitis C Screening  Completed   HPV VACCINES  Aged Out    Meningococcal B Vaccine  Aged Out   COVID-19 Vaccine  Discontinued    Health Maintenance  Health Maintenance Due  Topic Date Due   Diabetic kidney evaluation - Urine ACR  Never done   Zoster Vaccines- Shingrix  (1 of 2) Never done   Hepatitis B Vaccines (1 of 3 - Risk 3-dose series) Never done   Colonoscopy  09/10/2023   Health Maintenance Items Addressed:  Referral sent to GI for colonoscopy, Labs Ordered: Diabetic Kidney Urine ACR  Additional Screening:  Vision Screening: Recommended annual ophthalmology exams for early detection of glaucoma and other disorders of the eye. Would you like a referral to an eye doctor? No  Patient has an appt w/Andrew Apple, DO of [facility] in 02/2024.  Dental Screening: Recommended annual dental exams for proper oral hygiene  Community Resource Referral / Chronic Care Management: CRR required this visit?  No   CCM required this visit?  No   Plan:    I have personally reviewed and noted the following in the patient's chart:   Medical and social history Use of alcohol, tobacco or illicit drugs  Current medications and supplements including opioid prescriptions. Patient is not currently taking opioid prescriptions. Functional ability and status Nutritional status Physical activity Advanced directives List of other physicians Hospitalizations, surgeries, and ER visits in previous 12 months Vitals Screenings to include cognitive, depression, and falls Referrals and appointments  In addition, I have reviewed and discussed with patient certain preventive protocols, quality metrics, and best practice recommendations. A written personalized care plan for preventive services as well as general preventive health recommendations were provided to patient.   Verdie CHRISTELLA Saba, CMA   02/14/2024   After Visit Summary: (Mail) Due to this being a telephonic visit, the after visit summary with patients personalized plan was offered to patient via  mail   Notes: Nothing significant to report at this time.

## 2024-02-14 NOTE — Telephone Encounter (Addendum)
 Hello Dr. San and Norleen,  This patient has a PV on 8/1 and a colonoscopy-HOCP- scheduled w/ Dr. JAYSON on 8/18 @ [facility].  He is s/p TURP on 6/27, ED visit on 7/18 (cystitis).  Also had an office visit with [facility] on 6/23 (Cirrhosis of Liver, esophageal varices).  Last colonoscopy on 08/2020 w/ Dr. JAYSON, went into Afib and was transported to [facility].  He did have a stroke 11/22.  Please review his chart and advise if he is still a candidate for [facility], OV, hospital, etc. Thank you both, Elyna Pangilinan

## 2024-02-16 ENCOUNTER — Other Ambulatory Visit (INDEPENDENT_AMBULATORY_CARE_PROVIDER_SITE_OTHER)

## 2024-02-16 DIAGNOSIS — Z794 Long term (current) use of insulin: Secondary | ICD-10-CM

## 2024-02-16 DIAGNOSIS — E119 Type 2 diabetes mellitus without complications: Secondary | ICD-10-CM

## 2024-02-16 LAB — MICROALBUMIN / CREATININE URINE RATIO
Creatinine,U: 454.2 mg/dL
Microalb Creat Ratio: 132.9 mg/g — ABNORMAL HIGH (ref 0.0–30.0)
Microalb, Ur: 60.4 mg/dL — ABNORMAL HIGH (ref 0.0–1.9)

## 2024-02-23 ENCOUNTER — Telehealth: Payer: Self-pay | Admitting: Internal Medicine

## 2024-02-23 ENCOUNTER — Encounter

## 2024-02-23 NOTE — Telephone Encounter (Signed)
 Please make comments on his lab results so that I can call him back.

## 2024-02-23 NOTE — Telephone Encounter (Signed)
 He has signs of diabetic kidney damage

## 2024-02-23 NOTE — Telephone Encounter (Signed)
 Copied from CRM [id number]. Topic: Clinical - Lab/Test Results >> Feb 23, 2024  9:20 AM Thersia BROCKS wrote: Reason for CRM: Patient called in would like to know the results of his urinalysis that he had last Friday , would like for a nurse to give him a callback

## 2024-02-23 NOTE — Telephone Encounter (Signed)
 Unable to reach [place]. LMTRC

## 2024-02-23 NOTE — Telephone Encounter (Signed)
 Patient has been made aware and gave a verbal understanding.

## 2024-02-26 NOTE — Telephone Encounter (Signed)
 Bradley Hull,   Please see attached

## 2024-02-27 NOTE — Telephone Encounter (Signed)
 Called Bradley Hull and left him a detailed message for scheduling the EGD together with his Colonoscopy. Advised him it would need to be a different day, provided options for 03-26-24 or 03-28-24. Also offered 04-01-24 or 04-03-24.Advised him appointments go very quickly and to please call us  back as soon as possible, other wise appointment might not be available anymore.

## 2024-02-27 NOTE — Telephone Encounter (Signed)
 Patient has been scheduled for 9/4. Patient requested for me to note that after last procedure he developed Afib, and states he will need a ultrasound for IV insertion. Also requesting for update prep to be sent in the mail. Please advise, thank you.

## 2024-02-28 NOTE — Telephone Encounter (Signed)
 If pt states he needs ultrasound for IV insertion, that would require hospital for his procedure.

## 2024-02-29 ENCOUNTER — Other Ambulatory Visit: Payer: Self-pay

## 2024-02-29 DIAGNOSIS — K7469 Other cirrhosis of liver: Secondary | ICD-10-CM

## 2024-02-29 DIAGNOSIS — Z860100 Personal history of colon polyps, unspecified: Secondary | ICD-10-CM

## 2024-02-29 DIAGNOSIS — I8500 Esophageal varices without bleeding: Secondary | ICD-10-CM

## 2024-02-29 NOTE — Telephone Encounter (Signed)
 Nat, Can you give us  an update on this patients colon/endo that is to be scheduled at the hospital per Dr. San.  Thank you.

## 2024-03-01 ENCOUNTER — Other Ambulatory Visit: Payer: Self-pay

## 2024-03-01 NOTE — Telephone Encounter (Signed)
 Inbound call from Tiffany at [facility] radiology returning voicemail to [employer]. Call back number is [phone]. Please advise, thank you

## 2024-03-04 MED ORDER — NA SULFATE-K SULFATE-MG SULF 17.5-3.13-1.6 GM/177ML PO SOLN: 1.0000 | ORAL | 0 refills | Status: DC

## 2024-03-04 NOTE — Telephone Encounter (Signed)
 Mailed patient instructions for ECL scheduled on 04-09-24 at 12:45 with Dr San per patient request.  Stephane Gander, RN notified of appointment date and time for assistance with IV access.

## 2024-03-05 ENCOUNTER — Telehealth: Payer: Self-pay

## 2024-03-05 NOTE — Telephone Encounter (Signed)
 Copied from CRM [id number]. Topic: Clinical - Medication Question >> Mar 05, 2024 11:33 AM Chasity T wrote: Reason for CRM: Patient is requesting that the [facility] 2 plus perscription be sent to the pharmacy for him to start using due to the Continuous Glucose Sensor (FREESTYLE LIBRE 2 SENSOR) MISC Is being discontinued advised by the pharmacy. Please contact either the patient or pharmacy for additional information if needed.

## 2024-03-06 ENCOUNTER — Other Ambulatory Visit: Payer: Self-pay | Admitting: Internal Medicine

## 2024-03-06 DIAGNOSIS — E119 Type 2 diabetes mellitus without complications: Secondary | ICD-10-CM

## 2024-03-06 MED ORDER — FREESTYLE LIBRE 3 PLUS SENSOR MISC: 1.0000 | 1 refills | Status: DC

## 2024-03-06 NOTE — Telephone Encounter (Signed)
 Please change this sensor for him ?

## 2024-03-11 ENCOUNTER — Encounter: Admitting: Gastroenterology

## 2024-03-20 ENCOUNTER — Other Ambulatory Visit: Payer: Self-pay | Admitting: Internal Medicine

## 2024-03-20 DIAGNOSIS — E785 Hyperlipidemia, unspecified: Secondary | ICD-10-CM

## 2024-03-25 ENCOUNTER — Other Ambulatory Visit: Payer: Self-pay | Admitting: Internal Medicine

## 2024-03-25 DIAGNOSIS — I10 Essential (primary) hypertension: Secondary | ICD-10-CM

## 2024-03-27 ENCOUNTER — Telehealth: Payer: Self-pay | Admitting: Internal Medicine

## 2024-03-27 NOTE — Telephone Encounter (Signed)
 Copied from CRM [id number]. Topic: Clinical - Medication Refill >> Mar 27, 2024  3:34 PM Precious C wrote: Medication: carvedilol  (COREG ) 6.25 MG tablet  Has the patient contacted their pharmacy? Yes (Agent: If no, request that the patient contact the pharmacy for the refill. If patient does not wish to contact the pharmacy document the reason why and proceed with request.) (Agent: If yes, when and what did the pharmacy advise?)  This is the patient's preferred pharmacy:  [facility] [address] [place] - [address] [place] [address] Phone: [phone] Fax: [phone]  Is this the correct pharmacy for this prescription? Yes If no, delete pharmacy and type the correct one.   Has the prescription been filled recently? No  Is the patient out of the medication? Yes  Has the patient been seen for an appointment in the last year OR does the patient have an upcoming appointment? Yes  Can we respond through MyChart? No  Agent: Please be advised that Rx refills may take up to 3 business days. We ask that you follow-up with your pharmacy.

## 2024-03-27 NOTE — Telephone Encounter (Signed)
 Duplicate request, see refill encounter 03/25/24 for carvedilol . Request is within 3 business days reply time.  Interface, Surescripts Out to [facility] Rx Refill  SI    03/27/24 10:30 AM Pharmacy requested follow up on 03/27/2024.   03/25/24 10:27 AM Interface, Surescripts Out routed this conversation to [facility] Refill

## 2024-03-28 ENCOUNTER — Encounter: Admitting: Gastroenterology

## 2024-03-29 DIAGNOSIS — M1990 Unspecified osteoarthritis, unspecified site: Secondary | ICD-10-CM | POA: Diagnosis not present

## 2024-03-29 DIAGNOSIS — M5136 Other intervertebral disc degeneration, lumbar region with discogenic back pain only: Secondary | ICD-10-CM | POA: Diagnosis not present

## 2024-03-29 DIAGNOSIS — Z79891 Long term (current) use of opiate analgesic: Secondary | ICD-10-CM | POA: Diagnosis not present

## 2024-03-29 DIAGNOSIS — G894 Chronic pain syndrome: Secondary | ICD-10-CM | POA: Diagnosis not present

## 2024-03-29 DIAGNOSIS — M5135 Other intervertebral disc degeneration, thoracolumbar region: Secondary | ICD-10-CM | POA: Diagnosis not present

## 2024-04-02 ENCOUNTER — Encounter: Payer: Self-pay | Admitting: Internal Medicine

## 2024-04-02 ENCOUNTER — Ambulatory Visit: Admitting: Internal Medicine

## 2024-04-02 ENCOUNTER — Ambulatory Visit: Payer: Self-pay | Admitting: Internal Medicine

## 2024-04-02 VITALS — BP 138/78 | HR 64 | Temp 98.2°F | Resp 16 | Ht 66.0 in | Wt 232.2 lb

## 2024-04-02 DIAGNOSIS — Z23 Encounter for immunization: Secondary | ICD-10-CM

## 2024-04-02 DIAGNOSIS — I10 Essential (primary) hypertension: Secondary | ICD-10-CM | POA: Diagnosis not present

## 2024-04-02 DIAGNOSIS — E119 Type 2 diabetes mellitus without complications: Secondary | ICD-10-CM | POA: Diagnosis not present

## 2024-04-02 DIAGNOSIS — E519 Thiamine deficiency, unspecified: Secondary | ICD-10-CM

## 2024-04-02 DIAGNOSIS — Z794 Long term (current) use of insulin: Secondary | ICD-10-CM | POA: Diagnosis not present

## 2024-04-02 DIAGNOSIS — D538 Other specified nutritional anemias: Secondary | ICD-10-CM | POA: Diagnosis not present

## 2024-04-02 LAB — BASIC METABOLIC PANEL WITH GFR
BUN: 12 mg/dL (ref 6–23)
CO2: 30 meq/L (ref 19–32)
Calcium: 9.6 mg/dL (ref 8.4–10.5)
Chloride: 104 meq/L (ref 96–112)
Creatinine, Ser: 0.77 mg/dL (ref 0.40–1.50)
GFR: 90.32 mL/min (ref >60.00)
Glucose, Bld: 89 mg/dL (ref 70–99)
Potassium: 4 meq/L (ref 3.5–5.1)
Sodium: 140 meq/L (ref 135–145)

## 2024-04-02 LAB — CBC WITH DIFFERENTIAL/PLATELET
Basophils Absolute: 0 K/uL (ref 0.0–0.1)
Basophils Relative: 0.8 % (ref 0.0–3.0)
Eosinophils Absolute: 0.5 K/uL (ref 0.0–0.7)
Eosinophils Relative: 8.3 % — ABNORMAL HIGH (ref 0.0–5.0)
HCT: 36.4 % — ABNORMAL LOW (ref 39.0–52.0)
Hemoglobin: 12.1 g/dL — ABNORMAL LOW (ref 13.0–17.0)
Lymphocytes Relative: 22.7 % (ref 12.0–46.0)
Lymphs Abs: 1.3 K/uL (ref 0.7–4.0)
MCHC: 33.3 g/dL (ref 30.0–36.0)
MCV: 87.2 fl (ref 78.0–100.0)
Monocytes Absolute: 0.4 K/uL (ref 0.1–1.0)
Monocytes Relative: 6.8 % (ref 3.0–12.0)
Neutro Abs: 3.6 K/uL (ref 1.4–7.7)
Neutrophils Relative %: 61.4 % (ref 43.0–77.0)
Platelets: 223 K/uL (ref 150.0–400.0)
RBC: 4.17 Mil/uL — ABNORMAL LOW (ref 4.22–5.81)
RDW: 16.4 % — ABNORMAL HIGH (ref 11.5–15.5)
WBC: 5.8 K/uL (ref 4.0–10.5)

## 2024-04-02 LAB — HEMOGLOBIN A1C: Hgb A1c MFr Bld: 6.9 % — ABNORMAL HIGH (ref 4.6–6.5)

## 2024-04-02 MED ORDER — THIAMINE HCL 100 MG PO TABS: 100.0000 mg | ORAL_TABLET | Freq: Every day | ORAL | 0 refills | Status: AC

## 2024-04-02 NOTE — Patient Instructions (Signed)
 Wernicke-Korsakoff Syndrome Wernicke-Korsakoff syndrome (WKS) is a brain problem that affects thinking, eye movements, and balance. WKS can cause long-term memory loss. The condition has two phases. Wernicke encephalopathy happens first. If the condition gets worse, Korsakoff syndrome happens next. This condition can permanently damage the areas of the brain that are responsible for memory. Wernicke encephalopathy is a medical emergency. What are the causes? This condition is caused by not getting enough vitamin B1 (thiamine ). This can happen due to: Drinking too much alcohol (alcoholism). This is the most common cause. Not eating enough healthy foods, also called poor nutrition. Certain medical conditions. Surgery on the stomach or intestines. Very bad vomiting in pregnancy. What increases the risk? You may be more likely to get WKS if: You have acquired immunodeficiency syndrome (AIDS). You have long-term infections. You are not eating because of fear of weight gain (anorexia nervosa). You have kidney dialysis. You have advanced cancer. What are the signs or symptoms? Symptoms of this condition may vary based on the stage of the disease. Symptoms of Wernicke encephalopathy include: Problems with muscle coordination. Eye movements that are not normal. Confusion. Losing your mental abilities. Double vision. Alcohol withdrawal symptoms. As the disease gets worse, symptoms of Korsakoff syndrome may develop. These include: Very bad memory loss. Not being able to form new memories. Seeing, hearing, tasting, smelling, or feeling things that are not real. These are called hallucinations. How is this diagnosed? This condition is diagnosed based on your symptoms and medical history. Your health care provider may suspect this condition if you drink too much alcohol. You may also have tests, such as: A physical exam. Blood tests to check your thiamine  level and to look for other signs that  you aren't getting enough healthy foods Imaging tests to look for changes in the brain and other parts of the body. These may include MRI and CT scans. How is this treated? Treatment for this condition needs to start early. If the condition is diagnosed and treated in the early stages, it can be reversed. If the condition is left untreated, it can cause permanent brain damage. Treatment may include: Thiamine  replacement therapy. You may be given thiamine  through an IV or by mouth. Treatment for alcoholism. Treatment for poor nutrition. Medicine. Mental health counseling. Follow these instructions at home: Lifestyle  Do not drink alcohol. Get help if you drink too much alcohol. Eat a healthy diet. Eat plenty of vegetables, fruits, low-fat dairy products, whole grains, and lean protein. Limit foods that are high in fats, added sugars, or salt. Get regular exercise. Most adults should exercise for at least 150 minutes each week. The exercise should increase your heart rate and make you sweat. Most adults should also do strengthening exercises at least twice a week. General instructions Take over-the-counter and prescription medicines only as told by your provider. Get support from a caregiver if you need it. Keep all follow-up visits. You may need to see a mental health counselor or go to a dietitian to talk about healthy eating. Where to find more information [employer] of Neurological Disorders and Stroke: [employer] for Rare Disorders: rarediseases.org Contact a health care provider if: You are confused or have memory problems. You drink too much alcohol and can't stop. Get help right away if: You feel like you may hurt yourself or others. You have thoughts about taking your own life. Get help right away if you feel like you may hurt yourself or others, or have thoughts about taking  your own life. Go to your nearest emergency room or: Call 911. Call  the National Suicide Prevention Lifeline at 305 315 6427 or 988. This is open 24 hours a day. Text the Crisis Text Line at (215)209-3062. This information is not intended to replace advice given to you by your health care provider. Make sure you discuss any questions you have with your health care provider. Document Revised: 10/12/2022 Document Reviewed: 10/12/2022 Elsevier Patient Education  2024 [employer].

## 2024-04-02 NOTE — Progress Notes (Unsigned)
 Subjective:  Patient ID: Bradley Hull, male    DOB: 07-09-1951  Age: 73 y.o. MRN: [id number]  CC: Addiction Problem (Patient wants to discuss possible treatment for opioid addiction. ), Anemia, Hypertension, and Diabetes   HPI Bradley Hull presents for f/up ----    Discussed the use of AI scribe software for clinical note transcription with the patient, who gave verbal consent to proceed.  History of Present Illness Bradley Hull is a 73 year old male who presents for management of hydrocodone  use.  He has an addiction to hydrocodone , taking 10 to 12 pills a day, which exceeds the prescribed amount. He obtains these from pain management for two knee replacements and upper and lower back issues. He states he likes the way they make him feel but experiences constipation as a side effect. No suicidal thoughts.  He experiences slight chest pain and shortness of breath, describing it as minor pain sometimes radiating to the shoulder. He recalls being told he has a hole in his heart but is unsure about the details.  He has a history of frequent urination and was treated for overactive bladder, which was supposed to be addressed by a recent surgery. He reports having bladder spasms prior to the surgery.  He notes a testicular issue following surgery a month and a half ago, where one testicle became twice its normal size, but it is now reducing in size. He has a follow-up with urology next month.  He has been told he is anemic but does not know the cause. He does not take vitamin supplements and recalls a family history of anemia. He is scheduled for an upper and lower endoscopy on the 16th to investigate further.  He has a history of diabetes and other unspecified health issues, which he believes complicate his ability to receive treatment for his addiction.     Outpatient Medications Prior to Visit  Medication Sig Dispense Refill   aspirin  EC 81 MG tablet Take 81 mg by mouth daily.  Swallow whole.     atorvastatin  (LIPITOR) 80 MG tablet TAKE 1 TABLET BY MOUTH AT BEDTIME 90 tablet 0   carvedilol  (COREG ) 6.25 MG tablet TAKE 1 TABLET BY MOUTH TWICE DAILY WITH A MEAL 180 tablet 0   Continuous Glucose Sensor (FREESTYLE LIBRE 3 PLUS SENSOR) MISC Apply 1 Act topically every 14 (fourteen) days. Change sensor every 15 days. 6 each 1   Glucagon  (GVOKE HYPOPEN  2-PACK) 1 MG/0.2ML SOAJ Inject 1 Act into the skin daily as needed. 2 mL 5   HYDROcodone -acetaminophen  (NORCO) 7.5-325 MG tablet Take 1 tablet by mouth every 6 (six) hours as needed for moderate pain (pain score 4-6) or severe pain (pain score 7-10) (post-operatively). 30 tablet 0   Insulin  Glargine-Lixisenatide  (SOLIQUA ) 100-33 UNT-MCG/ML SOPN Inject 20 Units into the skin daily. 18 mL 0   amLODipine  (NORVASC ) 10 MG tablet Take 1 tablet by mouth once daily 90 tablet 0   Na Sulfate-K Sulfate-Mg Sulfate concentrate (SUPREP BOWEL PREP KIT) 17.5-3.13-1.6 GM/177ML SOLN Take 1 kit (354 mLs total) by mouth as directed. 324 mL 0   Continuous Glucose Receiver (FREESTYLE LIBRE 2 READER) DEVI Use as directed to check blood sugars 2 each 5   furosemide  (LASIX ) 20 MG tablet Take 1 tablet (20 mg total) by mouth daily. 90 tablet 1   No facility-administered medications prior to visit.    ROS Review of Systems  Constitutional:  Positive for fatigue. Negative for appetite change, chills, diaphoresis and  fever.  HENT: Negative.  Negative for trouble swallowing.   Eyes:  Negative for visual disturbance.  Respiratory:  Positive for shortness of breath. Negative for cough, chest tightness and wheezing.   Cardiovascular:  Positive for chest pain. Negative for palpitations and leg swelling.  Gastrointestinal: Negative.  Negative for abdominal pain, blood in stool, constipation, diarrhea, nausea and vomiting.  Endocrine: Positive for polyuria.  Genitourinary:  Positive for frequency. Negative for difficulty urinating, dysuria, penile swelling,  scrotal swelling and testicular pain.  Musculoskeletal:  Positive for arthralgias, back pain and neck pain. Negative for myalgias.  Skin: Negative.   Neurological: Negative.  Negative for dizziness, weakness, light-headedness and headaches.  Hematological:  Negative for adenopathy. Does not bruise/bleed easily.  Psychiatric/Behavioral:  Positive for confusion and decreased concentration. Negative for dysphoric mood and sleep disturbance. The patient is not nervous/anxious.     Objective:  BP 138/78 (BP Location: Left Arm, Patient Position: Sitting, Cuff Size: Normal)   Pulse 64   Temp 98.2 F (36.8 C) (Oral)   Resp 16   Ht 5' 6 (1.676 m)   Wt 232 lb 3.2 oz (105.3 kg)   SpO2 96%   BMI 37.48 kg/m   BP Readings from Last 3 Encounters:  04/02/24 138/78  01/20/24 139/75  01/17/24 (!) 142/82    Wt Readings from Last 3 Encounters:  04/02/24 232 lb 3.2 oz (105.3 kg)  02/14/24 234 lb (106.1 kg)  01/19/24 234 lb 6.4 oz (106.3 kg)    Physical Exam Vitals reviewed.  Constitutional:      General: He is not in acute distress.    Appearance: He is not toxic-appearing or diaphoretic.  HENT:     Nose: Nose normal.     Mouth/Throat:     Mouth: Mucous membranes are moist.  Eyes:     General: No scleral icterus.    Conjunctiva/sclera: Conjunctivae normal.  Cardiovascular:     Rate and Rhythm: Normal rate and regular rhythm.     Heart sounds: No murmur heard.    No friction rub. No gallop.  Pulmonary:     Effort: Pulmonary effort is normal.     Breath sounds: No stridor. No wheezing or rales.  Abdominal:     General: Abdomen is protuberant. Bowel sounds are normal. There is no distension.     Palpations: Abdomen is soft. There is no hepatomegaly, splenomegaly or mass.     Tenderness: There is no abdominal tenderness. There is no guarding.  Musculoskeletal:        General: Normal range of motion.     Cervical back: Neck supple.     Right lower leg: No edema.     Left lower leg:  No edema.  Lymphadenopathy:     Cervical: No cervical adenopathy.  Skin:    General: Skin is warm and dry.  Neurological:     General: No focal deficit present.     Mental Status: He is alert.  Psychiatric:        Mood and Affect: Mood normal.        Behavior: Behavior normal.     Lab Results  Component Value Date   WBC 5.8 04/02/2024   HGB 12.1 (L) 04/02/2024   HCT 36.4 (L) 04/02/2024   PLT 223.0 04/02/2024   GLUCOSE 89 04/02/2024   CHOL 70 11/16/2023   TRIG 85.0 11/16/2023   HDL 36.70 (L) 11/16/2023   LDLCALC 16 11/16/2023   ALT 17 01/12/2024   AST 16 01/12/2024  NA 140 04/02/2024   K 4.0 04/02/2024   CL 104 04/02/2024   CREATININE 0.77 04/02/2024   BUN 12 04/02/2024   CO2 30 04/02/2024   TSH 0.67 11/16/2023   PSA 0.1 06/26/2023   INR 1.1 (H) 11/16/2023   HGBA1C 6.9 (H) 04/02/2024   MICROALBUR 60.4 (H) 02/16/2024    US  Abdomen Limited RUQ (LIVER/GB) Result Date: 02/13/2024 CLINICAL DATA:  Cirrhosis EXAM: ULTRASOUND ABDOMEN LIMITED RIGHT UPPER QUADRANT COMPARISON:  X-ray 08/17/2023 FINDINGS: Gallbladder: No gallstones or wall thickening visualized. No sonographic Murphy sign noted by sonographer. Common bile duct: Diameter: 6 mm Liver: Nodular heterogeneous liver consistent with known chronic liver disease. Portal vein is patent on color Doppler imaging with normal direction of blood flow towards the liver. Other: 3.8 cm anechoic structure in the right kidney consistent with the cyst. This has benign features. This was seen on the prior examinations as well. IMPRESSION: Nodular heterogeneous liver.  No obvious mass by limited ultrasound. No gallstones or ductal dilatation. Electronically Signed   By: Ranell Bring M.D.   On: 02/13/2024 12:45    Assessment & Plan:  Need for immunization against influenza  Anemia due to acquired thiamine  deficiency -     Thiamine  HCl; Take 1 tablet (100 mg total) by mouth daily.  Dispense: 90 tablet; Refill: 0 -     CBC with  Differential/Platelet; Future  Essential hypertension- BP is well controlled. -     Basic metabolic panel with GFR; Future  Insulin -requiring or dependent type II diabetes mellitus (HCC)- Blood sugar is well controlled. -     Basic metabolic panel with GFR; Future -     Hemoglobin A1c; Future     Follow-up: Return in about 3 months (around 07/02/2024).  Debby Molt, MD

## 2024-04-03 ENCOUNTER — Encounter (HOSPITAL_COMMUNITY): Payer: Self-pay | Admitting: Gastroenterology

## 2024-04-03 MED ORDER — AMLODIPINE BESYLATE 10 MG PO TABS: 10.0000 mg | ORAL_TABLET | Freq: Every day | ORAL | 0 refills | Status: DC

## 2024-04-03 NOTE — Progress Notes (Signed)
 Attempted to obtain medical history for pre op call via telephone, unable to reach at this time. HIPAA compliant voicemail message left requesting return call to pre surgical testing department.

## 2024-04-03 NOTE — Progress Notes (Signed)
 Pre op call Bradley Hull   PCP-Jones MD  Cardiologist-Patwardhan  Pulmonologist- n/a  EKG-11/21/23 Echo-01/04/24 Cath-n/a Stress-n/a ICD/PM- n/a GLP1-n/a Blood Thinner-n/a  History: HTN,Stroke, Hep C., Prostate Cancer, Cirrhosis, DM. Saw his cardiologist last 12/21/23 where they did a clearance for a prostate surgery which was done in August. In his chart says during endo procedure his BP dropped and HR dropped, he endorses that last time he got this done in the hospital he went into afib and they observed him since it was new and eventually went back to normal. He doesn't take blood thinners just aspirin , said his last procedure in Aug went fine no issues, they do have problems getting his IV and have used US  in the past. No euipment, says no issues since last procedure in August. Anesthesia Review- Yes-okay to proceed

## 2024-04-04 ENCOUNTER — Telehealth: Payer: Self-pay

## 2024-04-04 NOTE — Telephone Encounter (Signed)
 Procedure:COLON Procedure date: 04/09/24 Procedure location: [facility] Arrival Time: 11:30 Spoke with the patient Y/N: Y Any prep concerns? N  Has the patient obtained the prep from the pharmacy ? Y Do you have a care partner and transportation: Y Any additional concerns? N

## 2024-04-09 ENCOUNTER — Encounter (HOSPITAL_COMMUNITY)
Admission: RE | Disposition: A | Discharge status: Home / Self Care | Payer: Self-pay | Source: Home / Self Care | Attending: Gastroenterology

## 2024-04-09 ENCOUNTER — Ambulatory Visit (HOSPITAL_COMMUNITY): Admitting: Anesthesiology

## 2024-04-09 ENCOUNTER — Ambulatory Visit (HOSPITAL_BASED_OUTPATIENT_CLINIC_OR_DEPARTMENT_OTHER): Admitting: Anesthesiology

## 2024-04-09 ENCOUNTER — Other Ambulatory Visit: Payer: Self-pay

## 2024-04-09 ENCOUNTER — Encounter (HOSPITAL_COMMUNITY): Payer: Self-pay | Admitting: Gastroenterology

## 2024-04-09 ENCOUNTER — Ambulatory Visit (HOSPITAL_COMMUNITY)
Admission: RE | Admit: 2024-04-09 | Discharge: 2024-04-09 | Disposition: A | Discharge status: Home / Self Care | Attending: Gastroenterology | Admitting: Gastroenterology

## 2024-04-09 DIAGNOSIS — I10 Essential (primary) hypertension: Secondary | ICD-10-CM | POA: Insufficient documentation

## 2024-04-09 DIAGNOSIS — Z7984 Long term (current) use of oral hypoglycemic drugs: Secondary | ICD-10-CM | POA: Diagnosis not present

## 2024-04-09 DIAGNOSIS — K2990 Gastroduodenitis, unspecified, without bleeding: Secondary | ICD-10-CM

## 2024-04-09 DIAGNOSIS — K5730 Diverticulosis of large intestine without perforation or abscess without bleeding: Secondary | ICD-10-CM

## 2024-04-09 DIAGNOSIS — Z794 Long term (current) use of insulin: Secondary | ICD-10-CM | POA: Insufficient documentation

## 2024-04-09 DIAGNOSIS — K2980 Duodenitis without bleeding: Secondary | ICD-10-CM | POA: Diagnosis not present

## 2024-04-09 DIAGNOSIS — Z79899 Other long term (current) drug therapy: Secondary | ICD-10-CM | POA: Diagnosis not present

## 2024-04-09 DIAGNOSIS — Z860100 Personal history of colon polyps, unspecified: Secondary | ICD-10-CM

## 2024-04-09 DIAGNOSIS — Z7982 Long term (current) use of aspirin: Secondary | ICD-10-CM | POA: Diagnosis not present

## 2024-04-09 DIAGNOSIS — Z8673 Personal history of transient ischemic attack (TIA), and cerebral infarction without residual deficits: Secondary | ICD-10-CM | POA: Insufficient documentation

## 2024-04-09 DIAGNOSIS — E785 Hyperlipidemia, unspecified: Secondary | ICD-10-CM | POA: Diagnosis not present

## 2024-04-09 DIAGNOSIS — Z6838 Body mass index (BMI) 38.0-38.9, adult: Secondary | ICD-10-CM | POA: Diagnosis not present

## 2024-04-09 DIAGNOSIS — K648 Other hemorrhoids: Secondary | ICD-10-CM | POA: Diagnosis not present

## 2024-04-09 DIAGNOSIS — F17210 Nicotine dependence, cigarettes, uncomplicated: Secondary | ICD-10-CM | POA: Insufficient documentation

## 2024-04-09 DIAGNOSIS — K7469 Other cirrhosis of liver: Secondary | ICD-10-CM

## 2024-04-09 DIAGNOSIS — Z1211 Encounter for screening for malignant neoplasm of colon: Secondary | ICD-10-CM | POA: Insufficient documentation

## 2024-04-09 DIAGNOSIS — E119 Type 2 diabetes mellitus without complications: Secondary | ICD-10-CM | POA: Insufficient documentation

## 2024-04-09 DIAGNOSIS — Z833 Family history of diabetes mellitus: Secondary | ICD-10-CM | POA: Diagnosis not present

## 2024-04-09 DIAGNOSIS — K641 Second degree hemorrhoids: Secondary | ICD-10-CM

## 2024-04-09 DIAGNOSIS — K2970 Gastritis, unspecified, without bleeding: Secondary | ICD-10-CM | POA: Diagnosis not present

## 2024-04-09 DIAGNOSIS — I4891 Unspecified atrial fibrillation: Secondary | ICD-10-CM | POA: Diagnosis not present

## 2024-04-09 DIAGNOSIS — E669 Obesity, unspecified: Secondary | ICD-10-CM | POA: Diagnosis not present

## 2024-04-09 DIAGNOSIS — K7460 Unspecified cirrhosis of liver: Secondary | ICD-10-CM | POA: Insufficient documentation

## 2024-04-09 DIAGNOSIS — Z860101 Personal history of adenomatous and serrated colon polyps: Secondary | ICD-10-CM | POA: Diagnosis not present

## 2024-04-09 DIAGNOSIS — I8500 Esophageal varices without bleeding: Secondary | ICD-10-CM

## 2024-04-09 HISTORY — PX: ESOPHAGOGASTRODUODENOSCOPY: SHX5428

## 2024-04-09 HISTORY — PX: COLONOSCOPY: SHX5424

## 2024-04-09 LAB — GLUCOSE, CAPILLARY: Glucose-Capillary: 103 mg/dL — ABNORMAL HIGH (ref 70–99)

## 2024-04-09 SURGERY — COLONOSCOPY
Anesthesia: Monitor Anesthesia Care

## 2024-04-09 MED ORDER — PROPOFOL 1000 MG/100ML IV EMUL
INTRAVENOUS | Status: AC
  Filled 2024-04-09: qty 100

## 2024-04-09 MED ORDER — LIDOCAINE HCL (PF) 2 % IJ SOLN
INTRAMUSCULAR | Status: DC | PRN
Start: 2024-04-09 — End: 2024-04-09
  Administered 2024-04-09: 40 mg via INTRADERMAL

## 2024-04-09 MED ORDER — SODIUM CHLORIDE 0.9 % IV SOLN: INTRAVENOUS | Status: DC

## 2024-04-09 MED ORDER — SODIUM CHLORIDE 0.9 % IV SOLN
INTRAVENOUS | Status: DC | PRN
Start: 2024-04-09 — End: 2024-04-09

## 2024-04-09 MED ORDER — GLYCOPYRROLATE PF 0.2 MG/ML IJ SOSY
PREFILLED_SYRINGE | INTRAMUSCULAR | Status: DC | PRN
Start: 2024-04-09 — End: 2024-04-09
  Administered 2024-04-09 (×2): .1 mg via INTRAVENOUS

## 2024-04-09 MED ORDER — EPHEDRINE SULFATE-NACL 50-0.9 MG/10ML-% IV SOSY
PREFILLED_SYRINGE | INTRAVENOUS | Status: DC | PRN
  Administered 2024-04-09 (×2): 5 mg via INTRAVENOUS

## 2024-04-09 MED ORDER — PROPOFOL 500 MG/50ML IV EMUL
INTRAVENOUS | Status: AC
  Filled 2024-04-09: qty 50

## 2024-04-09 MED ORDER — PHENYLEPHRINE HCL (PRESSORS) 10 MG/ML IV SOLN
INTRAVENOUS | Status: DC | PRN
  Administered 2024-04-09: 40 ug via INTRAVENOUS
  Administered 2024-04-09: 80 ug via INTRAVENOUS

## 2024-04-09 MED ORDER — PROPOFOL 500 MG/50ML IV EMUL
INTRAVENOUS | Status: DC | PRN
Start: 2024-04-09 — End: 2024-04-09
  Administered 2024-04-09: 20 mg via INTRAVENOUS
  Administered 2024-04-09: 150 ug/kg/min via INTRAVENOUS
  Administered 2024-04-09 (×2): 20 mg via INTRAVENOUS

## 2024-04-09 NOTE — Interval H&P Note (Signed)
 History and Physical Interval Note:  04/09/2024 1:08 PM  Bradley Hull  has presented today for surgery, with the diagnosis of Cirrhosis, hx of varices, hx of colon polyps,.  The various methods of treatment have been discussed with the patient and family. After consideration of risks, benefits and other options for treatment, the patient has consented to  Procedure(s): COLONOSCOPY (N/A) EGD (ESOPHAGOGASTRODUODENOSCOPY) (N/A) as a surgical intervention.  The patient's history has been reviewed, patient examined, no change in status, stable for surgery.  I have reviewed the patient's chart and labs.  Questions were answered to the patient's satisfaction.     [name]

## 2024-04-09 NOTE — Transfer of Care (Signed)
 Immediate Anesthesia Transfer of Care Note  Patient: Bradley Hull  Procedure(s) Performed: Procedure(s): COLONOSCOPY (N/A) EGD (ESOPHAGOGASTRODUODENOSCOPY) (N/A)  Patient Location: PACU  Anesthesia Type:MAC  Level of Consciousness: Patient easily awoken, comfortable, cooperative, following commands, responds to stimulation.   Airway & Oxygen Therapy: Patient spontaneously breathing, ventilating well, oxygen via simple oxygen mask.  Post-op Assessment: Report given to PACU RN, vital signs reviewed and stable, moving all extremities.   Post vital signs: Reviewed and stable.  Complications: No apparent anesthesia complications Last Vitals:  Vitals Value Taken Time  BP 95/39 04/09/24 14:05  Temp    Pulse 59 04/09/24 14:07  Resp 23 04/09/24 14:07  SpO2 97 % 04/09/24 14:07  Vitals shown include unfiled device data.  Last Pain:  Vitals:   04/09/24 1403  TempSrc:   PainSc: 0-No pain         Complications: No notable events documented.

## 2024-04-09 NOTE — Anesthesia Postprocedure Evaluation (Signed)
 Anesthesia Post Note  Patient: Bradley Hull  Procedure(s) Performed: COLONOSCOPY EGD (ESOPHAGOGASTRODUODENOSCOPY)     Patient location during evaluation: PACU Anesthesia Type: MAC Level of consciousness: awake and alert Pain management: pain level controlled Vital Signs Assessment: post-procedure vital signs reviewed and stable Respiratory status: spontaneous breathing, nonlabored ventilation and respiratory function stable Cardiovascular status: blood pressure returned to baseline and stable Postop Assessment: no apparent nausea or vomiting Anesthetic complications: no   No notable events documented.  Last Vitals:  Vitals:   04/09/24 1420 04/09/24 1430  BP: 121/66 (!) 140/66  Pulse: 60 (!) 59  Resp: (!) 22 19  Temp:    SpO2: 95% 98%    Last Pain:  Vitals:   04/09/24 1430  TempSrc:   PainSc: 0-No pain                 [name]

## 2024-04-09 NOTE — H&P (Signed)
 GASTROENTEROLOGY PROCEDURE H&P NOTE   Primary Care Physician: Joshua Debby CROME, MD    Reason for Procedure:  Esophageal varices screening, colon polyp surveillance  Plan:    EGD, colonoscopy   Patient is appropriate for endoscopic procedure(s) at [facility].  The nature of the procedure, as well as the risks, benefits, and alternatives were carefully and thoroughly reviewed with the patient. Ample time for discussion and questions allowed. The patient understood, was satisfied, and agreed to proceed.     HPI: Bradley Hull is a 73 y.o. male who presents for EGD for ongoing esophageal varices screening along with colonoscopy for continued polyp surveillance.  Last EGD was 08/2020 and notable for moderate gastritis but no esophageal varices.  Continues to follow with [facility] for his cirrhosis.  Currently prescribed carvedilol  and Lasix .  Last colonoscopy was 08/2020 and notable for 6 small 2-5 mm adenomatous polyps, sigmoid diverticulosis, internal hemorrhoids.  Past Medical History:  Diagnosis Date   A-fib [facility])    Arthritis    Benign prostatic hyperplasia    Cirrhosis (HCC)    Colon polyps    Complication of anesthesia    bp droppped and heart rate sped up after 09-09-2020 endoscopy /colonscopy   DM type 2 (diabetes mellitus, type 2) (HCC)    Hepatitis C    took treatment for 2018 issue resolved per pt   History of kidney stones    Hypertension    Nicotine  addiction    Obesity    Osteoarthritis of both knees    Prostate cancer (HCC)    Renal lithiasis    Stroke [facility])     Past Surgical History:  Procedure Laterality Date   COLONOSCOPY  09/09/2020   [facility]   CYSTOSCOPY  11/17/2020   Procedure: CYSTOSCOPY FLEXIBLE;  Surgeon: Watt Rush, MD;  Location: [facility];  Service: Urology;;   CYSTOSCOPY/URETEROSCOPY/HOLMIUM LASER/STENT PLACEMENT Right 07/08/2022   Procedure: CYSTOSCOPY RIGHT RETROGRADE PYELOGRAM  /URETEROSCOPY/HOLMIUM LASER/STENT PLACEMENT;  Surgeon: Watt Rush, MD;  Location: [facility];  Service: Urology;  Laterality: Right;  1 HR   ESOPHAGOGASTRODUODENOSCOPY  2016   KIDNEY STONE SURGERY Right yrs ago   LITHOTRIPSY Left 1986   MULTIPLE EXTRACTIONS WITH ALVEOLOPLASTY N/A 09/09/2016   Procedure: MULTIPLE EXTRACTION WITH ALVEOLOPLASTY - one, two, six, seven, nine, ten, eleven, sixteen, seventeen, twenty, twenty-one, twenty-two, twenty-four, twenty-five, twenty-six, twenty-seven, twenty-eight, twenty-nine, thirty-one, thirty-two; alveolplasty; removal bilateral mandible turi; removal cyst right mandible;  bone graft;  Surgeon: Glendia Primrose, DDS;  Location: [facility];  Service: Oral S   PILONIDAL CYST EXCISION  yrs ago   from spine   SPACE OAR INSTILLATION N/A 11/17/2020   Procedure: SPACE OAR INSTILLATION FIDUCIAL MARKER;  Surgeon: Watt Rush, MD;  Location: [facility];  Service: Urology;  Laterality: N/A;  prostate   TOTAL KNEE ARTHROPLASTY Bilateral 03/2020   TRANSURETHRAL RESECTION OF PROSTATE N/A 01/19/2024   Procedure: TURP (TRANSURETHRAL RESECTION OF PROSTATE);  Surgeon: Watt Rush, MD;  Location: [facility];  Service: Urology;  Laterality: N/A;   UPPER GASTROINTESTINAL ENDOSCOPY  08/30/2020   [facility]    WISDOM TOOTH EXTRACTION     x4 removed    Prior to Admission medications   Medication Sig Start Date End Date Taking? Authorizing Provider  amLODipine  (NORVASC ) 10 MG tablet Take 1 tablet (10 mg total) by mouth daily. 04/03/24   Joshua Debby CROME, MD  aspirin  EC 81 MG tablet Take 81 mg by mouth daily.  Swallow whole.    [provider]  atorvastatin  (LIPITOR) 80 MG tablet TAKE 1 TABLET BY MOUTH AT BEDTIME 03/21/24   Joshua Debby CROME, MD  carvedilol  (COREG ) 6.25 MG tablet TAKE 1 TABLET BY MOUTH TWICE DAILY WITH A MEAL 03/29/24   Joshua Debby CROME, MD  Continuous Glucose Sensor (FREESTYLE LIBRE 3 PLUS SENSOR) MISC Apply 1 Act topically every 14 (fourteen) days. Change sensor  every 15 days. 03/06/24   Joshua Debby CROME, MD  Glucagon  (GVOKE HYPOPEN  2-PACK) 1 MG/0.2ML SOAJ Inject 1 Act into the skin daily as needed. 01/17/24   Joshua Debby CROME, MD  HYDROcodone -acetaminophen  (NORCO) 7.5-325 MG tablet Take 1 tablet by mouth every 6 (six) hours as needed for moderate pain (pain score 4-6) or severe pain (pain score 7-10) (post-operatively). 01/20/24   Alvaro Ricardo KATHEE Mickey., MD  Insulin  Glargine-Lixisenatide  (SOLIQUA ) 100-33 UNT-MCG/ML SOPN Inject 20 Units into the skin daily. 11/17/23   Joshua Debby CROME, MD  thiamine  (VITAMIN B1) 100 MG tablet Take 1 tablet (100 mg total) by mouth daily. 04/02/24   Joshua Debby CROME, MD    No current facility-administered medications for this encounter.    Allergies as of 03/01/2024 - Review Complete 02/14/2024  Allergen Reaction Noted   Dilaudid  [hydromorphone  hcl] Itching 09/05/2016   Hydromorphone  Itching and Other (See Comments) 03/17/2020    Family History  Problem Relation Age of Onset   Lung cancer Mother        mets   Diabetes Mother    Breast cancer Mother    Diabetes Father    Hyperlipidemia Father    Colon cancer Father    Diabetes Sister    Hepatitis Brother    Schizophrenia Brother    Diabetes Sister    Diabetes Sister    Diabetes Brother    Diabetes Brother    Diabetes Maternal Grandmother    Esophageal cancer Neg Hx    Rectal cancer Neg Hx    Stomach cancer Neg Hx     Social History   Socioeconomic History   Marital status: Single    Spouse name: Not on file   Number of children: 2   Years of education: Not on file   Highest education level: Not on file  Occupational History   Occupation: retired  Tobacco Use   Smoking status: Every Day    Current packs/day: 0.25    Average packs/day: 0.5 packs/day for 51.7 years (25.7 ttl pk-yrs)    Types: Cigarettes    Start date: 2025    Passive exposure: Current   Smokeless tobacco: Never   Tobacco comments:    Pt smokes 1 pack every other day  Vaping Use    Vaping status: Never Used  Substance and Sexual Activity   Alcohol use: Yes    Alcohol/week: 3.0 standard drinks of alcohol    Types: 3 Cans of beer per week    Comment: occas   Drug use: Never   Sexual activity: Yes  Other Topics Concern   Not on file  Social History Narrative   Not on file   Social Drivers of Health   Financial Resource Strain: Low Risk  (02/14/2024)   Overall Financial Resource Strain (CARDIA)    Difficulty of Paying Living Expenses: Not hard at all  Food Insecurity: No Food Insecurity (02/14/2024)   Hunger Vital Sign    Worried About Running Out of Food in the Last Year: Never true    Ran Out of Food in the Last  Year: Never true  Transportation Needs: No Transportation Needs (02/14/2024)   PRAPARE - Administrator, Civil Service (Medical): No    Lack of Transportation (Non-Medical): No  Physical Activity: Inactive (02/14/2024)   Exercise Vital Sign    Days of Exercise per Week: 0 days    Minutes of Exercise per Session: 0 min  Stress: Stress Concern Present (02/14/2024)   [employer] of Occupational Health - Occupational Stress Questionnaire    Feeling of Stress: To some extent  Social Connections: Moderately Isolated (02/14/2024)   Social Connection and Isolation Panel    Frequency of Communication with Friends and Family: Three times a week    Frequency of Social Gatherings with Friends and Family: Three times a week    Attends Religious Services: 1 to 4 times per year    Active Member of Clubs or Organizations: No    Attends Banker Meetings: Never    Marital Status: Never married  Intimate Partner Violence: Not At Risk (02/14/2024)   Humiliation, Afraid, Rape, and Kick questionnaire    Fear of Current or Ex-Partner: No    Emotionally Abused: No    Physically Abused: No    Sexually Abused: No    Physical Exam: Vital signs in last 24 hours: @There  were no vitals taken for this visit. GEN: NAD EYE: Sclerae  anicteric ENT: MMM CV: Non-tachycardic Pulm: CTA b/l GI: Soft, NT/ND NEURO:  Alert & Oriented x 3   Sandor Flatter, DO [facility]   04/09/2024 11:34 AM

## 2024-04-09 NOTE — Op Note (Signed)
 [facility] Patient Name: Bradley Hull Procedure Date: 04/09/2024 MRN: [id number] Attending MD: Sandor Flatter , MD, [id number] Date of Birth: [birth year] CSN: [id number] Age: 73 Admit Type: Outpatient Procedure:                Upper GI endoscopy Indications:              Cirrhosis rule out esophageal varices Providers:                Sandor Flatter, MD, Jacquelyn Jaci Pierce, RN,                            Farris Southgate, Technician Referring MD:              Medicines:                Monitored Anesthesia Care Complications:            No immediate complications. Estimated Blood Loss:     Estimated blood loss was minimal. Procedure:                Pre-Anesthesia Assessment:                           - Prior to the procedure, a History and Physical                            was performed, and patient medications and                            allergies were reviewed. The patient's tolerance of                            previous anesthesia was also reviewed. The risks                            and benefits of the procedure and the sedation                            options and risks were discussed with the patient.                            All questions were answered, and informed consent                            was obtained. Prior Anticoagulants: The patient has                            taken no anticoagulant or antiplatelet agents. ASA                            Grade Assessment: III - A patient with severe                            systemic disease. After reviewing the risks and  benefits, the patient was deemed in satisfactory                            condition to undergo the procedure.                           After obtaining informed consent, the endoscope was                            passed under direct vision. Throughout the                            procedure, the patient's blood pressure, pulse, and                             oxygen saturations were monitored continuously. The                            [id number]-[id number] ([id number]) Olympus endoscope was introduced                            through the mouth, and advanced to the third part                            of duodenum. The upper GI endoscopy was                            accomplished without difficulty. The patient                            tolerated the procedure well. Scope In: Scope Out: Findings:      The examined esophagus was normal.      Scattered mild inflammation characterized by congestion (edema) and       erythema was found in the gastric fundus, in the gastric body and in the       gastric antrum. Biopsies were taken with a cold forceps for histology.       Estimated blood loss was minimal.      Localized mild inflammation characterized by congestion (edema) and       erythema was found in the second portion of the duodenum. The mucosa was       edematous and soft, but not overtly pulsatile. Biopsies were taken away       from the edematous portion with a cold forceps for histology. Prolonged       observation in the duodenum revealed no delayed or ongoing bleeding from       the biopsy site. Estimated blood loss was minimal. Impression:               - Normal esophagus.                           - No esophageal or gastric varices noted on this                            study.                           -  Gastritis. Biopsied.                           - Duodenitis. Biopsied. Moderate Sedation:      Not Applicable - Patient had care per Anesthesia. Recommendation:           - Patient has a contact number available for                            emergencies. The signs and symptoms of potential                            delayed complications were discussed with the                            patient. Return to normal activities tomorrow.                            Written discharge instructions were provided to the                             patient.                           - Resume previous diet.                           - Continue present medications.                           - Await pathology results.                           - Depending on biopsy results from the duodenum,                            may consider referral for endoscopic ultrasound                            (EUS) of the duodenum.                           - Colonoscopy today. Procedure Code(s):        --- Professional ---                           [id number], Esophagogastroduodenoscopy, flexible,                            transoral; with biopsy, single or multiple Diagnosis Code(s):        --- Professional ---                           K29.70, Gastritis, unspecified, without bleeding                           K29.80, Duodenitis without bleeding  K74.60, Unspecified cirrhosis of liver CPT copyright 2022 American Medical Association. All rights reserved. The codes documented in this report are preliminary and upon coder review may  be revised to meet current compliance requirements. Sandor Flatter, MD 04/09/2024 2:17:37 PM Number of Addenda: 0

## 2024-04-09 NOTE — Discharge Instructions (Signed)

## 2024-04-09 NOTE — Anesthesia Preprocedure Evaluation (Addendum)
 Anesthesia Evaluation  Patient identified by MRN, date of birth, ID band Patient awake    Reviewed: Allergy & Precautions, NPO status , Patient's Chart, lab work & pertinent test results, reviewed documented beta blocker date and time   History of Anesthesia Complications (+) history of anesthetic complications  Airway Mallampati: I  TM Distance: >3 FB     Dental  (+) Upper Dentures, Lower Dentures   Pulmonary Current Smoker and Patient abstained from smoking.   Pulmonary exam normal breath sounds clear to auscultation       Cardiovascular hypertension, Pt. on medications Normal cardiovascular exam+ dysrhythmias Atrial Fibrillation  Rhythm:Regular Rate:Normal     Neuro/Psych  PSYCHIATRIC DISORDERS       Neuromuscular disease CVA, No Residual Symptoms    GI/Hepatic ,,,(+) Cirrhosis   Esophageal Varices    , Hepatitis -, CHx/o Hep C treated 2018 Hx/o colon polyps   Endo/Other  diabetes, Well Controlled, Type 2, Oral Hypoglycemic Agents  HLD Obesity  Renal/GU Renal diseaseHx/o renal calculi   Hx/o BPH S/P TURP    Musculoskeletal  (+) Arthritis , Osteoarthritis,    Abdominal  (+) + obese  Peds  Hematology  (+) Blood dyscrasia, anemia   Anesthesia Other Findings   Reproductive/Obstetrics                              Anesthesia Physical Anesthesia Plan  ASA: 3  Anesthesia Plan: MAC   Post-op Pain Management: Minimal or no pain anticipated   Induction: Intravenous  PONV Risk Score and Plan: 1 and Propofol  infusion and Treatment may vary due to age or medical condition  Airway Management Planned: Natural Airway, Nasal Cannula and Simple Face Mask  Additional Equipment: None  Intra-op Plan:   Post-operative Plan:   Informed Consent: I have reviewed the patients History and Physical, chart, labs and discussed the procedure including the risks, benefits and alternatives for the  proposed anesthesia with the patient or authorized representative who has indicated his/her understanding and acceptance.       Plan Discussed with: Anesthesiologist and CRNA  Anesthesia Plan Comments:          Anesthesia Quick Evaluation

## 2024-04-09 NOTE — Op Note (Signed)
 [facility] Patient Name: Bradley Hull Procedure Date: 04/09/2024 MRN: [id number] Attending MD: Sandor Flatter , MD, [id number] Date of Birth: September 30, 1950 CSN: [id number] Age: 73 Admit Type: Outpatient Procedure:                Colonoscopy Indications:              Surveillance: Personal history of adenomatous                            polyps on last colonoscopy 3 years ago                           Last colonoscopy was 08/2020 and notable for 6 small                            2-5 mm adenomatous polyps, sigmoid diverticulosis,                            internal hemorrhoids. Providers:                Sandor Flatter, MD, Jacquelyn Jaci Pierce, RN,                            Farris Southgate, Technician Referring MD:              Medicines:                Monitored Anesthesia Care Complications:            No immediate complications. Estimated Blood Loss:     Estimated blood loss: none. Procedure:                Pre-Anesthesia Assessment:                           - Prior to the procedure, a History and Physical                            was performed, and patient medications and                            allergies were reviewed. The patient's tolerance of                            previous anesthesia was also reviewed. The risks                            and benefits of the procedure and the sedation                            options and risks were discussed with the patient.                            All questions were answered, and informed consent                            was obtained. Prior Anticoagulants:  The patient has                            taken no anticoagulant or antiplatelet agents. ASA                            Grade Assessment: III - A patient with severe                            systemic disease. After reviewing the risks and                            benefits, the patient was deemed in satisfactory                            condition  to undergo the procedure.                           After obtaining informed consent, the colonoscope                            was passed under direct vision. Throughout the                            procedure, the patient's blood pressure, pulse, and                            oxygen saturations were monitored continuously. The                            [id number]-[id number] ([id number] Olympus colonoscope was                            introduced through the anus and advanced to the the                            cecum, identified by appendiceal orifice and                            ileocecal valve. The colonoscopy was performed                            without difficulty. The patient tolerated the                            procedure well. The quality of the bowel                            preparation was good. The ileocecal valve,                            appendiceal orifice, and rectum were photographed. Scope In: 1:42:30 PM Scope Out: 1:58:13 PM Scope Withdrawal Time: 0 hours 11 minutes 41 seconds  Total Procedure Duration: 0 hours 15 minutes 43 seconds  Findings:      The perianal and digital rectal examinations were normal.      A few small-mouthed diverticula were found in the sigmoid colon.      The exam was otherwise normal throughout the remainder of the colon.      Non-bleeding internal hemorrhoids were found during retroflexion. The       hemorrhoids were small. Impression:               - Diverticulosis in the sigmoid colon.                           - Non-bleeding internal hemorrhoids.                           - No specimens collected. Moderate Sedation:      Not Applicable - Patient had care per Anesthesia. Recommendation:           - Patient has a contact number available for                            emergencies. The signs and symptoms of potential                            delayed complications were discussed with the                            patient. Return to  normal activities tomorrow.                            Written discharge instructions were provided to the                            patient.                           - Resume previous diet.                           - Continue present medications.                           - Repeat colonoscopy is not recommended due to                            current age (74 years or older) for screening                            purposes.                           - Return to GI clinic PRN. Procedure Code(s):        --- Professional ---                           H9894, Colorectal cancer screening; colonoscopy on  individual at high risk Diagnosis Code(s):        --- Professional ---                           Z86.010, Personal history of colonic polyps                           K64.8, Other hemorrhoids                           K57.30, Diverticulosis of large intestine without                            perforation or abscess without bleeding CPT copyright 2022 American Medical Association. All rights reserved. The codes documented in this report are preliminary and upon coder review may  be revised to meet current compliance requirements. Sandor Flatter, MD 04/09/2024 2:20:43 PM Number of Addenda: 0

## 2024-04-10 ENCOUNTER — Telehealth: Payer: Self-pay | Admitting: *Deleted

## 2024-04-10 ENCOUNTER — Encounter (HOSPITAL_COMMUNITY): Payer: Self-pay | Admitting: Gastroenterology

## 2024-04-10 LAB — SURGICAL PATHOLOGY

## 2024-04-10 NOTE — Progress Notes (Signed)
 Care Guide Pharmacy Note  04/10/2024 Name: Bradley Hull MRN: [id number] DOB: February 25, 1951  Referred By: Joshua Debby CROME, MD Reason for referral: Call Attempt #1 and Complex Care Management (Outreach to schedule referral with pharmacist )   Bradley Hull is a 73 y.o. year old male who is a primary care patient of Joshua Debby CROME, MD.  Bradley Hull was referred to the pharmacist for assistance related to: DMII  Successful contact was made with the patient to discuss pharmacy services including being ready for the pharmacist to call at least 5 minutes before the scheduled appointment time and to have medication bottles and any blood pressure readings ready for review. The patient agreed to meet with the pharmacist via telephone visit on 04/19/2024  Bradley Hull, CMA [place]  [facility], [facility] Guide Direct Dial: [phone]  Fax: [phone] Website: [email or web address]

## 2024-04-16 ENCOUNTER — Other Ambulatory Visit: Payer: Self-pay

## 2024-04-16 ENCOUNTER — Ambulatory Visit: Payer: Self-pay | Admitting: Gastroenterology

## 2024-04-16 DIAGNOSIS — K7469 Other cirrhosis of liver: Secondary | ICD-10-CM

## 2024-04-16 DIAGNOSIS — I8500 Esophageal varices without bleeding: Secondary | ICD-10-CM

## 2024-04-17 DIAGNOSIS — N200 Calculus of kidney: Secondary | ICD-10-CM | POA: Diagnosis not present

## 2024-04-17 DIAGNOSIS — R3912 Poor urinary stream: Secondary | ICD-10-CM | POA: Diagnosis not present

## 2024-04-17 DIAGNOSIS — Z87440 Personal history of urinary (tract) infections: Secondary | ICD-10-CM | POA: Diagnosis not present

## 2024-04-18 ENCOUNTER — Other Ambulatory Visit: Payer: Self-pay

## 2024-04-18 DIAGNOSIS — I8500 Esophageal varices without bleeding: Secondary | ICD-10-CM

## 2024-04-18 DIAGNOSIS — K7469 Other cirrhosis of liver: Secondary | ICD-10-CM

## 2024-04-19 ENCOUNTER — Other Ambulatory Visit: Admitting: Pharmacist

## 2024-04-19 DIAGNOSIS — I10 Essential (primary) hypertension: Secondary | ICD-10-CM

## 2024-04-19 DIAGNOSIS — E119 Type 2 diabetes mellitus without complications: Secondary | ICD-10-CM

## 2024-04-19 DIAGNOSIS — Z794 Long term (current) use of insulin: Secondary | ICD-10-CM

## 2024-04-19 MED ORDER — AMLODIPINE BESYLATE-VALSARTAN 5-160 MG PO TABS
1.0000 | ORAL_TABLET | Freq: Every day | ORAL | 0 refills | Status: AC
Start: 2024-04-19 — End: ?

## 2024-04-19 NOTE — Patient Instructions (Signed)
 It was a pleasure speaking with you today!  STOP amlodipine  10 mg daily. START amlodipine -valsartan  5-160 mg daily  I will get a BP monitor for you when you come to office on 9/30.  Feel free to call with any questions or concerns!  Darrelyn Drum, PharmD, BCPS, CPP Clinical Pharmacist Practitioner [facility] [phone]

## 2024-04-19 NOTE — Progress Notes (Signed)
 04/19/2024 Name: Bradley Hull MRN: [id number] DOB: [birth year]  Chief Complaint  Patient presents with   Diabetes   Hypertension   Medication Management    Bradley Hull is a 73 y.o. year old male who presented for a telephone visit.   They were referred to the pharmacist by their PCP for assistance in managing diabetes and hypertension.    Subjective:  Care Team: Primary Care Provider: Joshua Debby CROME, MD ; Next Scheduled Visit: 04/23/24   Medication Access/Adherence  Current Pharmacy:  [facility] [address] [place] - [address] [place] [address] Phone: [phone] Fax: [phone]   Patient reports affordability concerns with their medications: No  Patient reports access/transportation concerns to their pharmacy: No  Patient reports adherence concerns with their medications:  No     Diabetes:  Current medications: Soliqua  20 units daily Medications tried in the past: metformin , Janumet   Macrovascular and Microvascular Risk Reduction:  Statin? yes (atorvastatin  80mg ); ACEi/ARB? no; no but is indicated Last urinary albumin/creatinine ratio:  Lab Results  Component Value Date   MICRALBCREAT 132.9 (H) 02/16/2024   Last eye exam:  Lab Results  Component Value Date   HMDIABEYEEXA No Retinopathy 09/21/2022   Last foot exam: 04/10/2023 Tobacco Use:  Tobacco Use: High Risk (04/09/2024)   Patient History    Smoking Tobacco Use: Every Day    Smokeless Tobacco Use: Never    Passive Exposure: Current   Hypertension:  Current medications: Amlodipine  10 mg daily, carvedilol  6.25 mg twice daily Medications previously tried: losartan , hydrochlorothiazide , indapamide   Patient does not have a validated, automated, upper arm home BP cuff Current blood pressure readings: Pt has a wrist cuff and reports BP was 180/90s yesterday however he is no confident this is accurate  Patient denies hypotensive s/sx including  dizziness, lightheadedness.  Patient denies hypertensive symptoms including headache, chest pain, shortness of breath  Objective:  Lab Results  Component Value Date   HGBA1C 6.9 (H) 04/02/2024    Lab Results  Component Value Date   CREATININE 0.77 04/02/2024   BUN 12 04/02/2024   NA 140 04/02/2024   K 4.0 04/02/2024   CL 104 04/02/2024   CO2 30 04/02/2024    Lab Results  Component Value Date   CHOL 70 11/16/2023   HDL 36.70 (L) 11/16/2023   LDLCALC 16 11/16/2023   TRIG 85.0 11/16/2023   CHOLHDL 2 11/16/2023    Medications Reviewed Today     Reviewed by Merceda Lela SAUNDERS, RPH (Pharmacist) on 04/19/24 at 1032  Med List Status: <None>   Medication Order Taking? Sig Documenting Provider Last Dose Status Informant  amLODipine  (NORVASC ) 10 MG tablet [id number] Yes Take 1 tablet (10 mg total) by mouth daily. Joshua Debby CROME, MD  Active   aspirin  EC 81 MG tablet [id number]  Take 81 mg by mouth daily. Swallow whole. [provider]  Active Self  atorvastatin  (LIPITOR) 80 MG tablet [id number] Yes TAKE 1 TABLET BY MOUTH AT BEDTIME Joshua Debby CROME, MD  Active   carvedilol  (COREG ) 6.25 MG tablet [id number] Yes TAKE 1 TABLET BY MOUTH TWICE DAILY WITH A MEAL Joshua Debby CROME, MD  Active   Continuous Glucose Sensor (FREESTYLE LIBRE 3 PLUS SENSOR) [place] [id number]  Apply 1 Act topically every 14 (fourteen) days. Change sensor every 15 days. Joshua Debby CROME, MD  Active   Glucagon  (GVOKE HYPOPEN  2-PACK) 1 MG/0.2ML EMMANUEL [id number]  Inject 1 Act into the skin daily as needed. Joshua,  Debby CROME, MD  Active            Med Note PINKY, BESSIE R   Fri Jan 19, 2024  7:15 AM) Has not needed  HYDROcodone -acetaminophen  (NORCO) 7.5-325 MG tablet [id number]  Take 1 tablet by mouth every 6 (six) hours as needed for moderate pain (pain score 4-6) or severe pain (pain score 7-10) (post-operatively). Alvaro Ricardo KATHEE Mickey., MD  Active   Insulin  Glargine-Lixisenatide  (SOLIQUA ) 100-33 UNT-MCG/ML NELMA [id number]  Yes Inject 20 Units into the skin daily. Joshua Debby CROME, MD  Active Self  thiamine  (VITAMIN B1) 100 MG tablet [id number]  Take 1 tablet (100 mg total) by mouth daily. Joshua Debby CROME, MD  Active               Assessment/Plan:   Diabetes: - Currently controlled; goal A1c <7%. Cardiorenal risk reduction is opportunities for improvement.. Blood pressure is not at goal <130/80. LDL is at goal.  - Reviewed long term cardiovascular and renal outcomes of uncontrolled diabetes - Continue Soliqua  20 units daily as A1c has been controlled for >2 years - ACE/ARB is indicated given UACR >30  Hypertension: - Currently uncontrolled, BP goal <130/80 - Reviewed long term cardiovascular and renal outcomes of uncontrolled blood pressure - Reviewed appropriate blood pressure monitoring technique and reviewed goal blood pressure. Recommended to check home blood pressure and heart rate with an arm monitor. Will provide patient with BP monitor when he comes in office on 9/30 The patient has a diagnosis of hypertension and it is medically necessary for them to have access to a home device to monitor blood pressure. The patient does not have readily available insurance access to a device and cannot afford to purchase a device at this time. The patient has been counseled that they do not need to continue to receive services from [facility] for receive a device. The patient will be given a device free of charge.   - Recommend to stop amlodipine  10 mg daily, start amlodipine -valsartan  5-160 mg daily. May need to increase dose if BP remains elevated.  - Continue carvedilol , however may be d/c in future if no diagnosis of CHF w/ reduced EF or arrhythmia  - Check BMP in 1-2 weeks   Follow Up Plan: PCP f/u 9/30  Darrelyn Drum, PharmD, BCPS, CPP Clinical Pharmacist Practitioner [facility] [phone]

## 2024-04-23 ENCOUNTER — Encounter: Payer: Self-pay | Admitting: Internal Medicine

## 2024-04-23 ENCOUNTER — Ambulatory Visit: Admitting: Internal Medicine

## 2024-04-23 VITALS — BP 146/76 | HR 62 | Temp 98.5°F | Resp 16 | Ht 66.0 in | Wt 228.0 lb

## 2024-04-23 DIAGNOSIS — Z794 Long term (current) use of insulin: Secondary | ICD-10-CM | POA: Diagnosis not present

## 2024-04-23 DIAGNOSIS — E119 Type 2 diabetes mellitus without complications: Secondary | ICD-10-CM | POA: Diagnosis not present

## 2024-04-23 DIAGNOSIS — Z23 Encounter for immunization: Secondary | ICD-10-CM | POA: Diagnosis not present

## 2024-04-23 DIAGNOSIS — I10 Essential (primary) hypertension: Secondary | ICD-10-CM | POA: Diagnosis not present

## 2024-04-23 NOTE — Progress Notes (Signed)
 Subjective:  Patient ID: Bradley Hull, male    DOB: [birth year]  Age: 73 y.o. MRN: [id number]  CC: Follow-up (3 month f/u. Patient is confused on why he is here when he was here 2 weeks ago. )   HPI [name] presents for f/up ---    Discussed the use of AI scribe software for clinical note transcription with the patient, who gave verbal consent to proceed.  History of Present Illness Bradley Hull is a 73 year old male who presents for follow-up after recent upper and lower endoscopy.  He recently underwent an upper and lower endoscopy. The lower endoscopy showed no significant issues. The upper endoscopy revealed an anterior hemorrhoid. He is awaiting further notification regarding a follow-up imaging study, possibly an MRI.  He experiences occasional abdominal pain and issues with bowel movements, which he attributes to opioid use. He reports constipation and describes his stool as dissolving quickly in water , indicating it is not solid. He manages his bowel movements with a hot cup of coffee and Metamucil in the morning, which helps him use the bathroom. He continues to take opioids, although the specific medication and dosage were not discussed.  No chest pain or shortness of breath during physical activity. He remains active, as evidenced by a recent incident where he was physically active in dealing with a snake near a chicken coop.  He mentions a past finding of a small hole in his heart, though no further details were provided. He has not had a flu shot yet this season.     Outpatient Medications Prior to Visit  Medication Sig Dispense Refill   amLODipine -valsartan  (EXFORGE ) 5-160 MG tablet Take 1 tablet by mouth daily. 90 tablet 0   aspirin  EC 81 MG tablet Take 81 mg by mouth daily. Swallow whole.     atorvastatin  (LIPITOR) 80 MG tablet TAKE 1 TABLET BY MOUTH AT BEDTIME 90 tablet 0   carvedilol  (COREG ) 6.25 MG tablet TAKE 1 TABLET BY MOUTH TWICE DAILY WITH A  MEAL 180 tablet 0   Continuous Glucose Sensor (FREESTYLE LIBRE 3 PLUS SENSOR) MISC Apply 1 Act topically every 14 (fourteen) days. Change sensor every 15 days. 6 each 1   Glucagon  (GVOKE HYPOPEN  2-PACK) 1 MG/0.2ML SOAJ Inject 1 Act into the skin daily as needed. 2 mL 5   HYDROcodone -acetaminophen  (NORCO) 7.5-325 MG tablet Take 1 tablet by mouth every 6 (six) hours as needed for moderate pain (pain score 4-6) or severe pain (pain score 7-10) (post-operatively). 30 tablet 0   Insulin  Glargine-Lixisenatide  (SOLIQUA ) 100-33 UNT-MCG/ML SOPN Inject 20 Units into the skin daily. 18 mL 0   thiamine  (VITAMIN B1) 100 MG tablet Take 1 tablet (100 mg total) by mouth daily. 90 tablet 0   No facility-administered medications prior to visit.    ROS Review of Systems  Constitutional: Negative.  Negative for appetite change, chills, diaphoresis, fatigue and fever.  HENT: Negative.  Negative for sore throat and trouble swallowing.   Eyes: Negative.   Respiratory: Negative.  Negative for cough, shortness of breath and wheezing.   Cardiovascular:  Negative for chest pain, palpitations and leg swelling.  Gastrointestinal:  Positive for constipation. Negative for abdominal pain, nausea and vomiting.  Endocrine: Negative.   Genitourinary: Negative.  Negative for difficulty urinating.  Musculoskeletal:  Positive for arthralgias. Negative for myalgias.  Skin: Negative.   Neurological: Negative.  Negative for dizziness and weakness.  Hematological:  Negative for adenopathy. Does not bruise/bleed  easily.  Psychiatric/Behavioral:  Positive for confusion and decreased concentration.     Objective:  BP (!) 146/76 (BP Location: Left Arm, Patient Position: Sitting, Cuff Size: Normal)   Pulse 62   Temp 98.5 F (36.9 C) (Oral)   Resp 16   Ht 5' 6 (1.676 m)   Wt 228 lb (103.4 kg)   SpO2 97%   BMI 36.80 kg/m   BP Readings from Last 3 Encounters:  04/23/24 (!) 146/76  04/09/24 (!) 140/66  04/02/24 138/78     Wt Readings from Last 3 Encounters:  04/23/24 228 lb (103.4 kg)  04/09/24 238 lb (108 kg)  04/02/24 232 lb 3.2 oz (105.3 kg)    Physical Exam Vitals reviewed.  Constitutional:      Appearance: Normal appearance.  HENT:     Nose: Nose normal.     Mouth/Throat:     Mouth: Mucous membranes are moist.  Eyes:     General: No scleral icterus.    Conjunctiva/sclera: Conjunctivae normal.  Cardiovascular:     Rate and Rhythm: Regular rhythm. Bradycardia present.     Heart sounds: No murmur heard.    No friction rub. No gallop.     Comments: EKG--- SB, 58 bpm LAD No LVH Low voltage in inferior and anterior leads is unchanged Pulmonary:     Effort: Pulmonary effort is normal.     Breath sounds: No stridor. No wheezing, rhonchi or rales.  Abdominal:     General: Abdomen is protuberant. Bowel sounds are normal. There is no distension.     Palpations: There is no hepatomegaly, splenomegaly or mass.     Tenderness: There is no abdominal tenderness. There is no guarding.  Musculoskeletal:        General: Normal range of motion.     Cervical back: Neck supple.     Right lower leg: No edema.     Left lower leg: No edema.  Lymphadenopathy:     Cervical: No cervical adenopathy.  Skin:    General: Skin is warm and dry.  Neurological:     General: No focal deficit present.     Mental Status: He is alert.  Psychiatric:        Mood and Affect: Mood normal.        Behavior: Behavior normal.     Lab Results  Component Value Date   WBC 5.8 04/02/2024   HGB 12.1 (L) 04/02/2024   HCT 36.4 (L) 04/02/2024   PLT 223.0 04/02/2024   GLUCOSE 89 04/02/2024   CHOL 70 11/16/2023   TRIG 85.0 11/16/2023   HDL 36.70 (L) 11/16/2023   LDLCALC 16 11/16/2023   ALT 17 01/12/2024   AST 16 01/12/2024   NA 140 04/02/2024   K 4.0 04/02/2024   CL 104 04/02/2024   CREATININE 0.77 04/02/2024   BUN 12 04/02/2024   CO2 30 04/02/2024   TSH 0.67 11/16/2023   PSA 0.1 06/26/2023   INR 1.1 (H)  11/16/2023   HGBA1C 6.9 (H) 04/02/2024   MICROALBUR 60.4 (H) 02/16/2024    No results found.  Assessment & Plan:   Insulin -requiring or dependent type II diabetes mellitus (HCC)- Blood sugar is well controlled. -     HM Diabetes Foot Exam  Need for immunization against influenza -     Flu vaccine HIGH DOSE PF(Fluzone Trivalent)  Essential hypertension- BP is adequately well controlled. -     EKG 12-Lead     Follow-up: Return in about 6 months (around  10/21/2024).  Debby Molt, MD

## 2024-04-23 NOTE — Patient Instructions (Signed)
 Bradycardia, Adult Bradycardia is a slower-than-normal heartbeat. A normal resting heart rate for an adult ranges from 60 to 100 beats per minute. With bradycardia, the resting heart rate is less than 60 beats per minute. Bradycardia can prevent enough oxygen  from reaching certain areas of your body when you are active. It can be serious if it keeps enough oxygen  from reaching your brain and other parts of your body. Bradycardia is not a problem for everyone. For some healthy adults, a slow resting heart rate is normal. What are the causes? This condition may be caused by: A problem with the heart, including: A problem with the heart's electrical system, such as a heart block. With a heart block, electrical signals between the chambers of the heart are partially or completely blocked, so they are not able to work as they should. A problem with the heart's natural pacemaker (sinus node). Heart disease. A heart attack. Heart damage. Lyme disease. A heart infection. A heart condition that is present at birth (congenital heart defect). Certain medicines that treat heart conditions. Certain conditions, such as hypothyroidism and obstructive sleep apnea. Problems with the balance of chemicals and other substances, like potassium, in the blood. Trauma. Radiation therapy. What increases the risk? You are more likely to develop this condition if you: Are age 30 or older. Have high blood pressure (hypertension), high cholesterol (hyperlipidemia), or diabetes. Drink heavily, use tobacco or nicotine products, or use drugs. What are the signs or symptoms? Symptoms of this condition include: Light-headedness. Feeling faint or fainting. Fatigue and weakness. Trouble with activity or exercise. Shortness of breath. Chest pain (angina). Drowsiness. Confusion. Dizziness. How is this diagnosed? This condition may be diagnosed based on: Your symptoms. Your medical history. A physical exam. During  the exam, your health care provider will listen to your heartbeat and check your pulse. To confirm the diagnosis, your health care provider may order tests, such as: Blood tests. An electrocardiogram (ECG). This test records the heart's electrical activity. The test can show how fast your heart is beating and whether the heartbeat is steady. A test in which you wear a portable device (event recorder or Holter monitor) to record your heart's electrical activity while you go about your day. An exercise test. How is this treated? Treatment for this condition depends on the cause of the condition and how severe your symptoms are. Treatment may involve: Treatment of the underlying condition. Changing your medicines or how much medicine you take. Having a small, battery-operated device called a pacemaker implanted under the skin. When bradycardia occurs, this device can be used to increase your heart rate and help your heart beat in a regular rhythm. Follow these instructions at home: Lifestyle Manage any health conditions that contribute to bradycardia as told by your health care provider. Follow a heart-healthy diet. A nutrition specialist (dietitian) can help educate you about healthy food options and changes. Follow an exercise program that is approved by your health care provider. Maintain a healthy weight. Try to reduce or manage your stress, such as with yoga or meditation. If you need help reducing stress, ask your health care provider. Do not use any products that contain nicotine or tobacco. These products include cigarettes, chewing tobacco, and vaping devices, such as e-cigarettes. If you need help quitting, ask your health care provider. Do not use illegal drugs. Alcohol  use If you drink alcohol : Limit how much you have to: 0-1 drink a day for women who are not pregnant. 0-2 drinks a day  for men. Know how much alcohol  is in a drink. In the [place] one drink equals one 12 oz bottle of  beer (355 mL), one 5 oz glass of wine (148 mL), or one 1 oz glass of hard liquor (44 mL). General instructions Take over-the-counter and prescription medicines only as told by your health care provider. Keep all follow-up visits. This is important. How is this prevented? In some cases, bradycardia may be prevented by: Treating underlying medical problems. Stopping behaviors or medicines that can trigger the condition. Contact a health care provider if: You feel light-headed or dizzy. You almost faint. You feel weak or are easily fatigued during physical activity. You experience confusion or have memory problems. Get help right away if: You faint. You have chest pains or an irregular heartbeat (palpitations). You have trouble breathing. These symptoms may represent a serious problem that is an emergency. Do not wait to see if the symptoms will go away. Get medical help right away. Call your local emergency services (911 in the [place] Do not drive yourself to the hospital. Summary Bradycardia is a slower-than-normal heartbeat. With bradycardia, the resting heart rate is less than 60 beats per minute. Treatment for this condition depends on the cause. Manage any health conditions that contribute to bradycardia as told by your health care provider. Do not use any products that contain nicotine or tobacco. These products include cigarettes, chewing tobacco, and vaping devices, such as e-cigarettes. Keep all follow-up visits. This is important. This information is not intended to replace advice given to you by your health care provider. Make sure you discuss any questions you have with your health care provider. Document Revised: 11/01/2020 Document Reviewed: 11/01/2020 Elsevier Patient Education  2024 [employer].

## 2024-04-26 DIAGNOSIS — R030 Elevated blood-pressure reading, without diagnosis of hypertension: Secondary | ICD-10-CM | POA: Diagnosis not present

## 2024-04-26 DIAGNOSIS — J019 Acute sinusitis, unspecified: Secondary | ICD-10-CM | POA: Diagnosis not present

## 2024-04-26 DIAGNOSIS — J309 Allergic rhinitis, unspecified: Secondary | ICD-10-CM | POA: Diagnosis not present

## 2024-04-29 DIAGNOSIS — Z79891 Long term (current) use of opiate analgesic: Secondary | ICD-10-CM | POA: Diagnosis not present

## 2024-04-29 DIAGNOSIS — G894 Chronic pain syndrome: Secondary | ICD-10-CM | POA: Diagnosis not present

## 2024-04-29 DIAGNOSIS — M542 Cervicalgia: Secondary | ICD-10-CM | POA: Diagnosis not present

## 2024-05-01 DIAGNOSIS — H43813 Vitreous degeneration, bilateral: Secondary | ICD-10-CM | POA: Diagnosis not present

## 2024-05-06 ENCOUNTER — Telehealth: Payer: Self-pay

## 2024-05-06 NOTE — Telephone Encounter (Signed)
 Copied from CRM [id number]. Topic: Clinical - Prescription Issue >> May 06, 2024 10:50 AM Harlene ORN wrote: Reason for CRM: [facility]  sent over the sensors for the freestyle Trevose plus. But the patient needs the actual reader.  phone:  [phone]

## 2024-05-07 ENCOUNTER — Other Ambulatory Visit: Payer: Self-pay | Admitting: Internal Medicine

## 2024-05-07 DIAGNOSIS — E119 Type 2 diabetes mellitus without complications: Secondary | ICD-10-CM

## 2024-05-08 ENCOUNTER — Other Ambulatory Visit: Payer: Self-pay | Admitting: Internal Medicine

## 2024-05-08 DIAGNOSIS — E119 Type 2 diabetes mellitus without complications: Secondary | ICD-10-CM

## 2024-05-08 MED ORDER — DEXCOM G7 RECEIVER DEVI: 1.0000 | Freq: Every day | 1 refills | Status: AC

## 2024-05-08 MED ORDER — DEXCOM G7 SENSOR MISC: 1.0000 | Freq: Every day | 1 refills | Status: AC

## 2024-05-08 NOTE — Telephone Encounter (Signed)
 Can you send the actual reader?

## 2024-05-08 NOTE — Telephone Encounter (Signed)
 Pt states that this will not work as he does not have a phone to read off can he have the old one

## 2024-05-08 NOTE — Telephone Encounter (Signed)
 The new one goes directly to the phone

## 2024-05-16 ENCOUNTER — Ambulatory Visit (HOSPITAL_BASED_OUTPATIENT_CLINIC_OR_DEPARTMENT_OTHER)
Admission: RE | Admit: 2024-05-16 | Discharge: 2024-05-16 | Disposition: A | Source: Ambulatory Visit | Attending: Gastroenterology | Admitting: Radiology

## 2024-05-16 ENCOUNTER — Encounter (HOSPITAL_BASED_OUTPATIENT_CLINIC_OR_DEPARTMENT_OTHER): Payer: Self-pay

## 2024-05-16 DIAGNOSIS — K7469 Other cirrhosis of liver: Secondary | ICD-10-CM

## 2024-05-16 DIAGNOSIS — I8500 Esophageal varices without bleeding: Secondary | ICD-10-CM

## 2024-05-16 MED ORDER — IOHEXOL 300 MG/ML  SOLN: 100.0000 mL | Freq: Once | INTRAMUSCULAR | Status: DC | PRN

## 2024-05-17 ENCOUNTER — Other Ambulatory Visit: Payer: Self-pay

## 2024-05-17 DIAGNOSIS — K7469 Other cirrhosis of liver: Secondary | ICD-10-CM

## 2024-05-17 DIAGNOSIS — I8500 Esophageal varices without bleeding: Secondary | ICD-10-CM

## 2024-05-21 ENCOUNTER — Ambulatory Visit (HOSPITAL_COMMUNITY)
Admission: RE | Admit: 2024-05-21 | Discharge: 2024-05-21 | Disposition: A | Discharge status: Home / Self Care | Source: Ambulatory Visit | Attending: Gastroenterology | Admitting: Gastroenterology

## 2024-05-21 DIAGNOSIS — I8500 Esophageal varices without bleeding: Secondary | ICD-10-CM | POA: Insufficient documentation

## 2024-05-21 DIAGNOSIS — K7469 Other cirrhosis of liver: Secondary | ICD-10-CM | POA: Insufficient documentation

## 2024-05-21 LAB — POCT I-STAT CREATININE: Creatinine, Ser: 1 mg/dL (ref 0.61–1.24)

## 2024-05-21 MED ORDER — IOHEXOL 300 MG/ML  SOLN
100.0000 mL | Freq: Once | INTRAMUSCULAR | Status: AC | PRN
  Administered 2024-05-21: 100 mL via INTRAVENOUS

## 2024-05-29 ENCOUNTER — Other Ambulatory Visit: Payer: Self-pay | Admitting: *Deleted

## 2024-05-29 ENCOUNTER — Ambulatory Visit: Payer: Self-pay | Admitting: Gastroenterology

## 2024-05-29 DIAGNOSIS — K7469 Other cirrhosis of liver: Secondary | ICD-10-CM

## 2024-05-29 DIAGNOSIS — K769 Liver disease, unspecified: Secondary | ICD-10-CM

## 2024-06-10 ENCOUNTER — Ambulatory Visit (HOSPITAL_COMMUNITY)
Admission: RE | Admit: 2024-06-10 | Discharge: 2024-06-10 | Disposition: A | Discharge status: Home / Self Care | Source: Ambulatory Visit | Attending: Gastroenterology | Admitting: Gastroenterology

## 2024-06-10 DIAGNOSIS — K769 Liver disease, unspecified: Secondary | ICD-10-CM | POA: Diagnosis present

## 2024-06-10 DIAGNOSIS — K7469 Other cirrhosis of liver: Secondary | ICD-10-CM | POA: Insufficient documentation

## 2024-06-10 MED ORDER — GADOBUTROL 1 MMOL/ML IV SOLN
10.0000 mL | Freq: Once | INTRAVENOUS | Status: AC | PRN
  Administered 2024-06-10: 10 mL via INTRAVENOUS

## 2024-06-14 ENCOUNTER — Ambulatory Visit: Payer: Self-pay | Admitting: Gastroenterology

## 2024-06-14 ENCOUNTER — Other Ambulatory Visit: Payer: Self-pay | Admitting: Internal Medicine

## 2024-06-14 DIAGNOSIS — I10 Essential (primary) hypertension: Secondary | ICD-10-CM

## 2024-06-14 NOTE — Telephone Encounter (Unsigned)
 Copied from CRM [id number]. Topic: Clinical - Medication Refill >> Jun 14, 2024  5:01 PM Shereese L wrote: Medication:  carvedilol  (COREG ) 6.25 MG tablet   Has the patient contacted their pharmacy? Yes (Agent: If no, request that the patient contact the pharmacy for the refill. If patient does not wish to contact the pharmacy document the reason why and proceed with request.) (Agent: If yes, when and what did the pharmacy advise?)  This is the patient's preferred pharmacy:  [facility] [address] [place] - [address] [place] [address] Phone: [phone] Fax: [phone]   Is this the correct pharmacy for this prescription? Yes If no, delete pharmacy and type the correct one.   Has the prescription been filled recently? Yes  Is the patient out of the medication? Yes  Has the patient been seen for an appointment in the last year OR does the patient have an upcoming appointment? Yes  Can we respond through MyChart? Yes  Agent: Please be advised that Rx refills may take up to 3 business days. We ask that you follow-up with your pharmacy.

## 2024-06-17 MED ORDER — CARVEDILOL 6.25 MG PO TABS
6.2500 mg | ORAL_TABLET | Freq: Two times a day (BID) | ORAL | 0 refills | Status: AC
Start: 2024-06-17 — End: ?

## 2024-06-18 ENCOUNTER — Encounter: Payer: Self-pay | Admitting: Podiatry

## 2024-06-18 ENCOUNTER — Ambulatory Visit (INDEPENDENT_AMBULATORY_CARE_PROVIDER_SITE_OTHER): Admitting: Podiatry

## 2024-06-18 DIAGNOSIS — E1140 Type 2 diabetes mellitus with diabetic neuropathy, unspecified: Secondary | ICD-10-CM | POA: Diagnosis not present

## 2024-06-18 DIAGNOSIS — B351 Tinea unguium: Secondary | ICD-10-CM

## 2024-06-18 DIAGNOSIS — M79674 Pain in right toe(s): Secondary | ICD-10-CM

## 2024-06-18 DIAGNOSIS — L84 Corns and callosities: Secondary | ICD-10-CM | POA: Diagnosis not present

## 2024-06-18 DIAGNOSIS — M79675 Pain in left toe(s): Secondary | ICD-10-CM

## 2024-06-18 NOTE — Progress Notes (Unsigned)
  Subjective:  Patient ID: Bradley Hull, male    DOB: 07-13-1951,  MRN: [id number]  Chief Complaint  Patient presents with   Diabetes    NP Diabetic foot care - A1C 6.9   Nail Problem    Thick, elongated, painful toenails - He is unable to trim himself   Callouses    Small painful porokeratosis left foot - behind ball of foot, almost in the arch    Discussed the use of AI scribe software for clinical note transcription with the patient, who gave verbal consent to proceed.  History of Present Illness Bradley Hull is a 73 year old male with diabetes who presents for foot care and management of calluses and nail issues.  He has a persistent small callus on the middle of his right foot that repeatedly returns after he sands it down with a battery-operated device. It is not painful but irritates him because it catches on his socks.  He has occasional numbness and tingling in his feet. His A1c has been around 6.9. He has not seen a podiatrist before.  He checks his feet daily and avoids walking barefoot.  He is interested in better dietary management to improve his diabetes control. His toenails grow quickly and are thickened, which he trims himself.      Objective:    Physical Exam EXTREMITIES: Good posterior tibial pulse bilaterally. Dorsalis pedis pulse faint bilaterally. Capillary refill to toes normal. 1+ edema to lower legs. Nail plates k89 bilaterally thickened, elongated, dystrophic, >15mm thickening, tender on palpation, mycotic. Small callus on middle of right foot.  MUSCULOSKELETAL: Muscle strength 5/5 for all major muscle groups. NEUROLOGICAL: Decreased vibratory sensation. Protective sensation decreased   No images are attached to the encounter.    Results LABS   A1c: 6.9%   Assessment:   1. Type 2 diabetes mellitus with diabetic neuropathy, unspecified whether long term insulin  use (HCC)   2. Pain due to onychomycosis of toenails of both feet   3. Callus       Plan:  Patient was evaluated and treated and all questions answered.  Assessment and Plan Assessment & Plan #Onychomycosis with pain  -Nails palliatively debrided as below. -Educated on self-care - Faint DP pulses, neuropathy  Procedure: Nail Debridement Rationale: Pain Type of Debridement: manual, sharp debridement. Instrumentation: Nail nipper, rotary burr. Number of Nails: 10   # Painful callus right plantar midfoot All symptomatic hyperkeratoses x1 were safely debrided with a sterile #15 blade to patient's level of comfort without incident. We discussed preventative and palliative care of these lesions including supportive and accommodative shoegear, padding, prefabricated and custom molded accommodative orthoses, use of a pumice stone and lotions/creams daily.   Type 2 diabetes mellitus with diabetic neuropathy A1c high sixes, good control. Neuropathy with decreased sensation in left foot. Emphasized importance of glycemic control. - Referred to a dietitian for dietary management. - Advised daily foot checks and avoiding walking barefoot. - Encouraged maintaining good glycemic control. -I certify that this diagnosis represents a distinct and separate diagnosis that requires evaluation and treatment separate from other procedures or diagnosis       Return in 3 months (on 09/18/2024) for Diabetic Foot Care.

## 2024-06-26 ENCOUNTER — Encounter: Payer: Self-pay | Admitting: Internal Medicine

## 2024-06-26 ENCOUNTER — Ambulatory Visit: Admitting: Internal Medicine

## 2024-06-26 VITALS — BP 128/68 | HR 69 | Temp 98.5°F | Resp 16 | Ht 66.0 in | Wt 231.0 lb

## 2024-06-26 DIAGNOSIS — E785 Hyperlipidemia, unspecified: Secondary | ICD-10-CM

## 2024-06-26 DIAGNOSIS — K7469 Other cirrhosis of liver: Secondary | ICD-10-CM

## 2024-06-26 DIAGNOSIS — Z794 Long term (current) use of insulin: Secondary | ICD-10-CM | POA: Diagnosis not present

## 2024-06-26 DIAGNOSIS — E119 Type 2 diabetes mellitus without complications: Secondary | ICD-10-CM | POA: Diagnosis not present

## 2024-06-26 DIAGNOSIS — I10 Essential (primary) hypertension: Secondary | ICD-10-CM

## 2024-06-26 MED ORDER — ATORVASTATIN CALCIUM 80 MG PO TABS: 80.0000 mg | ORAL_TABLET | Freq: Every day | ORAL | 0 refills | Status: AC

## 2024-06-26 NOTE — Patient Instructions (Signed)
 Hypertension, Adult High blood pressure (hypertension) is when the force of blood pumping through the arteries is too strong. The arteries are the blood vessels that carry blood from the heart throughout the body. Hypertension forces the heart to work harder to pump blood and may cause arteries to become narrow or stiff. Untreated or uncontrolled hypertension can lead to a heart attack, heart failure, a stroke, kidney disease, and other problems. A blood pressure reading consists of a higher number over a lower number. Ideally, your blood pressure should be below 120/80. The first ("top") number is called the systolic pressure. It is a measure of the pressure in your arteries as your heart beats. The second ("bottom") number is called the diastolic pressure. It is a measure of the pressure in your arteries as the heart relaxes. What are the causes? The exact cause of this condition is not known. There are some conditions that result in high blood pressure. What increases the risk? Certain factors may make you more likely to develop high blood pressure. Some of these risk factors are under your control, including: Smoking. Not getting enough exercise or physical activity. Being overweight. Having too much fat, sugar, calories, or salt (sodium) in your diet. Drinking too much alcohol. Other risk factors include: Having a personal history of heart disease, diabetes, high cholesterol, or kidney disease. Stress. Having a family history of high blood pressure and high cholesterol. Having obstructive sleep apnea. Age. The risk increases with age. What are the signs or symptoms? High blood pressure may not cause symptoms. Very high blood pressure (hypertensive crisis) may cause: Headache. Fast or irregular heartbeats (palpitations). Shortness of breath. Nosebleed. Nausea and vomiting. Vision changes. Severe chest pain, dizziness, and seizures. How is this diagnosed? This condition is diagnosed by  measuring your blood pressure while you are seated, with your arm resting on a flat surface, your legs uncrossed, and your feet flat on the floor. The cuff of the blood pressure monitor will be placed directly against the skin of your upper arm at the level of your heart. Blood pressure should be measured at least twice using the same arm. Certain conditions can cause a difference in blood pressure between your right and left arms. If you have a high blood pressure reading during one visit or you have normal blood pressure with other risk factors, you may be asked to: Return on a different day to have your blood pressure checked again. Monitor your blood pressure at home for 1 week or longer. If you are diagnosed with hypertension, you may have other blood or imaging tests to help your health care provider understand your overall risk for other conditions. How is this treated? This condition is treated by making healthy lifestyle changes, such as eating healthy foods, exercising more, and reducing your alcohol intake. You may be referred for counseling on a healthy diet and physical activity. Your health care provider may prescribe medicine if lifestyle changes are not enough to get your blood pressure under control and if: Your systolic blood pressure is above 130. Your diastolic blood pressure is above 80. Your personal target blood pressure may vary depending on your medical conditions, your age, and other factors. Follow these instructions at home: Eating and drinking  Eat a diet that is high in fiber and potassium, and low in sodium, added sugar, and fat. An example of this eating plan is called the DASH diet. DASH stands for Dietary Approaches to Stop Hypertension. To eat this way: Eat  plenty of fresh fruits and vegetables. Try to fill one half of your plate at each meal with fruits and vegetables. Eat whole grains, such as whole-wheat pasta, brown rice, or whole-grain bread. Fill about one  fourth of your plate with whole grains. Eat or drink low-fat dairy products, such as skim milk or low-fat yogurt. Avoid fatty cuts of meat, processed or cured meats, and poultry with skin. Fill about one fourth of your plate with lean proteins, such as fish, chicken without skin, beans, eggs, or tofu. Avoid pre-made and processed foods. These tend to be higher in sodium, added sugar, and fat. Reduce your daily sodium intake. Many people with hypertension should eat less than 1,500 mg of sodium a day. Do not drink alcohol if: Your health care provider tells you not to drink. You are pregnant, may be pregnant, or are planning to become pregnant. If you drink alcohol: Limit how much you have to: 0-1 drink a day for women. 0-2 drinks a day for men. Know how much alcohol is in your drink. In the [place] one drink equals one 12 oz bottle of beer (355 mL), one 5 oz glass of wine (148 mL), or one 1 oz glass of hard liquor (44 mL). Lifestyle  Work with your health care provider to maintain a healthy body weight or to lose weight. Ask what an ideal weight is for you. Get at least 30 minutes of exercise that causes your heart to beat faster (aerobic exercise) most days of the week. Activities may include walking, swimming, or biking. Include exercise to strengthen your muscles (resistance exercise), such as Pilates or lifting weights, as part of your weekly exercise routine. Try to do these types of exercises for 30 minutes at least 3 days a week. Do not use any products that contain nicotine or tobacco. These products include cigarettes, chewing tobacco, and vaping devices, such as e-cigarettes. If you need help quitting, ask your health care provider. Monitor your blood pressure at home as told by your health care provider. Keep all follow-up visits. This is important. Medicines Take over-the-counter and prescription medicines only as told by your health care provider. Follow directions carefully. Blood  pressure medicines must be taken as prescribed. Do not skip doses of blood pressure medicine. Doing this puts you at risk for problems and can make the medicine less effective. Ask your health care provider about side effects or reactions to medicines that you should watch for. Contact a health care provider if you: Think you are having a reaction to a medicine you are taking. Have headaches that keep coming back (recurring). Feel dizzy. Have swelling in your ankles. Have trouble with your vision. Get help right away if you: Develop a severe headache or confusion. Have unusual weakness or numbness. Feel faint. Have severe pain in your chest or abdomen. Vomit repeatedly. Have trouble breathing. These symptoms may be an emergency. Get help right away. Call 911. Do not wait to see if the symptoms will go away. Do not drive yourself to the hospital. Summary Hypertension is when the force of blood pumping through your arteries is too strong. If this condition is not controlled, it may put you at risk for serious complications. Your personal target blood pressure may vary depending on your medical conditions, your age, and other factors. For most people, a normal blood pressure is less than 120/80. Hypertension is treated with lifestyle changes, medicines, or a combination of both. Lifestyle changes include losing weight, eating a healthy,  low-sodium diet, exercising more, and limiting alcohol. This information is not intended to replace advice given to you by your health care provider. Make sure you discuss any questions you have with your health care provider. Document Revised: 05/18/2021 Document Reviewed: 05/18/2021 Elsevier Patient Education  2024 [employer].

## 2024-06-26 NOTE — Progress Notes (Signed)
 Subjective:  Patient ID: Bradley Hull, male    DOB: 10-19-1950  Age: 73 y.o. MRN: [id number]  CC: Hypertension, Diabetes, and Hyperlipidemia   HPI BREVON DEWALD presents for f/up ---   Discussed the use of AI scribe software for clinical note transcription with the patient, who gave verbal consent to proceed.  History of Present Illness Bradley Hull is a 73 year old male with hypertension and diabetes who presents with concerns about blood pressure and blood sugar control.  He is experiencing difficulty regulating his blood pressure with his current medication regimen, which includes carvedilol  and another unspecified large capsule. These medications are not effectively lowering his blood pressure. No symptoms such as weakness, dizziness, lightheadedness, headache, blurred vision, or swelling in his legs or feet.  He is facing challenges in controlling his blood sugar levels, which he attributes to dietary habits. He experiences frequent urination when his blood sugar is elevated.  He mentions irregular bowel movements, sometimes experiencing difficulty passing stools, which he associates with opioid use. He attempted a bowel movement this morning without success, with his last successful bowel movement being yesterday.  He recently visited a foot doctor who checked the sensation in his feet.     Outpatient Medications Prior to Visit  Medication Sig Dispense Refill   amLODipine -valsartan  (EXFORGE ) 5-160 MG tablet Take 1 tablet by mouth daily. 90 tablet 0   aspirin  EC 81 MG tablet Take 81 mg by mouth daily. Swallow whole.     carvedilol  (COREG ) 6.25 MG tablet Take 1 tablet (6.25 mg total) by mouth 2 (two) times daily with a meal. 180 tablet 0   Continuous Glucose Receiver (DEXCOM G7 RECEIVER) DEVI 1 Act by Does not apply route daily. 9 each 1   Continuous Glucose Sensor (DEXCOM G7 SENSOR) MISC 1 Act by Does not apply route daily. 9 each 1   Glucagon  (GVOKE HYPOPEN  2-PACK) 1  MG/0.2ML SOAJ Inject 1 Act into the skin daily as needed. 2 mL 5   HYDROcodone -acetaminophen  (NORCO) 7.5-325 MG tablet Take 1 tablet by mouth every 6 (six) hours as needed for moderate pain (pain score 4-6) or severe pain (pain score 7-10) (post-operatively). 30 tablet 0   Insulin  Glargine-Lixisenatide  (SOLIQUA ) 100-33 UNT-MCG/ML SOPN Inject 20 Units into the skin daily. 18 mL 0   nitrofurantoin, macrocrystal-monohydrate, (MACROBID) 100 MG capsule Take 100 mg by mouth 2 (two) times daily.     thiamine  (VITAMIN B1) 100 MG tablet Take 1 tablet (100 mg total) by mouth daily. 90 tablet 0   atorvastatin  (LIPITOR) 80 MG tablet TAKE 1 TABLET BY MOUTH AT BEDTIME 90 tablet 0   No facility-administered medications prior to visit.    ROS Review of Systems  Constitutional:  Negative for appetite change, chills, diaphoresis, fatigue and fever.  HENT: Negative.    Eyes: Negative.   Respiratory: Negative.  Negative for cough, chest tightness, shortness of breath and wheezing.   Cardiovascular:  Negative for chest pain, palpitations and leg swelling.  Gastrointestinal:  Positive for constipation. Negative for abdominal pain, blood in stool, nausea, rectal pain and vomiting.  Endocrine: Negative for polyuria.  Genitourinary:  Positive for frequency. Negative for difficulty urinating, dysuria and hematuria.  Musculoskeletal:  Positive for arthralgias and back pain. Negative for joint swelling and myalgias.  Neurological:  Negative for dizziness and weakness.  Hematological:  Does not bruise/bleed easily.  Psychiatric/Behavioral:  Positive for confusion and decreased concentration.     Objective:  BP 128/68 (BP Location: Left  Arm, Patient Position: Sitting, Cuff Size: Normal)   Pulse 69   Temp 98.5 F (36.9 C) (Oral)   Resp 16   Ht 5' 6 (1.676 m)   Wt 231 lb (104.8 kg)   SpO2 97%   BMI 37.28 kg/m   BP Readings from Last 3 Encounters:  06/26/24 128/68  04/23/24 (!) 146/76  04/09/24 (!) 140/66     Wt Readings from Last 3 Encounters:  06/26/24 231 lb (104.8 kg)  04/23/24 228 lb (103.4 kg)  04/09/24 238 lb (108 kg)    Physical Exam Vitals reviewed.  Constitutional:      General: He is not in acute distress.    Appearance: He is obese. He is not toxic-appearing or diaphoretic.  HENT:     Mouth/Throat:     Mouth: Mucous membranes are moist.  Eyes:     General: No scleral icterus.    Conjunctiva/sclera: Conjunctivae normal.  Cardiovascular:     Rate and Rhythm: Normal rate and regular rhythm.     Heart sounds: No murmur heard.    No friction rub. No gallop.  Pulmonary:     Effort: Pulmonary effort is normal.     Breath sounds: No stridor. No wheezing, rhonchi or rales.  Abdominal:     General: Abdomen is protuberant. Bowel sounds are normal. There is no distension.     Palpations: Abdomen is soft. There is no hepatomegaly, splenomegaly or mass.     Tenderness: There is no abdominal tenderness. There is no guarding or rebound.  Musculoskeletal:        General: Normal range of motion.     Cervical back: Neck supple.     Right lower leg: No edema.     Left lower leg: No edema.  Lymphadenopathy:     Cervical: No cervical adenopathy.  Skin:    General: Skin is warm and dry.  Neurological:     General: No focal deficit present.     Mental Status: He is alert.  Psychiatric:        Mood and Affect: Mood normal.        Behavior: Behavior normal.     Lab Results  Component Value Date   WBC 5.8 04/02/2024   HGB 12.1 (L) 04/02/2024   HCT 36.4 (L) 04/02/2024   PLT 223.0 04/02/2024   GLUCOSE 89 04/02/2024   CHOL 70 11/16/2023   TRIG 85.0 11/16/2023   HDL 36.70 (L) 11/16/2023   LDLCALC 16 11/16/2023   ALT 17 01/12/2024   AST 16 01/12/2024   NA 140 04/02/2024   K 4.0 04/02/2024   CL 104 04/02/2024   CREATININE 1.00 05/21/2024   BUN 12 04/02/2024   CO2 30 04/02/2024   TSH 0.67 11/16/2023   PSA 0.1 06/26/2023   INR 1.1 (H) 11/16/2023   HGBA1C 6.9 (H)  04/02/2024   MICROALBUR 60.4 (H) 02/16/2024    MR LIVER W WO CONTRAST Result Date: 06/14/2024 EXAM: MRI Abdomen with and without Contrast 06/10/2024 03:08:19 PM TECHNIQUE: Multiplanar multisequence MRI of the abdomen was performed with and without the administration of intravenous contrast. CONTRAST: 10 mL of Gadavist . COMPARISON: CT dated 05/21/2024. No prior MRI. CLINICAL HISTORY: Liver lesion, high cancer risk, other cirrhosis of liver, liver disease, unspecified. FINDINGS: LIVER: Moderate cirrhosis is evidenced by caudate lobe enlargement and irregular hepatic capsules. No suspicious hepatic observation identified. The CT abnormality is likely identified at 2 mm in the right hepatic lobe on image 34 / 18, too small to  characterize but not identified on other pulse sequences and of doubtful clinical significance. Mild motion degradation is present. The highest quality postcontrast series are 18 and 21. GALLBLADDER AND BILIARY SYSTEM: Gallbladder is unremarkable. No intrahepatic or extrahepatic ductal dilation. SPLEEN: Unremarkable. PANCREAS: Unremarkable. ADRENAL GLANDS: Right greater than left adrenal thickening, without dominant mass. Note, the adrenals are poorly evaluated on in and out of phase imaging. KIDNEYS: Multiple nonenhancing bilateral renal lesions, most consistent with cysts. The largest in the right kidney is in the upper pole at 3.6 cm. A dominant upper pole left renal lesion demonstrates minimal dependent precontrast T1 hyperintensity on image 46 / 11, but no postcontrast enhancement. This measures 3.1 cm. LYMPH NODES: No lymphadenopathy. VASCULATURE: Aortic atherosclerosis. Patent portal and splenic veins. No portal venous hypertension. PERITONEUM: No ascites. BOWEL: Grossly unremarkable. ABDOMINAL WALL: Right paraspinous lipoma within the lower chest at 4.0 cm on image 6 / 15. No acute abnormality. BONES: No acute abnormality. IMPRESSION: 1. Mild motion degradation. Given this limitation,  no suspicious liver lesion. 2. Cirrhosis without portal venous hypertension. 3. Multiple renal cysts and a minimally complex upper pole left renal cyst. No suspicious renal lesion. 4. Aortic atherosclerosis (ICD10-I70.0). Electronically signed by: Rockey Kilts MD 06/14/2024 01:43 PM EST RP Workstation: [id number]    Assessment & Plan:   Other cirrhosis of liver (HCC)- No evidence of decompensation.  Hyperlipidemia with target LDL less than 100 -     Atorvastatin  Calcium ; Take 1 tablet (80 mg total) by mouth at bedtime.  Dispense: 90 tablet; Refill: 0  Insulin -requiring or dependent type II diabetes mellitus (HCC)- Blood sugar is well controlled.  Essential hypertension- Blood pressure is well controlled.     Follow-up: Return in about 3 months (around 09/24/2024).  Debby Molt, MD

## 2024-07-05 NOTE — Progress Notes (Deleted)
°  Start: *** end: ***  Patient is here today ***. Patient would like to learn ***. Patient lives with ***.  *** shopping and cooking. Pt reports eating out *** times weekly.  Pt reports making the following changes including ***.  All Pt's questions were answered during this encounter.    History includes:  *** Medications include:  *** Labs noted:  ***  6.9, soliqua , G7

## 2024-07-08 ENCOUNTER — Telehealth: Payer: Self-pay | Admitting: Gastroenterology

## 2024-07-08 NOTE — Telephone Encounter (Signed)
 Pt stated that he has been having abdominal pain for several weeks now. Pt stated that he feels that it is coming from his hydrocodone  ( norco) Pt stated that he does feel that he has issues with constipation. Pt stated that his last BM was this AM after taking something OTC. Pt could not recall what he took but stated that he had a large  BM and the abdominal pain resolved.  Pt was notified with him taking Norco consistently that he could try a stool softer daily or  MiraLAX  daily to assist with the constipation.  Pt verbalized understanding with all questions answered.

## 2024-07-08 NOTE — Telephone Encounter (Signed)
 Incoming call from pt stating she is having discomfort in stomach after using hydrocortisone cream. Pt requesting advice for care. Please advise. Thank you.

## 2024-07-12 ENCOUNTER — Encounter: Admitting: Dietician

## 2024-07-29 ENCOUNTER — Other Ambulatory Visit: Payer: Self-pay | Admitting: Internal Medicine

## 2024-07-29 DIAGNOSIS — E119 Type 2 diabetes mellitus without complications: Secondary | ICD-10-CM

## 2024-08-23 ENCOUNTER — Other Ambulatory Visit: Payer: Self-pay | Admitting: Internal Medicine

## 2024-08-23 DIAGNOSIS — I10 Essential (primary) hypertension: Secondary | ICD-10-CM

## 2024-09-18 ENCOUNTER — Ambulatory Visit: Admitting: Podiatry

## 2025-02-21 ENCOUNTER — Ambulatory Visit
